# Patient Record
Sex: Male | Born: 1960 | Race: Black or African American | Hispanic: No | Marital: Married | Smoking: Former smoker
Health system: Southern US, Community
[De-identification: ages and names within clinical notes are randomized; demographics above are authoritative.]

## PROBLEM LIST (undated history)

## (undated) DIAGNOSIS — E119 Type 2 diabetes mellitus without complications: Secondary | ICD-10-CM

## (undated) DIAGNOSIS — F32A Depression, unspecified: Secondary | ICD-10-CM

## (undated) DIAGNOSIS — E669 Obesity, unspecified: Secondary | ICD-10-CM

## (undated) DIAGNOSIS — I2510 Atherosclerotic heart disease of native coronary artery without angina pectoris: Secondary | ICD-10-CM

## (undated) DIAGNOSIS — N471 Phimosis: Secondary | ICD-10-CM

## (undated) DIAGNOSIS — E785 Hyperlipidemia, unspecified: Secondary | ICD-10-CM

## (undated) DIAGNOSIS — Z9119 Patient's noncompliance with other medical treatment and regimen: Secondary | ICD-10-CM

## (undated) DIAGNOSIS — I5042 Chronic combined systolic (congestive) and diastolic (congestive) heart failure: Secondary | ICD-10-CM

## (undated) DIAGNOSIS — R079 Chest pain, unspecified: Secondary | ICD-10-CM

## (undated) DIAGNOSIS — F329 Major depressive disorder, single episode, unspecified: Secondary | ICD-10-CM

## (undated) DIAGNOSIS — I428 Other cardiomyopathies: Secondary | ICD-10-CM

## (undated) DIAGNOSIS — Z91199 Patient's noncompliance with other medical treatment and regimen due to unspecified reason: Secondary | ICD-10-CM

## (undated) DIAGNOSIS — I10 Essential (primary) hypertension: Secondary | ICD-10-CM

## (undated) DIAGNOSIS — Z87891 Personal history of nicotine dependence: Secondary | ICD-10-CM

## (undated) DIAGNOSIS — R0683 Snoring: Secondary | ICD-10-CM

## (undated) DIAGNOSIS — K219 Gastro-esophageal reflux disease without esophagitis: Secondary | ICD-10-CM

## (undated) DIAGNOSIS — G4733 Obstructive sleep apnea (adult) (pediatric): Secondary | ICD-10-CM

## (undated) HISTORY — DX: Hyperlipidemia, unspecified: E78.5

## (undated) HISTORY — PX: KNEE SURGERY: SHX244

## (undated) HISTORY — DX: Atherosclerotic heart disease of native coronary artery without angina pectoris: I25.10

## (undated) HISTORY — DX: Patient's noncompliance with other medical treatment and regimen: Z91.19

## (undated) HISTORY — DX: Type 2 diabetes mellitus without complications: E11.9

## (undated) HISTORY — DX: Chronic combined systolic (congestive) and diastolic (congestive) heart failure: I50.42

## (undated) HISTORY — DX: Patient's noncompliance with other medical treatment and regimen due to unspecified reason: Z91.199

## (undated) HISTORY — PX: TOTAL KNEE ARTHROPLASTY: SHX125

## (undated) HISTORY — PX: JOINT REPLACEMENT: SHX530

## (undated) HISTORY — DX: Other cardiomyopathies: I42.8

---
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
## 2008-04-28 ENCOUNTER — Encounter: Admission: RE | Admit: 2008-04-28 | Discharge: 2008-04-28 | Payer: Self-pay | Admitting: Occupational Medicine

## 2008-05-14 ENCOUNTER — Emergency Department (HOSPITAL_COMMUNITY): Admission: EM | Admit: 2008-05-14 | Discharge: 2008-05-14 | Payer: Self-pay | Admitting: Emergency Medicine

## 2008-06-10 ENCOUNTER — Emergency Department (HOSPITAL_COMMUNITY): Admission: EM | Admit: 2008-06-10 | Discharge: 2008-06-10 | Payer: Self-pay | Admitting: Emergency Medicine

## 2009-12-05 ENCOUNTER — Emergency Department (HOSPITAL_COMMUNITY): Admission: EM | Admit: 2009-12-05 | Discharge: 2009-12-05 | Payer: Self-pay | Admitting: Emergency Medicine

## 2011-03-21 LAB — URINALYSIS, ROUTINE W REFLEX MICROSCOPIC
Glucose, UA: NEGATIVE mg/dL
Ketones, ur: NEGATIVE mg/dL
Specific Gravity, Urine: 1.027 (ref 1.005–1.030)
pH: 6.5 (ref 5.0–8.0)

## 2011-12-09 ENCOUNTER — Ambulatory Visit (INDEPENDENT_AMBULATORY_CARE_PROVIDER_SITE_OTHER): Payer: BC Managed Care – PPO

## 2011-12-09 DIAGNOSIS — J019 Acute sinusitis, unspecified: Secondary | ICD-10-CM

## 2011-12-09 DIAGNOSIS — R05 Cough: Secondary | ICD-10-CM

## 2011-12-09 DIAGNOSIS — J111 Influenza due to unidentified influenza virus with other respiratory manifestations: Secondary | ICD-10-CM

## 2012-08-17 ENCOUNTER — Encounter (HOSPITAL_COMMUNITY): Payer: Self-pay

## 2012-08-17 ENCOUNTER — Emergency Department (INDEPENDENT_AMBULATORY_CARE_PROVIDER_SITE_OTHER): Payer: Self-pay

## 2012-08-17 ENCOUNTER — Emergency Department (INDEPENDENT_AMBULATORY_CARE_PROVIDER_SITE_OTHER)
Admission: EM | Admit: 2012-08-17 | Discharge: 2012-08-17 | Disposition: A | Discharge status: Home / Self Care | Payer: Worker's Compensation | Source: Home / Self Care | Attending: Emergency Medicine | Admitting: Emergency Medicine

## 2012-08-17 DIAGNOSIS — T148XXA Other injury of unspecified body region, initial encounter: Secondary | ICD-10-CM

## 2012-08-17 DIAGNOSIS — M1710 Unilateral primary osteoarthritis, unspecified knee: Secondary | ICD-10-CM

## 2012-08-17 HISTORY — DX: Essential (primary) hypertension: I10

## 2012-08-17 MED ORDER — HYDROCODONE-ACETAMINOPHEN 5-325 MG PO TABS
ORAL_TABLET | ORAL | Status: AC
  Filled 2012-08-17: qty 2

## 2012-08-17 MED ORDER — MELOXICAM 7.5 MG PO TABS: 7.5000 mg | ORAL_TABLET | Freq: Every day | ORAL | Status: AC

## 2012-08-17 MED ORDER — TRAMADOL HCL 50 MG PO TABS: 50.0000 mg | ORAL_TABLET | Freq: Four times a day (QID) | ORAL | Status: AC | PRN

## 2012-08-17 MED ORDER — HYDROCODONE-ACETAMINOPHEN 5-325 MG PO TABS
2.0000 | ORAL_TABLET | Freq: Once | ORAL | Status: AC
  Administered 2012-08-17: 2 via ORAL

## 2012-08-17 NOTE — ED Notes (Signed)
Discussion for W/C process

## 2012-08-17 NOTE — ED Provider Notes (Signed)
History     CSN: [id number]  Arrival date & time 08/17/12  1156   First MD Initiated Contact with Patient 08/17/12 1402      Chief Complaint  Patient presents with  . Knee Injury    (Consider location/radiation/quality/duration/timing/severity/associated sxs/prior treatment) HPI Comments: Patient presents  urgent care this afternoon, complaining of left anterior knee pain. He described that he was chasing a pupil at school today when he suddenly started feeling pain on his knee. Denies any falls, but believes he might have twisted his knee while he was almost running. Patient describes a throbbing constant pain (points to anterior and suprapatellar region). Denies any numbness, or weakness or his knee giving out, or popping noises or sensations. This has significant pain on weight-bearing activities, even for just walking.  The history is provided by the patient.    Past Medical History  Diagnosis Date  . Hypertension     Past Surgical History  Procedure Date  . Knee surgery     History reviewed. No pertinent family history.  History  Substance Use Topics  . Smoking status: Not on file  . Smokeless tobacco: Not on file  . Alcohol Use:       Review of Systems  Constitutional: Positive for activity change. Negative for fever, chills, diaphoresis and fatigue.  Cardiovascular: Negative for leg swelling.  Musculoskeletal: Negative for myalgias, back pain, joint swelling and arthralgias.       Left knee pain  Skin: Negative for color change, pallor and rash.  Neurological: Negative for dizziness, syncope, speech difficulty, light-headedness, numbness and headaches.    Allergies  Review of patient's allergies indicates no known allergies.  Home Medications   Current Outpatient Rx  Name Route Sig Dispense Refill  . MELOXICAM 7.5 MG PO TABS Oral Take 1 tablet (7.5 mg total) by mouth daily. 14 tablet 0  . TRAMADOL HCL 50 MG PO TABS Oral Take 1 tablet (50 mg total) by  mouth every 6 (six) hours as needed for pain. 15 tablet 0    BP 182/117  Pulse 66  Temp 98.1 F (36.7 C) (Oral)  Resp 20  SpO2 98%  Physical Exam  Nursing note and vitals reviewed. Constitutional: He appears well-developed. He appears distressed.  Abdominal: He exhibits no distension. There is no tenderness.  Musculoskeletal: He exhibits tenderness. He exhibits no edema.       Left knee: He exhibits bony tenderness. He exhibits normal range of motion, no swelling, no deformity, normal meniscus and no MCL laxity. tenderness found. Medial joint line, lateral joint line, MCL and LCL tenderness noted. No patellar tendon tenderness noted.       Legs: Neurological: He is alert.  Skin: Skin is warm. No rash noted. No erythema.    ED Course  Procedures (including critical care time)  Labs Reviewed - No data to display Dg Knee Complete 4 Views Left  08/17/2012  *RADIOLOGY REPORT*  Clinical Data: Injured left knee while running, pain and swelling. Remote left knee surgery.  LEFT KNEE - COMPLETE 4+ VIEW  Comparison: None.  Findings: No evidence of acute fracture or dislocation.  Moderate to severe medial compartment joint space narrowing, mild lateral patellofemoral joint space narrowing, and associated hypertrophic spurring.  Well-preserved bone mineral density.  No visible joint effusion.  IMPRESSION: No acute osseous abnormality.  Tricompartment osteoarthritis, worst in the medial compartment.   Original Report Authenticated By: Arnell Sieving, M.D.        MDM  Patient with  moderate to severe tricompartmental osteoarthritis. Reason for urgent care visit related to a recent sprain or strain at school. Patient has significant discomfort is provided a knee sleeve and crutches to avoid weight-bearing activities for the next 48 hours. Prescribe meloxicam course of 10-14 days and 10 tablets of Ultram for breakthrough pain management. I have discussed with patient that he needs to followup with  workers comp on Tuesday if discomfort prevents him from reintegrating to his work activity. Have advised him to consult with an orthopedic doctor pain was persistent in 2 weeks for further evaluation and imaging. Exam her x-rays were not suggestive of a significant meniscal or ligament tear although patient can have a minor intracompartmental associated injury.        Jimmie Molly, MD 08/17/12 [phone]

## 2012-08-17 NOTE — ED Notes (Addendum)
States he was chasing a pupil at school today, and injured left knee as a result: previous history of knee surgery

## 2012-08-21 ENCOUNTER — Ambulatory Visit (INDEPENDENT_AMBULATORY_CARE_PROVIDER_SITE_OTHER): Payer: BC Managed Care – PPO | Admitting: Internal Medicine

## 2012-08-21 VITALS — BP 172/102 | HR 81 | Temp 98.4°F | Resp 16 | Ht 68.0 in | Wt 226.0 lb

## 2012-08-21 DIAGNOSIS — M1710 Unilateral primary osteoarthritis, unspecified knee: Secondary | ICD-10-CM

## 2012-08-21 DIAGNOSIS — M25569 Pain in unspecified knee: Secondary | ICD-10-CM

## 2012-08-21 DIAGNOSIS — S83419A Sprain of medial collateral ligament of unspecified knee, initial encounter: Secondary | ICD-10-CM

## 2012-08-21 DIAGNOSIS — M1712 Unilateral primary osteoarthritis, left knee: Secondary | ICD-10-CM | POA: Insufficient documentation

## 2012-08-21 DIAGNOSIS — L909 Atrophic disorder of skin, unspecified: Secondary | ICD-10-CM

## 2012-08-21 DIAGNOSIS — I10 Essential (primary) hypertension: Secondary | ICD-10-CM | POA: Insufficient documentation

## 2012-08-21 DIAGNOSIS — L918 Other hypertrophic disorders of the skin: Secondary | ICD-10-CM

## 2012-08-21 DIAGNOSIS — Z6834 Body mass index (BMI) 34.0-34.9, adult: Secondary | ICD-10-CM | POA: Insufficient documentation

## 2012-08-21 DIAGNOSIS — R739 Hyperglycemia, unspecified: Secondary | ICD-10-CM

## 2012-08-21 LAB — POCT CBC
Lymph, poc: 2 (ref 0.6–3.4)
MCH, POC: 25.6 pg — AB (ref 27–31.2)
MCHC: 30.9 g/dL — AB (ref 31.8–35.4)
MCV: 82.9 fL (ref 80–97)
MID (cbc): 0.4 (ref 0–0.9)
POC LYMPH PERCENT: 38.6 %L (ref 10–50)
Platelet Count, POC: 252 10*3/uL (ref 142–424)
RDW, POC: 16.3 %
WBC: 5.3 10*3/uL (ref 4.6–10.2)

## 2012-08-21 MED ORDER — LISINOPRIL-HYDROCHLOROTHIAZIDE 10-12.5 MG PO TABS: 1.0000 | ORAL_TABLET | Freq: Every day | ORAL | Status: DC

## 2012-08-21 NOTE — Progress Notes (Signed)
10 skin tags removed with iris scissors.  Patient tolerated well. Hemostasis obtained with silver nitrate.

## 2012-08-21 NOTE — Progress Notes (Signed)
  Subjective:    Patient ID: Thomas Yoder, male    DOB: 09/15/1961, 51 y.o.   MRN: [id number]  HPI No medical followup for a few years//history of hypertension and anemia Recent injury to left knee at work and still limping  Blood pressure known to be out of control   Social history-Works at [employer]  Review of Systems Skin-numerous tags HEENT-no vision changes/no respiratory problems Cardiovascular respiratory-no dyspnea on exertion, chest pain, palpitations GI-negative GU negative     Objective:   Physical Exam Blood pressure 172/102 BMI 34 Skin with multiple skin tags around neck and in both axilla and down midline of back HEENT clear No thyromegaly or lymphadenopathy Lungs clear Heart regular without murmur Abdomen supple Extremities no edema/full peripheral pulses/The left knee is swollen but without an effusion. There is decreased range of motion secondary to discomfort. He is tender along the medial joint line and he is tender with stressing the medial joint. No laxity Neurological intact       Results for orders placed in visit on 08/21/12  POCT CBC      Component Value Range   WBC 5.3  4.6 - 10.2 K/uL   Lymph, poc 2.0  0.6 - 3.4   POC LYMPH PERCENT 38.6  10 - 50 %L   MID (cbc) 0.4  0 - 0.9   POC MID % 6.9  0 - 12 %M   POC Granulocyte 2.9  2 - 6.9   Granulocyte percent 54.5  37 - 80 %G   RBC 5.93  4.69 - 6.13 M/uL   Hemoglobin 15.2  14.1 - 18.1 g/dL   HCT, POC 40.9  81.1 - 53.7 %   MCV 82.9  80 - 97 fL   MCH, POC 25.6 (*) 27 - 31.2 pg   MCHC 30.9 (*) 31.8 - 35.4 g/dL   RDW, POC 91.4     Platelet Count, POC 252  142 - 424 K/uL   MPV 9.4  0 - 99.8 fL   Procedure-larger skin tags removed by PA Marte/referred for others Assessment & Plan:   1. Essential hypertension, benign  POCT CBC, Comprehensive metabolic panel, Lipid panel, PSA  2. Knee pain    3. Knee MCL sprain    4. Cutaneous skin tags  Ambulatory referral to Dermatology  5. BMI  34.0-34.9,adult    6. Knee osteoarthritis     Meds ordered this encounter  Medications  . lisinopril-hydrochlorothiazide (PRINZIDE,ZESTORETIC) 10-12.5 MG per tablet    Sig: Take 1 tablet by mouth daily.    Dispense:  90 tablet    Refill:  3   B. Start blood pressure medicine/ideal weight 160 pounds or less/home blood pressures-if not controlled a one-month followup to change medication Notify results of labs Review of knee x-rays from urgent care visit at [facility] suggested he has underlying osteoarthritis of the knee/If he doesn't get complete resolution of his primary sprain injury then he may need injections for osteoarthritis

## 2012-08-22 LAB — COMPREHENSIVE METABOLIC PANEL
AST: 18 U/L (ref 0–37)
Alkaline Phosphatase: 99 U/L (ref 39–117)
BUN: 16 mg/dL (ref 6–23)
Creat: 1.09 mg/dL (ref 0.50–1.35)
Glucose, Bld: 132 mg/dL — ABNORMAL HIGH (ref 70–99)

## 2012-08-22 LAB — LIPID PANEL
HDL: 31 mg/dL — ABNORMAL LOW (ref >39)
Total CHOL/HDL Ratio: 6.8 Ratio
Triglycerides: 531 mg/dL — ABNORMAL HIGH (ref <150)

## 2012-08-22 LAB — PSA: PSA: 1.32 ng/mL (ref <=4.00)

## 2012-08-22 NOTE — Addendum Note (Signed)
Addended by: Fernande Bras on: 08/22/2012 10:04 AM   Modules accepted: Orders

## 2012-08-27 ENCOUNTER — Encounter: Payer: Self-pay | Admitting: *Deleted

## 2013-09-30 ENCOUNTER — Ambulatory Visit (INDEPENDENT_AMBULATORY_CARE_PROVIDER_SITE_OTHER): Payer: BC Managed Care – PPO | Admitting: Internal Medicine

## 2013-09-30 ENCOUNTER — Ambulatory Visit: Payer: BC Managed Care – PPO

## 2013-09-30 VITALS — BP 164/108 | HR 79 | Temp 99.0°F | Resp 18

## 2013-09-30 DIAGNOSIS — R0602 Shortness of breath: Secondary | ICD-10-CM

## 2013-09-30 DIAGNOSIS — I10 Essential (primary) hypertension: Secondary | ICD-10-CM

## 2013-09-30 DIAGNOSIS — R079 Chest pain, unspecified: Secondary | ICD-10-CM

## 2013-09-30 LAB — POCT CBC
Granulocyte percent: 52.3 %G (ref 37–80)
MCV: 83.9 fL (ref 80–97)
MID (cbc): 0.3 (ref 0–0.9)
POC Granulocyte: 2.8 (ref 2–6.9)
Platelet Count, POC: 188 10*3/uL (ref 142–424)
RBC: 5.13 M/uL (ref 4.69–6.13)
RDW, POC: 15.4 %

## 2013-09-30 MED ORDER — MELOXICAM 15 MG PO TABS: 15.0000 mg | ORAL_TABLET | Freq: Every day | ORAL | Status: DC

## 2013-09-30 MED ORDER — AMOXICILLIN 500 MG PO CAPS: 1000.0000 mg | ORAL_CAPSULE | Freq: Two times a day (BID) | ORAL | Status: AC

## 2013-09-30 MED ORDER — KETOROLAC TROMETHAMINE 60 MG/2ML IM SOLN
60.0000 mg | Freq: Once | INTRAMUSCULAR | Status: AC
  Administered 2013-09-30: 60 mg via INTRAMUSCULAR

## 2013-09-30 MED ORDER — HYDROCODONE-HOMATROPINE 5-1.5 MG/5ML PO SYRP: 5.0000 mL | ORAL_SOLUTION | Freq: Four times a day (QID) | ORAL | Status: DC | PRN

## 2013-09-30 MED ORDER — LISINOPRIL-HYDROCHLOROTHIAZIDE 10-12.5 MG PO TABS: 1.0000 | ORAL_TABLET | Freq: Every day | ORAL | Status: DC

## 2013-09-30 NOTE — Progress Notes (Signed)
Subjective:    Patient ID: Thomas Yoder, male    DOB: [birth year], 52 y.o.   MRN: [id number]  HPIcalled by staff to room 7 for emergency--chest pain w/ sob. Found him in pain clutching chest and c/o sob- both present for 3 days intermittently. Has been working today until they convinced him to come here.  Pain is L ant chest and gets worse w/ cough , twisting, deep breathing. No diaphor/nausea/vom/diaphoresis Hx HTN-ran out of meds Exam/EKG suggested this was not emergent MI so w/u proceeded from that point  Has been coughing w/ sputum for 3 days . Started w/ cold/congestion last week. Fever yesterday. In bed this weeked tho hard to sleep due to chest pain. No st//  Patient Active Problem List   Diagnosis Date Noted  . Essential hypertension, benign 08/21/2012  . BMI 34.0-34.9,adult 08/21/2012  . Knee osteoarthritis 08/21/2012   Current outpatient prescriptions:  lisinopril-hydrochlorothiazide (PRINZIDE,ZESTORETIC) 10-12.5 MG per tablet, Take 1 tablet by mouth daily--OUT   Review of Systems  Constitutional: Negative for diaphoresis, activity change, appetite change and unexpected weight change.  HENT: Positive for postnasal drip, rhinorrhea, sinus pressure and sneezing.   Respiratory: Positive for cough, chest tightness and shortness of breath. Negative for choking and wheezing.   Cardiovascular: Positive for chest pain. Negative for palpitations and leg swelling.  Gastrointestinal: Negative for nausea and abdominal pain.  Musculoskeletal: Negative for joint swelling and neck pain.       Reports pain L scap border 2-3d as well       Objective:   Physical Exam  Constitutional: He is oriented to person, place, and time. He appears well-developed. He appears distressed.  Uncomfortable Given toradol 60 after nl ekg  HENT:  Mouth/Throat: Oropharynx is clear and moist.  Purulent sinus dr/tender max areas  Eyes: EOM are normal. Pupils are equal, round, and reactive to light. Right  eye exhibits no discharge. No scleral icterus.  Conj injected  Neck: No thyromegaly present.  Neck extens and flex exacerb pain L scap border  Cardiovascular: Normal rate, regular rhythm, normal heart sounds and intact distal pulses.  Exam reveals no gallop and no friction rub.   No murmur heard. Pulmonary/Chest: Effort normal and breath sounds normal. He has no wheezes. He has no rales. He exhibits no tenderness.  Pain w/ deep br L ant Tender to pal(pation L upper ant chest wall  Abdominal: Soft. Bowel sounds are normal. He exhibits no mass. There is no tenderness. There is no rebound and no guarding.  No HSmeg  Musculoskeletal: He exhibits no edema.  Lymphadenopathy:    He has no cervical adenopathy.  Neurological: He is alert and oriented to person, place, and time. No cranial nerve deficit.  Psychiatric: Judgment normal.  Apprehensive/uncomfortable   BP 164/108  Pulse 79  Temp(Src) 99 F (37.2 C) (Oral)  Resp 18  SpO2 98%    [facility] reading (PRIMARY) by  Dr. Doolittle=No active infiltrate  EKG no acute injury  Results for orders placed in visit on 09/30/13  POCT CBC      Result Value Range   WBC 5.3  4.6 - 10.2 K/uL   Lymph, poc 2.2  0.6 - 3.4   POC LYMPH PERCENT 41.5  10 - 50 %L   MID (cbc) 0.3  0 - 0.9   POC MID % 6.2  0 - 12 %M   POC Granulocyte 2.8  2 - 6.9   Granulocyte percent 52.3  37 - 80 %G   RBC  5.13  4.69 - 6.13 M/uL   Hemoglobin 13.4 (*) 14.1 - 18.1 g/dL   HCT, POC 14.7 (*) 82.9 - 53.7 %   MCV 83.9  80 - 97 fL   MCH, POC 26.1 (*) 27 - 31.2 pg   MCHC 31.2 (*) 31.8 - 35.4 g/dL   RDW, POC 56.2     Platelet Count, POC 188  142 - 424 K/uL   MPV 8.9  0 - 99.8 fL   reexam after labs/xray/ekg=asleep--much more comf 30 min p toradol  Assessment & Plan:  Chest pain -chest wall muscle tear SOB (shortness of breath) - due to above Sinusitis causing cough/fever Unspecified essential hypertension-uncontr/off meds Overweight  Meds ordered this encounter   Medications  . HYDROcodone-homatropine (HYCODAN) 5-1.5 MG/5ML syrup    Sig: Take 5 mLs by mouth every 6 (six) hours as needed for cough.    Dispense:  120 mL    Refill:  0  . amoxicillin (AMOXIL) 500 MG capsule    Sig: Take 2 capsules (1,000 mg total) by mouth 2 (two) times daily.    Dispense:  40 capsule    Refill:  0  . meloxicam (MOBIC) 15 MG tablet    Sig: Take 1 tablet (15 mg total) by mouth daily. For chest wall pain.    Dispense:  15 tablet    Refill:  0  . lisinopril-hydrochlorothiazide (PRINZIDE,ZESTORETIC) 10-12.5 MG per tablet    Sig: Take 1 tablet by mouth daily.    Dispense:  90 tablet    Refill:  3---------restarted  Reck 3-5 d sooner if worse Ck cmet Needs CPE

## 2013-10-01 ENCOUNTER — Encounter: Payer: Self-pay | Admitting: Internal Medicine

## 2013-10-01 LAB — COMPREHENSIVE METABOLIC PANEL
ALT: 14 U/L (ref 0–53)
AST: 15 U/L (ref 0–37)
Albumin: 3.9 g/dL (ref 3.5–5.2)
Alkaline Phosphatase: 95 U/L (ref 39–117)
BUN: 13 mg/dL (ref 6–23)
Calcium: 9.3 mg/dL (ref 8.4–10.5)
Chloride: 106 mEq/L (ref 96–112)
Potassium: 4.3 mEq/L (ref 3.5–5.3)
Sodium: 139 mEq/L (ref 135–145)
Total Protein: 6.6 g/dL (ref 6.0–8.3)

## 2013-10-03 ENCOUNTER — Emergency Department (HOSPITAL_COMMUNITY): Payer: BC Managed Care – PPO

## 2013-10-03 ENCOUNTER — Encounter (HOSPITAL_COMMUNITY): Payer: Self-pay | Admitting: Emergency Medicine

## 2013-10-03 ENCOUNTER — Ambulatory Visit (INDEPENDENT_AMBULATORY_CARE_PROVIDER_SITE_OTHER): Payer: BC Managed Care – PPO | Admitting: Emergency Medicine

## 2013-10-03 ENCOUNTER — Emergency Department (HOSPITAL_COMMUNITY)
Admission: EM | Admit: 2013-10-03 | Discharge: 2013-10-03 | Disposition: A | Discharge status: Home / Self Care | Payer: BC Managed Care – PPO | Attending: Emergency Medicine | Admitting: Emergency Medicine

## 2013-10-03 VITALS — BP 168/110 | HR 66 | Temp 98.1°F | Resp 18

## 2013-10-03 DIAGNOSIS — F17200 Nicotine dependence, unspecified, uncomplicated: Secondary | ICD-10-CM | POA: Insufficient documentation

## 2013-10-03 DIAGNOSIS — R0789 Other chest pain: Secondary | ICD-10-CM

## 2013-10-03 DIAGNOSIS — J029 Acute pharyngitis, unspecified: Secondary | ICD-10-CM | POA: Insufficient documentation

## 2013-10-03 DIAGNOSIS — IMO0001 Reserved for inherently not codable concepts without codable children: Secondary | ICD-10-CM | POA: Insufficient documentation

## 2013-10-03 DIAGNOSIS — Z792 Long term (current) use of antibiotics: Secondary | ICD-10-CM | POA: Insufficient documentation

## 2013-10-03 DIAGNOSIS — Z791 Long term (current) use of non-steroidal anti-inflammatories (NSAID): Secondary | ICD-10-CM | POA: Insufficient documentation

## 2013-10-03 DIAGNOSIS — R079 Chest pain, unspecified: Secondary | ICD-10-CM

## 2013-10-03 DIAGNOSIS — R0602 Shortness of breath: Secondary | ICD-10-CM

## 2013-10-03 DIAGNOSIS — J3489 Other specified disorders of nose and nasal sinuses: Secondary | ICD-10-CM | POA: Insufficient documentation

## 2013-10-03 DIAGNOSIS — J40 Bronchitis, not specified as acute or chronic: Secondary | ICD-10-CM

## 2013-10-03 DIAGNOSIS — J209 Acute bronchitis, unspecified: Secondary | ICD-10-CM | POA: Insufficient documentation

## 2013-10-03 DIAGNOSIS — I10 Essential (primary) hypertension: Secondary | ICD-10-CM | POA: Insufficient documentation

## 2013-10-03 DIAGNOSIS — Z79899 Other long term (current) drug therapy: Secondary | ICD-10-CM | POA: Insufficient documentation

## 2013-10-03 DIAGNOSIS — R9431 Abnormal electrocardiogram [ECG] [EKG]: Secondary | ICD-10-CM

## 2013-10-03 LAB — CBC WITH DIFFERENTIAL/PLATELET
Basophils Absolute: 0 10*3/uL (ref 0.0–0.1)
Basophils Relative: 1 % (ref 0–1)
Eosinophils Absolute: 0.1 10*3/uL (ref 0.0–0.7)
Eosinophils Relative: 2 % (ref 0–5)
HCT: 42.3 % (ref 39.0–52.0)
Hemoglobin: 14.3 g/dL (ref 13.0–17.0)
Lymphocytes Relative: 40 % (ref 12–46)
Lymphs Abs: 2.4 K/uL (ref 0.7–4.0)
MCH: 26.6 pg (ref 26.0–34.0)
MCHC: 33.8 g/dL (ref 30.0–36.0)
MCV: 78.8 fL (ref 78.0–100.0)
Monocytes Absolute: 0.4 K/uL (ref 0.1–1.0)
Monocytes Relative: 6 % (ref 3–12)
Neutro Abs: 3.1 10*3/uL (ref 1.7–7.7)
Neutrophils Relative %: 52 % (ref 43–77)
Platelets: 193 10*3/uL (ref 150–400)
RBC: 5.37 MIL/uL (ref 4.22–5.81)
RDW: 15.2 % (ref 11.5–15.5)
WBC: 6 K/uL (ref 4.0–10.5)

## 2013-10-03 LAB — BASIC METABOLIC PANEL
BUN: 16 mg/dL (ref 6–23)
Chloride: 105 mEq/L (ref 96–112)
GFR calc Af Amer: >90 mL/min (ref >90)
GFR calc non Af Amer: >90 mL/min (ref >90)
Potassium: 3.6 mEq/L (ref 3.5–5.1)
Sodium: 140 mEq/L (ref 135–145)

## 2013-10-03 LAB — BASIC METABOLIC PANEL WITH GFR
CO2: 24 meq/L (ref 19–32)
Calcium: 8.9 mg/dL (ref 8.4–10.5)
Creatinine, Ser: 0.79 mg/dL (ref 0.50–1.35)
Glucose, Bld: 87 mg/dL (ref 70–99)

## 2013-10-03 LAB — POCT I-STAT TROPONIN I: Troponin i, poc: 0 ng/mL (ref 0.00–0.08)

## 2013-10-03 MED ORDER — HYDROCOD POLST-CHLORPHEN POLST 10-8 MG/5ML PO LQCR
5.0000 mL | Freq: Once | ORAL | Status: AC
  Administered 2013-10-03: 5 mL via ORAL
  Filled 2013-10-03: qty 5

## 2013-10-03 NOTE — Discharge Instructions (Signed)
Bronchitis Bronchitis is the body's way of reacting to injury and/or infection (inflammation) of the bronchi. Bronchi are the air tubes that extend from the windpipe into the lungs. If the inflammation becomes severe, it may cause shortness of breath. CAUSES  Inflammation may be caused by:  A virus.  Germs (bacteria).  Dust.  Allergens.  Pollutants and many other irritants. The cells lining the bronchial tree are covered with tiny hairs (cilia). These constantly beat upward, away from the lungs, toward the mouth. This keeps the lungs free of pollutants. When these cells become too irritated and are unable to do their job, mucus begins to develop. This causes the characteristic cough of bronchitis. The cough clears the lungs when the cilia are unable to do their job. Without either of these protective mechanisms, the mucus would settle in the lungs. Then you would develop pneumonia. Smoking is a common cause of bronchitis and can contribute to pneumonia. Stopping this habit is the single most important thing you can do to help yourself. TREATMENT   Your caregiver may prescribe an antibiotic if the cough is caused by bacteria. Also, medicines that open up your airways make it easier to breathe. Your caregiver may also recommend or prescribe an expectorant. It will loosen the mucus to be coughed up. Only take over-the-counter or prescription medicines for pain, discomfort, or fever as directed by your caregiver.  Removing whatever causes the problem (smoking, for example) is critical to preventing the problem from getting worse.  Cough suppressants may be prescribed for relief of cough symptoms.  Inhaled medicines may be prescribed to help with symptoms now and to help prevent problems from returning.  For those with recurrent (chronic) bronchitis, there may be a need for steroid medicines. SEEK IMMEDIATE MEDICAL CARE IF:   During treatment, you develop more pus-like mucus (purulent  sputum).  You have a fever.  Your baby is older than 3 months with a rectal temperature of 102 F (38.9 C) or higher.  Your baby is 3 months old or younger with a rectal temperature of 100.4 F (38 C) or higher.  You become progressively more ill.  You have increased difficulty breathing, wheezing, or shortness of breath. It is necessary to seek immediate medical care if you are elderly or sick from any other disease. MAKE SURE YOU:   Understand these instructions.  Will watch your condition.  Will get help right away if you are not doing well or get worse. Document Released: 12/05/2005 Document Revised: 02/27/2012 Document Reviewed: 10/14/2008 Exit[employer] Patient Information 2014 [employer].  

## 2013-10-03 NOTE — ED Notes (Addendum)
Pt to ED for eval of CP onset last Friday (7 days ago) - L chest with radiation to jaw and back.  Went to PCP Tuesday and was dx with musculoskeletal pain and was given a shot which helped temporarily but then the pain returned. Today at work CP increases so his coworkers took him to [facility]. Also hypertensive with hx of same. Ran out of BP meds, recently got rx refilled but has not started taking them yet.  Last bp for ems 165/119 after 2 SL NTG, was initially 197/116. [facility] administered 324 ASA.

## 2013-10-03 NOTE — ED Provider Notes (Addendum)
CSN: [id number]     Arrival date & time 10/03/13  1153 History   First MD Initiated Contact with Patient 10/03/13 1201     Chief Complaint  Patient presents with  . Chest Pain  . Hypertension   (Consider location/radiation/quality/duration/timing/severity/associated sxs/prior Treatment) HPI Comments: Pt was seen by PCP Dr. Merla Riches and given Rx fro Mobic, amoxicillin and tussionex, but he did not fill Tussionex thinking only the Mobic was for pain.    Patient is a 52 y.o. male presenting with chest pain and hypertension. The history is provided by the patient and a relative.  Chest Pain Pain location:  L chest Pain quality: aching and sharp   Pain radiates to:  L shoulder Pain radiates to the back: no   Pain severity:  Moderate Onset quality:  Gradual Duration:  5 days Timing:  Intermittent Progression:  Worsening Chronicity:  New Relieved by:  Nothing Worsened by:  Movement Ineffective treatments: oral antibiotics and Mobic. Associated symptoms: cough   Associated symptoms: no abdominal pain, no fever, no nausea, no shortness of breath and not vomiting   Associated symptoms comment:  Nasal congestion, sinus pressure, sore throat Hypertension Associated symptoms include chest pain. Pertinent negatives include no abdominal pain and no shortness of breath.    Past Medical History  Diagnosis Date  . Hypertension    Past Surgical History  Procedure Laterality Date  . Knee surgery     History reviewed. No pertinent family history. History  Substance Use Topics  . Smoking status: Current Every Day Smoker  . Smokeless tobacco: Not on file  . Alcohol Use: Not on file    Review of Systems  Constitutional: Negative for fever and chills.  HENT: Positive for congestion, rhinorrhea, sinus pressure and sore throat.   Respiratory: Positive for cough. Negative for shortness of breath and wheezing.   Cardiovascular: Positive for chest pain.  Gastrointestinal: Negative for  nausea, vomiting, abdominal pain and diarrhea.  Genitourinary: Negative for dysuria and flank pain.  Musculoskeletal: Positive for myalgias.  All other systems reviewed and are negative.    Allergies  Review of patient's allergies indicates no known allergies.  Home Medications   Current Outpatient Rx  Name  Route  Sig  Dispense  Refill  . amoxicillin (AMOXIL) 500 MG capsule   Oral   Take 2 capsules (1,000 mg total) by mouth 2 (two) times daily.   40 capsule   0   . meloxicam (MOBIC) 15 MG tablet   Oral   Take 1 tablet (15 mg total) by mouth daily. For chest wall pain.   15 tablet   0   . HYDROcodone-homatropine (HYCODAN) 5-1.5 MG/5ML syrup   Oral   Take 5 mLs by mouth every 6 (six) hours as needed for cough.   120 mL   0   . lisinopril-hydrochlorothiazide (PRINZIDE,ZESTORETIC) 10-12.5 MG per tablet   Oral   Take 1 tablet by mouth daily.   90 tablet   3    BP 144/106  Pulse 76  Temp(Src) 97.7 F (36.5 C) (Oral)  Resp 12  SpO2 95% Physical Exam  Nursing note and vitals reviewed. Constitutional: He is oriented to person, place, and time. He appears well-developed and well-nourished. No distress.  HENT:  Head: Normocephalic and atraumatic.  Eyes: Conjunctivae and EOM are normal. No scleral icterus.  Neck: Normal range of motion. Neck supple. No JVD present.  Cardiovascular: Normal rate, regular rhythm and intact distal pulses.   No murmur heard. Pulmonary/Chest: Effort  normal. No respiratory distress. He has no wheezes. He has no rales. He exhibits tenderness.  Abdominal: Soft. Bowel sounds are normal. He exhibits no distension. There is no tenderness. There is no rebound.  Musculoskeletal: He exhibits no edema.  Neurological: He is alert and oriented to person, place, and time.  Skin: Skin is warm and dry. No rash noted. He is not diaphoretic.  Psychiatric: He has a normal mood and affect.    ED Course  Procedures (including critical care time) Labs  Review Labs Reviewed  CBC WITH DIFFERENTIAL  BASIC METABOLIC PANEL  POCT I-STAT TROPONIN I   Imaging Review Dg Chest 2 View  10/03/2013   CLINICAL DATA:  Left-sided chest pain and cough for 1 week.  EXAM: CHEST  2 VIEW  COMPARISON:  Single view of the chest 0 04/28/2008.  FINDINGS: The lungs are clear. Heart size is upper normal. No pneumothorax or pleural effusion. No focal bony abnormality.  IMPRESSION: No acute disease.   Electronically Signed   By: Drusilla Kanner M.D.   On: 10/03/2013 14:11    EKG Interpretation   None      ra sat is 96% and I interpret to be adequate  ECG at time 13:25 shows SR at rate 62, normal axis, non specific T wave abn's lateral leads, with more T wave inversions compared to ECG on 09/30/13.    2:57 PM I reviewed CXR 2 view, no infiltrates.  Labs are unremarkable, troponin is 0.00, and historically, seems highly atypical for ACS givne URI symptoms and sharp, intermittent CP's.  Recomnend getting tussionex filled and continuing current abx therapy, follow up with [facility] next week and will also refer to cardiology.    MDM   1. Bronchitis   2. Atypical chest pain     Pt with somewhat atypical and reproducible chest wall pain on left, no prior h/o cardiac, sharp pains, current ongoing URI symptoms and cough.  No sig PE risks.  Will get CXR, treat pain and obtain cardiac evaluation as well.  Pt's symptoms present for several days so serial troponins are not needed.        Gavin Pound. Yasamin Karel, MD 10/03/13 1500  Gavin Pound. Beryl Balz, MD 10/03/13 1501

## 2013-10-03 NOTE — Progress Notes (Signed)
  Subjective:    Patient ID: Thomas Yoder, male    DOB: 01/20/1961, 52 y.o.   MRN: [id number]  HPI patient states his pain started last Wednesday. It is a sharp pain which progresses to a throbbing type pain. The intensity varies between 5 and 10. He was seen 3 days ago. He was started on blood pressure medications. Chest x-ray showed no acute disease EKG showed nonspecific T changes. At work today he became short of breath with increasing pain on the left side to the point where he was about to cry. He states the pain is in the left lateral chest and extends up his left side of the back and into the neck area    Review of Systems     Objective:   Physical Exam patient is in no acute distress. His chest is clear. His heart is regular rate without murmurs. Abdomen is soft liver and spleen not large there are no areas of tenderness. Extremities are without edema  EKG shows lateral T-wave inversion with signs of left ventricular hypertrophy no ST elevation was seen       Assessment & Plan:  Go ahead and sent to the hospital for enzymes and d-dimer. His pulse ox here was good he had no signs of DVT. Previous chest x-ray was read as normal. I suspect he probably has underlying sleep apnea.

## 2014-02-12 ENCOUNTER — Ambulatory Visit: Payer: BC Managed Care – PPO

## 2014-02-12 ENCOUNTER — Ambulatory Visit (INDEPENDENT_AMBULATORY_CARE_PROVIDER_SITE_OTHER): Payer: BC Managed Care – PPO | Admitting: Emergency Medicine

## 2014-02-12 VITALS — BP 152/98 | HR 80 | Temp 98.2°F | Resp 17 | Ht 67.0 in | Wt 214.0 lb

## 2014-02-12 DIAGNOSIS — R002 Palpitations: Secondary | ICD-10-CM

## 2014-02-12 DIAGNOSIS — I10 Essential (primary) hypertension: Secondary | ICD-10-CM

## 2014-02-12 DIAGNOSIS — I517 Cardiomegaly: Secondary | ICD-10-CM

## 2014-02-12 LAB — COMPLETE METABOLIC PANEL WITH GFR
ALT: 12 U/L (ref 0–53)
AST: 12 U/L (ref 0–37)
Albumin: 4.1 g/dL (ref 3.5–5.2)
Alkaline Phosphatase: 86 U/L (ref 39–117)
BUN: 14 mg/dL (ref 6–23)
CO2: 27 mEq/L (ref 19–32)
CREATININE: 0.91 mg/dL (ref 0.50–1.35)
Calcium: 9.5 mg/dL (ref 8.4–10.5)
Chloride: 106 mEq/L (ref 96–112)
GFR, Est African American: >89 mL/min
GFR, Est Non African American: >89 mL/min
Glucose, Bld: 109 mg/dL — ABNORMAL HIGH (ref 70–99)
POTASSIUM: 4.5 meq/L (ref 3.5–5.3)
Sodium: 140 mEq/L (ref 135–145)
Total Bilirubin: 0.3 mg/dL (ref 0.2–1.2)
Total Protein: 6.8 g/dL (ref 6.0–8.3)

## 2014-02-12 LAB — POCT CBC
Granulocyte percent: 59.2 %G (ref 37–80)
HCT, POC: 48 % (ref 43.5–53.7)
HEMOGLOBIN: 14.5 g/dL (ref 14.1–18.1)
Lymph, poc: 2 (ref 0.6–3.4)
MCH, POC: 25.8 pg — AB (ref 27–31.2)
MCHC: 30.2 g/dL — AB (ref 31.8–35.4)
MCV: 85.3 fL (ref 80–97)
MID (CBC): 0.4 (ref 0–0.9)
MPV: 9.2 fL (ref 0–99.8)
POC GRANULOCYTE: 3.5 (ref 2–6.9)
POC LYMPH PERCENT: 33.8 %L (ref 10–50)
POC MID %: 7 % (ref 0–12)
Platelet Count, POC: 236 10*3/uL (ref 142–424)
RBC: 5.63 M/uL (ref 4.69–6.13)
RDW, POC: 17.3 %
WBC: 5.9 10*3/uL (ref 4.6–10.2)

## 2014-02-12 LAB — GLUCOSE, POCT (MANUAL RESULT ENTRY): POC GLUCOSE: 105 mg/dL — AB (ref 70–99)

## 2014-02-12 MED ORDER — LISINOPRIL-HYDROCHLOROTHIAZIDE 10-12.5 MG PO TABS: 1.0000 | ORAL_TABLET | Freq: Every day | ORAL | Status: DC

## 2014-02-12 MED ORDER — METOPROLOL SUCCINATE ER 50 MG PO TB24: 50.0000 mg | ORAL_TABLET | Freq: Every day | ORAL | Status: DC

## 2014-02-12 NOTE — Patient Instructions (Signed)
Smoking Cessation Quitting smoking is important to your health and has many advantages. However, it is not always easy to quit since nicotine is a very addictive drug. Often times, people try 3 times or more before being able to quit. This document explains the best ways for you to prepare to quit smoking. Quitting takes hard work and a lot of effort, but you can do it. ADVANTAGES OF QUITTING SMOKING  You will live longer, feel better, and live better.  Your body will feel the impact of quitting smoking almost immediately.  Within 20 minutes, blood pressure decreases. Your pulse returns to its normal level.  After 8 hours, carbon monoxide levels in the blood return to normal. Your oxygen level increases.  After 24 hours, the chance of having a heart attack starts to decrease. Your breath, hair, and body stop smelling like smoke.  After 48 hours, damaged nerve endings begin to recover. Your sense of taste and smell improve.  After 72 hours, the body is virtually free of nicotine. Your bronchial tubes relax and breathing becomes easier.  After 2 to 12 weeks, lungs can hold more air. Exercise becomes easier and circulation improves.  The risk of having a heart attack, stroke, cancer, or lung disease is greatly reduced.  After 1 year, the risk of coronary heart disease is cut in half.  After 5 years, the risk of stroke falls to the same as a nonsmoker.  After 10 years, the risk of lung cancer is cut in half and the risk of other cancers decreases significantly.  After 15 years, the risk of coronary heart disease drops, usually to the level of a nonsmoker.  If you are pregnant, quitting smoking will improve your chances of having a healthy baby.  The people you live with, especially any children, will be healthier.  You will have extra money to spend on things other than cigarettes. QUESTIONS TO THINK ABOUT BEFORE ATTEMPTING TO QUIT You may want to talk about your answers with your  caregiver.  Why do you want to quit?  If you tried to quit in the past, what helped and what did not?  What will be the most difficult situations for you after you quit? How will you plan to handle them?  Who can help you through the tough times? Your family? Friends? A caregiver?  What pleasures do you get from smoking? What ways can you still get pleasure if you quit? Here are some questions to ask your caregiver:  How can you help me to be successful at quitting?  What medicine do you think would be best for me and how should I take it?  What should I do if I need more help?  What is smoking withdrawal like? How can I get information on withdrawal? GET READY  Set a quit date.  Change your environment by getting rid of all cigarettes, ashtrays, matches, and lighters in your home, car, or work. Do not let people smoke in your home.  Review your past attempts to quit. Think about what worked and what did not. GET SUPPORT AND ENCOURAGEMENT You have a better chance of being successful if you have help. You can get support in many ways.  Tell your family, friends, and co-workers that you are going to quit and need their support. Ask them not to smoke around you.  Get individual, group, or telephone counseling and support. Programs are available at local hospitals and health centers. Call your local health department for   information about programs in your area.  Spiritual beliefs and practices may help some smokers quit.  Download a "quit meter" on your computer to keep track of quit statistics, such as how long you have gone without smoking, cigarettes not smoked, and money saved.  Get a self-help book about quitting smoking and staying off of tobacco. LEARN NEW SKILLS AND BEHAVIORS  Distract yourself from urges to smoke. Talk to someone, go for a walk, or occupy your time with a task.  Change your normal routine. Take a different route to work. Drink tea instead of coffee.  Eat breakfast in a different place.  Reduce your stress. Take a hot bath, exercise, or read a book.  Plan something enjoyable to do every day. Reward yourself for not smoking.  Explore interactive web-based programs that specialize in helping you quit. GET MEDICINE AND USE IT CORRECTLY Medicines can help you stop smoking and decrease the urge to smoke. Combining medicine with the above behavioral methods and support can greatly increase your chances of successfully quitting smoking.  Nicotine replacement therapy helps deliver nicotine to your body without the negative effects and risks of smoking. Nicotine replacement therapy includes nicotine gum, lozenges, inhalers, nasal sprays, and skin patches. Some may be available over-the-counter and others require a prescription.  Antidepressant medicine helps people abstain from smoking, but how this works is unknown. This medicine is available by prescription.  Nicotinic receptor partial agonist medicine simulates the effect of nicotine in your brain. This medicine is available by prescription. Ask your caregiver for advice about which medicines to use and how to use them based on your health history. Your caregiver will tell you what side effects to look out for if you choose to be on a medicine or therapy. Carefully read the information on the package. Do not use any other product containing nicotine while using a nicotine replacement product.  RELAPSE OR DIFFICULT SITUATIONS Most relapses occur within the first 3 months after quitting. Do not be discouraged if you start smoking again. Remember, most people try several times before finally quitting. You may have symptoms of withdrawal because your body is used to nicotine. You may crave cigarettes, be irritable, feel very hungry, cough often, get headaches, or have difficulty concentrating. The withdrawal symptoms are only temporary. They are strongest when you first quit, but they will go away within  10 14 days. To reduce the chances of relapse, try to:  Avoid drinking alcohol. Drinking lowers your chances of successfully quitting.  Reduce the amount of caffeine you consume. Once you quit smoking, the amount of caffeine in your body increases and can give you symptoms, such as a rapid heartbeat, sweating, and anxiety.  Avoid smokers because they can make you want to smoke.  Do not let weight gain distract you. Many smokers will gain weight when they quit, usually less than 10 pounds. Eat a healthy diet and stay active. You can always lose the weight gained after you quit.  Find ways to improve your mood other than smoking. FOR MORE INFORMATION  www.smokefree.gov  Document Released: 11/29/2001 Document Revised: 06/05/2012 Document Reviewed: 03/15/2012 [employer] Patient Information 2014 [employer].  

## 2014-02-12 NOTE — Progress Notes (Addendum)
Subjective:    Patient ID: Thomas Yoder, male    DOB: 08-02-1961, 53 y.o.   MRN: [id number] This chart was scribed for Collene Gobble, MD by Valera Castle, ED Scribe. This patient was seen in room 08 and the patient's care was started at 1:13 PM.  Chief Complaint  Patient presents with  . Hypertension  . Dizziness  . Chest Pain   HPI Thomas Yoder is a 53 y.o. male Pt presents for HTN follow up, recent dizziness. He takes Lisinopril-HCTZ.   He reports taking his BP medication regularly, but feels like his medication is not working. He denies having been seen by anyone since being sent to hospital at last visit here. He reports he was feeling better afterwards, started exercising. He states the last 4 days he has felt light-headed, weakness, and has had palpitations, irregular heart beat. He reports SOB, but denies chest pain.   He also reports intermittent, pinching, right, mid back pain. He reports having to lift frequently at work.  He reports h/o smoking, 1.5 ppweek. He is requesting to be put on medication for smoking.   Review of Systems  Cardiovascular: Positive for palpitations. Negative for chest pain.  Musculoskeletal: Positive for back pain (mid, right).  Neurological: Positive for dizziness, weakness and light-headedness.    Patient Active Problem List   Diagnosis Date Noted  . Essential hypertension, benign 08/21/2012  . BMI 34.0-34.9,adult 08/21/2012  . Knee osteoarthritis 08/21/2012   Past Medical History  Diagnosis Date  . Hypertension    Past Surgical History  Procedure Laterality Date  . Knee surgery     No Known Allergies Prior to Admission medications   Medication Sig Start Date End Date Taking? Authorizing Provider  lisinopril-hydrochlorothiazide (PRINZIDE,ZESTORETIC) 10-12.5 MG per tablet Take 1 tablet by mouth daily. 09/30/13 09/30/14 Yes Tonye Pearson, MD      Objective:   Physical Exam CONSTITUTIONAL: Well developed/well nourished HEAD:  Normocephalic/atraumatic EYES: EOMI/PERRL ENMT: Mucous membranes moist NECK: supple no meningeal signs SPINE: entire spine nontender CV: S1/S2 noted, no murmurs/rubs/gallops noted LUNGS: Lungs CTAB, No apparent distress ABDOMEN: soft, nontender, no rebound or guarding GU: NEURO: Pt is awake/alert, moves all extremitiesx4 EXTREMITIES: pulses normal, full ROM SKIN: warm, color normal PSYCH: no abnormalities of mood noted  BP 152/98  Pulse 80  Temp(Src) 98.2 F (36.8 C) (Oral)  Resp 17  Ht 5\' 7"  (1.702 m)  Wt 214 lb (97.07 kg)  BMI 33.51 kg/m2  SpO2 99%  BP RECHECK: RIGHT ARM: 170/110  Results for orders placed in visit on 02/12/14  POCT CBC      Result Value Ref Range   WBC 5.9  4.6 - 10.2 K/uL   Lymph, poc 2.0  0.6 - 3.4   POC LYMPH PERCENT 33.8  10 - 50 %L   MID (cbc) 0.4  0 - 0.9   POC MID % 7.0  0 - 12 %M   POC Granulocyte 3.5  2 - 6.9   Granulocyte percent 59.2  37 - 80 %G   RBC 5.63  4.69 - 6.13 M/uL   Hemoglobin 14.5  14.1 - 18.1 g/dL   HCT, POC 57.3  22.0 - 53.7 %   MCV 85.3  80 - 97 fL   MCH, POC 25.8 (*) 27 - 31.2 pg   MCHC 30.2 (*) 31.8 - 35.4 g/dL   RDW, POC 25.4     Platelet Count, POC 236  142 - 424 K/uL   MPV 9.2  0 - 99.8 fL  GLUCOSE, POCT (MANUAL RESULT ENTRY)      Result Value Ref Range   POC Glucose 105 (*) 70 - 99 mg/dl  EKG LVH no acute changes seen [facility] reading (PRIMARY) by  Dr. Cleta Albertsaub no acute disease      Assessment & Plan:  Marland Kitchen. Appointment made with cardiology for their evaluation. Added Toprol-XL 50 to take a half a day for the first 3 days and then one a day. I advised him to try Nicotrol patches. Referral was made and I will see the patient back in one month .   **Disclaimer: This note was dictated with voice recognition software. Similar sounding words can inadvertently be transcribed and this note may contain transcription errors which may not have been corrected upon publication of note.**   I personally performed the services  described in this documentation, which was scribed in my presence. The recorded information has been reviewed and is accurate.

## 2014-04-05 ENCOUNTER — Telehealth: Payer: Self-pay | Admitting: *Deleted

## 2014-04-05 NOTE — Telephone Encounter (Signed)
Left message with male to call back  Dr. Cleta Alberts wants pt to come in fasting and to see Dr. Cleta Alberts on either Mon. 745-12, Wed. 745-1, or Thurs. 745-1 for a fasting glucose.

## 2014-04-07 NOTE — Telephone Encounter (Signed)
LMVM to CB. 

## 2014-04-07 NOTE — Telephone Encounter (Signed)
Called Dr. Jacinto Halim office and left message for Dr. Jacinto Halim per Dr. Cleta Alberts did call patient but has not seen him yet.

## 2014-04-08 NOTE — Telephone Encounter (Signed)
Spoke with pt. He can't come in tomorrow or Thurs but he will be here Sat morning fasting

## 2014-04-28 ENCOUNTER — Encounter: Payer: Self-pay | Admitting: Emergency Medicine

## 2014-05-06 ENCOUNTER — Encounter (HOSPITAL_COMMUNITY): Payer: Self-pay

## 2014-05-06 ENCOUNTER — Ambulatory Visit (HOSPITAL_COMMUNITY): Admit: 2014-05-06 | Payer: BC Managed Care – PPO | Admitting: Cardiology

## 2014-05-06 SURGERY — LEFT HEART CATHETERIZATION WITH CORONARY ANGIOGRAM
Anesthesia: LOCAL

## 2014-09-18 ENCOUNTER — Ambulatory Visit (INDEPENDENT_AMBULATORY_CARE_PROVIDER_SITE_OTHER): Payer: BC Managed Care – PPO

## 2014-09-18 ENCOUNTER — Ambulatory Visit (INDEPENDENT_AMBULATORY_CARE_PROVIDER_SITE_OTHER): Payer: BC Managed Care – PPO | Admitting: Family Medicine

## 2014-09-18 VITALS — BP 170/112 | HR 75 | Temp 98.3°F | Resp 18 | Ht 68.75 in | Wt 225.0 lb

## 2014-09-18 DIAGNOSIS — J209 Acute bronchitis, unspecified: Secondary | ICD-10-CM

## 2014-09-18 DIAGNOSIS — Z720 Tobacco use: Secondary | ICD-10-CM

## 2014-09-18 DIAGNOSIS — I10 Essential (primary) hypertension: Secondary | ICD-10-CM

## 2014-09-18 DIAGNOSIS — R05 Cough: Secondary | ICD-10-CM

## 2014-09-18 DIAGNOSIS — F17200 Nicotine dependence, unspecified, uncomplicated: Secondary | ICD-10-CM

## 2014-09-18 DIAGNOSIS — R0602 Shortness of breath: Secondary | ICD-10-CM

## 2014-09-18 DIAGNOSIS — R059 Cough, unspecified: Secondary | ICD-10-CM

## 2014-09-18 DIAGNOSIS — R300 Dysuria: Secondary | ICD-10-CM

## 2014-09-18 LAB — POCT URINALYSIS DIPSTICK
BILIRUBIN UA: NEGATIVE
GLUCOSE UA: NEGATIVE
Ketones, UA: NEGATIVE
Leukocytes, UA: NEGATIVE
NITRITE UA: NEGATIVE
Protein, UA: NEGATIVE
Spec Grav, UA: 1.02
Urobilinogen, UA: 0.2
pH, UA: 5

## 2014-09-18 LAB — POCT CBC
Granulocyte percent: 61.1 %G (ref 37–80)
HCT, POC: 48.3 % (ref 43.5–53.7)
Hemoglobin: 15.2 g/dL (ref 14.1–18.1)
LYMPH, POC: 2.2 (ref 0.6–3.4)
MCH: 25.8 pg — AB (ref 27–31.2)
MCHC: 31.5 g/dL — AB (ref 31.8–35.4)
MCV: 81.7 fL (ref 80–97)
MID (cbc): 0.2 (ref 0–0.9)
MPV: 8.7 fL (ref 0–99.8)
PLATELET COUNT, POC: 187 10*3/uL (ref 142–424)
POC Granulocyte: 3.7 (ref 2–6.9)
POC LYMPH PERCENT: 36.4 %L (ref 10–50)
POC MID %: 2.5 %M (ref 0–12)
RBC: 5.91 M/uL (ref 4.69–6.13)
RDW, POC: 17.5 %
WBC: 6 10*3/uL (ref 4.6–10.2)

## 2014-09-18 LAB — POCT UA - MICROSCOPIC ONLY
BACTERIA, U MICROSCOPIC: NEGATIVE
Casts, Ur, LPF, POC: NEGATIVE
Crystals, Ur, HPF, POC: NEGATIVE
Mucus, UA: NEGATIVE
RBC, urine, microscopic: NEGATIVE
WBC, UR, HPF, POC: NEGATIVE
Yeast, UA: NEGATIVE

## 2014-09-18 LAB — POCT INFLUENZA A/B
INFLUENZA A, POC: NEGATIVE
INFLUENZA B, POC: NEGATIVE

## 2014-09-18 MED ORDER — IPRATROPIUM-ALBUTEROL 20-100 MCG/ACT IN AERS: 1.0000 | INHALATION_SPRAY | Freq: Four times a day (QID) | RESPIRATORY_TRACT | Status: DC

## 2014-09-18 MED ORDER — IPRATROPIUM BROMIDE 0.02 % IN SOLN
0.5000 mg | Freq: Once | RESPIRATORY_TRACT | Status: AC
  Administered 2014-09-18: 0.5 mg via RESPIRATORY_TRACT

## 2014-09-18 MED ORDER — HYDROCOD POLST-CHLORPHEN POLST 10-8 MG/5ML PO LQCR: 5.0000 mL | Freq: Two times a day (BID) | ORAL | Status: DC | PRN

## 2014-09-18 MED ORDER — AZITHROMYCIN 500 MG PO TABS: 500.0000 mg | ORAL_TABLET | Freq: Every day | ORAL | Status: DC

## 2014-09-18 MED ORDER — GUAIFENESIN ER 1200 MG PO TB12: 1.0000 | ORAL_TABLET | Freq: Two times a day (BID) | ORAL | Status: DC | PRN

## 2014-09-18 MED ORDER — ALBUTEROL SULFATE (2.5 MG/3ML) 0.083% IN NEBU
2.5000 mg | INHALATION_SOLUTION | Freq: Once | RESPIRATORY_TRACT | Status: AC
  Administered 2014-09-18: 2.5 mg via RESPIRATORY_TRACT

## 2014-09-18 NOTE — Patient Instructions (Signed)

## 2014-09-18 NOTE — Progress Notes (Signed)
Subjective:    Patient ID: Thomas Yoder, male    DOB: 09-03-1961, 53 y.o.   MRN: [id number] Chief Complaint  Patient presents with  . sinus congestion    x3 days   . Chest Pain    tightness  . Chills  . Cough    HPI   Got ill 4d ago and has been out of work for the past 3d.  Had severe nasal congestion requiring mouth breathing with sinus pressure. He had severe rhinitis and developed pharyngitis. Then he developed chills and body aches. Has been using nyquil with partial relief - at least helps him get some sleep.  +HAs, no ear pain. Has now developed sneezing and cough for the 3d. Cough productive of clear thick mucous.  Has been ShoB with wheezing, chest tightness which is constant.  No n/v.  Increased frequency of urination and diarrhea.  No abd pain.  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  Is smoking 1 pack/wk.  Past Medical History  Diagnosis Date  . Hypertension    Current Outpatient Prescriptions on File Prior to Visit  Medication Sig Dispense Refill  . lisinopril-hydrochlorothiazide (PRINZIDE,ZESTORETIC) 10-12.5 MG per tablet Take 1 tablet by mouth daily.  90 tablet  3  . metoprolol succinate (TOPROL-XL) 50 MG 24 hr tablet Take 1 tablet (50 mg total) by mouth daily. Take with or immediately following a meal.  90 tablet  3   No current facility-administered medications on file prior to visit.   No Known Allergies  Review of Systems  Constitutional: Positive for chills, diaphoresis, activity change, appetite change and fatigue. Negative for fever and unexpected weight change.  HENT: Positive for congestion, postnasal drip, rhinorrhea, sinus pressure, sneezing and sore throat. Negative for ear pain and mouth sores.   Respiratory: Positive for cough, chest tightness and wheezing. Negative for shortness of breath and stridor.   Cardiovascular: Positive for chest pain.  Gastrointestinal: Positive for diarrhea. Negative for nausea, vomiting, abdominal pain  and constipation.  Genitourinary: Positive for frequency. Negative for dysuria.  Musculoskeletal: Positive for arthralgias and myalgias. Negative for neck pain and neck stiffness.  Skin: Negative for rash.  Neurological: Positive for headaches. Negative for syncope.  Hematological: Negative for adenopathy.  Psychiatric/Behavioral: Positive for sleep disturbance.       Objective:  BP 170/112  Pulse 75  Temp(Src) 98.3 F (36.8 C) (Oral)  Resp 18  Ht 5' 8.75" (1.746 m)  Wt 225 lb (102.059 kg)  BMI 33.48 kg/m2  SpO2 99%  Physical Exam  Constitutional: He is oriented to person, place, and time. He appears well-developed and well-nourished. No distress.  HENT:  Head: Normocephalic and atraumatic.    Right Ear: External ear and ear canal normal. Tympanic membrane is erythematous.  Left Ear: External ear and ear canal normal. Tympanic membrane is erythematous.  Nose: Mucosal edema and rhinorrhea present.  Mouth/Throat: Uvula is midline and mucous membranes are normal. No trismus in the jaw. No uvula swelling. Posterior oropharyngeal erythema present. No oropharyngeal exudate or posterior oropharyngeal edema.  Eyes: Conjunctivae are normal. Right eye exhibits no discharge. Left eye exhibits no discharge. No scleral icterus.  Neck: Normal range of motion. Neck supple. No thyromegaly present.  Cardiovascular: Normal rate, regular rhythm, normal heart sounds and intact distal pulses.   Pulmonary/Chest: Effort normal. No respiratory distress. He has decreased breath sounds. He has rhonchi in the right lower field and the left lower field.  Abdominal: Soft. Normal appearance and bowel  sounds are normal. There is no hepatosplenomegaly. There is generalized tenderness. There is no rigidity, no rebound, no guarding and no CVA tenderness.  Lymphadenopathy:       Head (right side): Submandibular and tonsillar adenopathy present.       Head (left side): Submandibular and tonsillar adenopathy  present.    He has no cervical adenopathy.       Right: No supraclavicular adenopathy present.       Left: No supraclavicular adenopathy present.  Neurological: He is alert and oriented to person, place, and time.  Skin: Skin is warm and dry. He is not diaphoretic. No erythema.  Psychiatric: He has a normal mood and affect. His behavior is normal.   [facility] reading (PRIMARY) by  Dr. Clelia Croft.  CXR:.Increased bibasilar infiltrates with more prominent LLL infiltrate retrocardiac  EXAM: CHEST 2 VIEW  COMPARISON: 02/12/2014  FINDINGS: Low lung volumes are present, causing crowding of the pulmonary vasculature. Thoracic spondylosis. Mildly enlarged cardiopericardial silhouette.  Airway thickening is present, suggesting bronchitis or reactive airways disease. No airspace opacity identified.  IMPRESSION: 1. Mildly enlarged cardiopericardial silhouette, without edema. 2. Airway thickening is present, suggesting bronchitis or reactive airways disease.   S/p duoneb pulmonary exam somewhat improved but still w/ decreased breath sounds throughout.  Peak flow: 400 predicted peak flow: 512; s/p duoneb peak flow 490    Results for orders placed in visit on 09/18/14  POCT CBC      Result Value Ref Range   WBC 6.0  4.6 - 10.2 K/uL   Lymph, poc 2.2  0.6 - 3.4   POC LYMPH PERCENT 36.4  10 - 50 %L   MID (cbc) 0.2  0 - 0.9   POC MID % 2.5  0 - 12 %M   POC Granulocyte 3.7  2 - 6.9   Granulocyte percent 61.1  37 - 80 %G   RBC 5.91  4.69 - 6.13 M/uL   Hemoglobin 15.2  14.1 - 18.1 g/dL   HCT, POC 16.1  09.6 - 53.7 %   MCV 81.7  80 - 97 fL   MCH, POC 25.8 (*) 27 - 31.2 pg   MCHC 31.5 (*) 31.8 - 35.4 g/dL   RDW, POC 04.5     Platelet Count, POC 187  142 - 424 K/uL   MPV 8.7  0 - 99.8 fL  POCT INFLUENZA A/B      Result Value Ref Range   Influenza A, POC Negative     Influenza B, POC Negative    POCT UA - MICROSCOPIC ONLY      Result Value Ref Range   WBC, Ur, HPF, POC Neg     RBC, urine,  microscopic Neg     Bacteria, U Microscopic Neg     Mucus, UA Neg     Epithelial cells, urine per micros 0-1     Crystals, Ur, HPF, POC Neg     Casts, Ur, LPF, POC Neg     Yeast, UA Neg    POCT URINALYSIS DIPSTICK      Result Value Ref Range   Color, UA Yellow     Clarity, UA Clear     Glucose, UA Neg     Bilirubin, UA Neg     Ketones, UA Neg     Spec Grav, UA 1.020     Blood, UA Trace-lysed     pH, UA 5.0     Protein, UA Neg     Urobilinogen, UA 0.2  Nitrite, UA Neg     Leukocytes, UA Negative      Assessment & Plan:  No decongestants due to BP. Recheck in 3d before he goes back to work  Essential hypertension, benign - Plan: DG Chest 2 View, POCT CBC, POCT Influenza A/B, albuterol (PROVENTIL) (2.5 MG/3ML) 0.083% nebulizer solution 2.5 mg, ipratropium (ATROVENT) nebulizer solution 0.5 mg  Tobacco use disorder - Plan: DG Chest 2 View, POCT CBC, POCT Influenza A/B, albuterol (PROVENTIL) (2.5 MG/3ML) 0.083% nebulizer solution 2.5 mg, ipratropium (ATROVENT) nebulizer solution 0.5 mg  Cough - Plan: DG Chest 2 View, POCT CBC, POCT Influenza A/B, albuterol (PROVENTIL) (2.5 MG/3ML) 0.083% nebulizer solution 2.5 mg, ipratropium (ATROVENT) nebulizer solution 0.5 mg  Shortness of breath - Plan: DG Chest 2 View, POCT CBC, POCT Influenza A/B, albuterol (PROVENTIL) (2.5 MG/3ML) 0.083% nebulizer solution 2.5 mg, ipratropium (ATROVENT) nebulizer solution 0.5 mg  Dysuria - Plan: POCT UA - Microscopic Only, POCT urinalysis dipstick  Acute bronchitis, unspecified organism  Meds ordered this encounter  Medications  . albuterol (PROVENTIL) (2.5 MG/3ML) 0.083% nebulizer solution 2.5 mg    Sig:   . ipratropium (ATROVENT) nebulizer solution 0.5 mg    Sig:   . azithromycin (ZITHROMAX) 500 MG tablet    Sig: Take 1 tablet (500 mg total) by mouth daily.    Dispense:  3 tablet    Refill:  0  . chlorpheniramine-HYDROcodone (TUSSIONEX PENNKINETIC ER) 10-8 MG/5ML LQCR    Sig: Take 5 mLs by mouth  every 12 (twelve) hours as needed.    Dispense:  120 mL    Refill:  0  . Ipratropium-Albuterol (COMBIVENT RESPIMAT) 20-100 MCG/ACT AERS respimat    Sig: Inhale 1 puff into the lungs every 6 (six) hours.    Dispense:  4 g    Refill:  1  . Guaifenesin (MUCINEX MAXIMUM STRENGTH) 1200 MG TB12    Sig: Take 1 tablet (1,200 mg total) by mouth every 12 (twelve) hours as needed.    Dispense:  14 tablet    Refill:  1     Norberto SorensonEva Dantrell Schertzer, MD MPH

## 2015-02-28 ENCOUNTER — Encounter (HOSPITAL_COMMUNITY): Payer: Self-pay | Admitting: Emergency Medicine

## 2015-02-28 ENCOUNTER — Emergency Department (HOSPITAL_COMMUNITY)
Admission: EM | Admit: 2015-02-28 | Discharge: 2015-02-28 | Disposition: A | Discharge status: Home / Self Care | Payer: BC Managed Care – PPO | Attending: Emergency Medicine | Admitting: Emergency Medicine

## 2015-02-28 DIAGNOSIS — I10 Essential (primary) hypertension: Secondary | ICD-10-CM | POA: Insufficient documentation

## 2015-02-28 DIAGNOSIS — Z79899 Other long term (current) drug therapy: Secondary | ICD-10-CM | POA: Diagnosis not present

## 2015-02-28 DIAGNOSIS — S81012A Laceration without foreign body, left knee, initial encounter: Secondary | ICD-10-CM | POA: Insufficient documentation

## 2015-02-28 DIAGNOSIS — Z792 Long term (current) use of antibiotics: Secondary | ICD-10-CM | POA: Insufficient documentation

## 2015-02-28 DIAGNOSIS — Y288XXA Contact with other sharp object, undetermined intent, initial encounter: Secondary | ICD-10-CM | POA: Insufficient documentation

## 2015-02-28 DIAGNOSIS — Y998 Other external cause status: Secondary | ICD-10-CM | POA: Diagnosis not present

## 2015-02-28 DIAGNOSIS — Y92193 Bedroom in other specified residential institution as the place of occurrence of the external cause: Secondary | ICD-10-CM | POA: Diagnosis not present

## 2015-02-28 DIAGNOSIS — Y9389 Activity, other specified: Secondary | ICD-10-CM | POA: Diagnosis not present

## 2015-02-28 DIAGNOSIS — S81812A Laceration without foreign body, left lower leg, initial encounter: Secondary | ICD-10-CM

## 2015-02-28 DIAGNOSIS — Z23 Encounter for immunization: Secondary | ICD-10-CM | POA: Insufficient documentation

## 2015-02-28 DIAGNOSIS — Z720 Tobacco use: Secondary | ICD-10-CM | POA: Insufficient documentation

## 2015-02-28 MED ORDER — LIDOCAINE-EPINEPHRINE (PF) 2 %-1:200000 IJ SOLN
10.0000 mL | Freq: Once | INTRAMUSCULAR | Status: AC
  Administered 2015-02-28: 10 mL
  Filled 2015-02-28: qty 20

## 2015-02-28 MED ORDER — TETANUS-DIPHTH-ACELL PERTUSSIS 5-2.5-18.5 LF-MCG/0.5 IM SUSP
0.5000 mL | Freq: Once | INTRAMUSCULAR | Status: AC
  Administered 2015-02-28: 0.5 mL via INTRAMUSCULAR
  Filled 2015-02-28: qty 0.5

## 2015-02-28 MED ORDER — BACITRACIN ZINC 500 UNIT/GM EX OINT: 1.0000 "application " | TOPICAL_OINTMENT | Freq: Two times a day (BID) | CUTANEOUS | Status: DC

## 2015-02-28 NOTE — ED Notes (Signed)
Patient states that he was turning over in his bed and a spring sliced his thigh just above the left knee.  Bleeding controlled.

## 2015-02-28 NOTE — ED Provider Notes (Signed)
CSN: [id number]     Arrival date & time 02/28/15  1950 History   First MD Initiated Contact with Patient 02/28/15 2007    This chart was scribed for non-physician practitioner, Antony Madura, PA, working with Jerelyn Scott, MD by Marica Otter, ED Scribe. This patient was seen in room TR07C/TR07C and the patient's care was started at 8:27 PM.  Chief Complaint  Patient presents with  . Extremity Laceration   HPI WUJ:[id number], STEVE A, MD HPI Comments: Thomas Yoder is a 54 y.o. male, with PMH noted below including daily tobacco use, who presents to the Emergency Department complaining of a 4cm laceration to left anterior upper leg onset today while pt was turning over his mattress. No change in wound since onset. No pain to the area, but is tender to touch. Patient denies modifying factors of symptoms. Pt cannot recall his last tetanus shot. No meds taken PTA.   Past Medical History  Diagnosis Date  . Hypertension    Past Surgical History  Procedure Laterality Date  . Knee surgery     History reviewed. No pertinent family history. History  Substance Use Topics  . Smoking status: Current Every Day Smoker -- 0.30 packs/day for 3 years    Types: Cigarettes  . Smokeless tobacco: Not on file  . Alcohol Use: No    Review of Systems  Constitutional: Negative for fever and chills.  Skin: Positive for wound (laceration to the anterior left upper, lower extremity ).  Psychiatric/Behavioral: Negative for confusion.  All other systems reviewed and are negative.   Allergies  Review of patient's allergies indicates no known allergies.  Home Medications   Prior to Admission medications   Medication Sig Start Date End Date Taking? Authorizing Provider  azithromycin (ZITHROMAX) 500 MG tablet Take 1 tablet (500 mg total) by mouth daily. 09/18/14   Sherren Mocha, MD  bacitracin ointment Apply 1 application topically 2 (two) times daily. 02/28/15   Antony Madura, PA-C  chlorpheniramine-HYDROcodone  (TUSSIONEX PENNKINETIC ER) 10-8 MG/5ML LQCR Take 5 mLs by mouth every 12 (twelve) hours as needed. 09/18/14   Sherren Mocha, MD  Guaifenesin [facility] MAXIMUM STRENGTH) 1200 MG TB12 Take 1 tablet (1,200 mg total) by mouth every 12 (twelve) hours as needed. 09/18/14   Sherren Mocha, MD  Ipratropium-Albuterol (COMBIVENT RESPIMAT) 20-100 MCG/ACT AERS respimat Inhale 1 puff into the lungs every 6 (six) hours. 09/18/14   Sherren Mocha, MD  lisinopril-hydrochlorothiazide (PRINZIDE,ZESTORETIC) 10-12.5 MG per tablet Take 1 tablet by mouth daily. 02/12/14 02/12/15  Collene Gobble, MD  metoprolol succinate (TOPROL-XL) 50 MG 24 hr tablet Take 1 tablet (50 mg total) by mouth daily. Take with or immediately following a meal. 02/12/14   Collene Gobble, MD   Triage Vitals: BP 180/113 mmHg  Pulse 83  Temp(Src) 98.4 F (36.9 C) (Oral)  Resp 16  Ht  (1.702 m)  Wt 220 lb (99.791 kg)  BMI 34.45 kg/m2  SpO2 99%  Physical Exam  Constitutional: He is oriented to person, place, and time. He appears well-developed and well-nourished. No distress.  Nontoxic/nonseptic appearing  HENT:  Head: Normocephalic and atraumatic.  Eyes: Conjunctivae and EOM are normal. No scleral icterus.  Neck: Normal range of motion.  Cardiovascular: Normal rate, regular rhythm and intact distal pulses.   DP and PT pulses 2+ in the LLE  Pulmonary/Chest: Effort normal. No respiratory distress.  Musculoskeletal: Normal range of motion.       Left knee: He exhibits laceration.  He exhibits normal range of motion, no swelling, no effusion, no deformity, no LCL laxity and no MCL laxity. Tenderness (at laceration site) found.       Legs: Neurological: He is alert and oriented to person, place, and time. He exhibits normal muscle tone. Coordination normal.  Sensation to light touch intact. Patient ambulatory in ED.  Skin: Skin is warm and dry. No rash noted. He is not diaphoretic. No erythema. No pallor.  Psychiatric: He has a normal mood and affect. His  behavior is normal.  Nursing note and vitals reviewed.   ED Course  Procedures (including critical care time) DIAGNOSTIC STUDIES: Oxygen Saturation is 99% on RA, nl by my interpretation.    COORDINATION OF CARE: 8:29 PM-Discussed treatment plan which includes laceration repair with pt at bedside and pt agreed to plan.   Labs Review Labs Reviewed - No data to display  Imaging Review No results found.   EKG Interpretation None      LACERATION REPAIR Performed by: Antony Madura Authorized by: Antony Madura Consent: Verbal consent obtained. Risks and benefits: risks, benefits and alternatives were discussed Consent given by: patient Patient identity confirmed: provided demographic data Prepped and Draped in normal sterile fashion Wound explored  Laceration Location: L knee  Laceration Length: 4cm  No Foreign Bodies seen or palpated  Anesthesia: local infiltration  Local anesthetic: lidocaine 2% with epinephrine  Anesthetic total: 5 ml  Irrigation method: syringe Amount of cleaning: standard  Skin closure: 4-0 ethilon  Number of sutures: 3  Technique: horizontal mattress  Patient tolerance: Patient tolerated the procedure well with no immediate complications.  MDM   Final diagnoses:  Laceration of left leg, initial encounter    Tdap booster given. Pressure irrigation performed. Laceration occurred < 8 hours prior to repair which was well tolerated. Patient has no comorbidities to effect normal wound healing. Discussed suture home care with patient and answered questions. Patient to follow up for wound check and suture removal in 10-12 days. Patient is hemodynamically stable with no complaints prior to discharge. Return precautions provided and patient agreeable to plan with no unaddressed concerns.  I personally performed the services described in this documentation, which was scribed in my presence. The recorded information has been reviewed and is  accurate.   Filed Vitals:   02/28/15 1958  BP: 180/113  Pulse: 83  Temp: 98.4 F (36.9 C)  TempSrc: Oral  Resp: 16  Height: 5\' 7"  (1.702 m)  Weight: 220 lb (99.791 kg)  SpO2: 99%      Antony Madura, PA-C 02/28/15 2106  Jerelyn Scott, MD 02/28/15 2112

## 2015-02-28 NOTE — Discharge Instructions (Signed)

## 2015-05-05 ENCOUNTER — Emergency Department (HOSPITAL_COMMUNITY)
Admission: EM | Admit: 2015-05-05 | Discharge: 2015-05-05 | Disposition: A | Discharge status: Home / Self Care | Payer: BC Managed Care – PPO | Attending: Emergency Medicine | Admitting: Emergency Medicine

## 2015-05-05 ENCOUNTER — Encounter (HOSPITAL_COMMUNITY): Payer: Self-pay | Admitting: Emergency Medicine

## 2015-05-05 ENCOUNTER — Emergency Department (HOSPITAL_COMMUNITY): Payer: BC Managed Care – PPO

## 2015-05-05 DIAGNOSIS — Z7951 Long term (current) use of inhaled steroids: Secondary | ICD-10-CM | POA: Insufficient documentation

## 2015-05-05 DIAGNOSIS — I10 Essential (primary) hypertension: Secondary | ICD-10-CM | POA: Insufficient documentation

## 2015-05-05 DIAGNOSIS — R079 Chest pain, unspecified: Secondary | ICD-10-CM | POA: Diagnosis present

## 2015-05-05 DIAGNOSIS — Z792 Long term (current) use of antibiotics: Secondary | ICD-10-CM | POA: Diagnosis not present

## 2015-05-05 DIAGNOSIS — J189 Pneumonia, unspecified organism: Secondary | ICD-10-CM

## 2015-05-05 DIAGNOSIS — Z79899 Other long term (current) drug therapy: Secondary | ICD-10-CM | POA: Insufficient documentation

## 2015-05-05 DIAGNOSIS — J159 Unspecified bacterial pneumonia: Secondary | ICD-10-CM | POA: Diagnosis not present

## 2015-05-05 DIAGNOSIS — Z720 Tobacco use: Secondary | ICD-10-CM | POA: Diagnosis not present

## 2015-05-05 LAB — BASIC METABOLIC PANEL
Anion gap: 12 (ref 5–15)
BUN: 11 mg/dL (ref 6–20)
CHLORIDE: 103 mmol/L (ref 101–111)
CO2: 21 mmol/L — ABNORMAL LOW (ref 22–32)
Calcium: 9.2 mg/dL (ref 8.9–10.3)
Creatinine, Ser: 1.05 mg/dL (ref 0.61–1.24)
GFR calc Af Amer: >60 mL/min (ref >60)
GFR calc non Af Amer: >60 mL/min (ref >60)
Glucose, Bld: 126 mg/dL — ABNORMAL HIGH (ref 65–99)
POTASSIUM: 4.1 mmol/L (ref 3.5–5.1)
Sodium: 136 mmol/L (ref 135–145)

## 2015-05-05 LAB — CBC
HCT: 42.2 % (ref 39.0–52.0)
Hemoglobin: 14.1 g/dL (ref 13.0–17.0)
MCH: 26.2 pg (ref 26.0–34.0)
MCHC: 33.4 g/dL (ref 30.0–36.0)
MCV: 78.3 fL (ref 78.0–100.0)
Platelets: 186 10*3/uL (ref 150–400)
RBC: 5.39 MIL/uL (ref 4.22–5.81)
RDW: 15.3 % (ref 11.5–15.5)
WBC: 10.4 10*3/uL (ref 4.0–10.5)

## 2015-05-05 LAB — DIFFERENTIAL
BASOS ABS: 0 10*3/uL (ref 0.0–0.1)
BASOS PCT: 0 % (ref 0–1)
Eosinophils Absolute: 0.1 10*3/uL (ref 0.0–0.7)
Eosinophils Relative: 1 % (ref 0–5)
Lymphocytes Relative: 15 % (ref 12–46)
Lymphs Abs: 1.5 10*3/uL (ref 0.7–4.0)
MONO ABS: 1.2 10*3/uL — AB (ref 0.1–1.0)
Monocytes Relative: 11 % (ref 3–12)
NEUTROS ABS: 7.5 10*3/uL (ref 1.7–7.7)
NEUTROS PCT: 73 % (ref 43–77)

## 2015-05-05 LAB — I-STAT TROPONIN, ED: Troponin i, poc: 0 ng/mL (ref 0.00–0.08)

## 2015-05-05 LAB — BRAIN NATRIURETIC PEPTIDE: B NATRIURETIC PEPTIDE 5: 102.1 pg/mL — AB (ref 0.0–100.0)

## 2015-05-05 MED ORDER — AZITHROMYCIN 250 MG PO TABS: 250.0000 mg | ORAL_TABLET | Freq: Every day | ORAL | Status: DC

## 2015-05-05 MED ORDER — ALBUTEROL SULFATE HFA 108 (90 BASE) MCG/ACT IN AERS
2.0000 | INHALATION_SPRAY | RESPIRATORY_TRACT | Status: DC | PRN
  Filled 2015-05-05: qty 6.7

## 2015-05-05 MED ORDER — DEXTROSE 5 % IV SOLN
1.0000 g | Freq: Once | INTRAVENOUS | Status: AC
  Administered 2015-05-05: 1 g via INTRAVENOUS
  Filled 2015-05-05: qty 10

## 2015-05-05 MED ORDER — DEXTROSE 5 % IV SOLN
500.0000 mg | Freq: Once | INTRAVENOUS | Status: AC
  Administered 2015-05-05: 500 mg via INTRAVENOUS
  Filled 2015-05-05: qty 500

## 2015-05-05 MED ORDER — AMOXICILLIN 500 MG PO CAPS: 1000.0000 mg | ORAL_CAPSULE | Freq: Two times a day (BID) | ORAL | Status: DC

## 2015-05-05 MED ORDER — IPRATROPIUM-ALBUTEROL 0.5-2.5 (3) MG/3ML IN SOLN
3.0000 mL | Freq: Once | RESPIRATORY_TRACT | Status: AC
  Administered 2015-05-05: 3 mL via RESPIRATORY_TRACT
  Filled 2015-05-05: qty 3

## 2015-05-05 NOTE — ED Provider Notes (Signed)
CSN: [id number]     Arrival date & time 05/05/15  8413 History   First MD Initiated Contact with Patient 05/05/15 0257     This chart was scribed for Dione Booze, MD by Arlan Organ, ED Scribe. This patient was seen in room A02C/A02C and the patient's care was started 3:04 AM.   Chief Complaint  Patient presents with  . Shortness of Breath  . Chest Pain   The history is provided by the patient. No language interpreter was used.    HPI Comments: Thomas Yoder is a 54 y.o. male with a PMHx of HTN who presents to the Emergency Department complaining of constant, ongoing, unchanged shortness of breath x 2 days. Pt also reports chest pain, chills, and a mild productive cough. Chest pain is described as throbbing and exacerbated with movement, palpation, and twisting. No alleviating factors at this time. No OTC medications or home remedies attempted prior to arrival for symptoms. No recent fever, nausea, vomiting, diarrhea, or diaphoresis. Pt was set up to have a heart catheterization approximately 1 year ago with Dr. Jacinto Halim after known ongoing chest pain. However, appointment was canceled and never rescheduled. Thomas Yoder he has followed with Dr. Jacinto Halim a few times in the past regarding chest pain but can recall cause/diagnosis from cardiology. He is an occasional smoker. Pt admits he does not check his blood pressure at home and is currently prescribed medication for this. He admits to a few missed dosages in last several days. No known allergies to medications.  Past Medical History  Diagnosis Date  . Hypertension    Past Surgical History  Procedure Laterality Date  . Knee surgery     No family history on file. History  Substance Use Topics  . Smoking status: Current Every Day Smoker -- 0.30 packs/day for 3 years    Types: Cigarettes  . Smokeless tobacco: Not on file  . Alcohol Use: No    Review of Systems  Constitutional: Positive for chills. Negative for fever and diaphoresis.   Respiratory: Positive for cough and shortness of breath.   Cardiovascular: Positive for chest pain.  Gastrointestinal: Negative for nausea, vomiting and diarrhea.  Skin: Negative for rash.  Psychiatric/Behavioral: Negative for confusion.      Allergies  Review of patient's allergies indicates no known allergies.  Home Medications   Prior to Admission medications   Medication Sig Start Date End Date Taking? Authorizing Provider  azithromycin (ZITHROMAX) 500 MG tablet Take 1 tablet (500 mg total) by mouth daily. 09/18/14   Sherren Mocha, MD  bacitracin ointment Apply 1 application topically 2 (two) times daily. 02/28/15   Antony Madura, PA-C  chlorpheniramine-HYDROcodone (TUSSIONEX PENNKINETIC ER) 10-8 MG/5ML LQCR Take 5 mLs by mouth every 12 (twelve) hours as needed. 09/18/14   Sherren Mocha, MD  Guaifenesin [facility] MAXIMUM STRENGTH) 1200 MG TB12 Take 1 tablet (1,200 mg total) by mouth every 12 (twelve) hours as needed. 09/18/14   Sherren Mocha, MD  Ipratropium-Albuterol (COMBIVENT RESPIMAT) 20-100 MCG/ACT AERS respimat Inhale 1 puff into the lungs every 6 (six) hours. 09/18/14   Sherren Mocha, MD  lisinopril-hydrochlorothiazide (PRINZIDE,ZESTORETIC) 10-12.5 MG per tablet Take 1 tablet by mouth daily. 02/12/14 02/12/15  Collene Gobble, MD  metoprolol succinate (TOPROL-XL) 50 MG 24 hr tablet Take 1 tablet (50 mg total) by mouth daily. Take with or immediately following a meal. 02/12/14   Collene Gobble, MD   Triage Vitals: BP 179/109 mmHg  Pulse 96  Temp(Src)  99.8 F (37.7 C) (Oral)  Resp 22  Ht  (1.727 m)  Wt 225 lb (102.059 kg)  BMI 34.22 kg/m2  SpO2 96%   Physical Exam  Constitutional: He is oriented to person, place, and time. He appears well-developed and well-nourished.  HENT:  Head: Normocephalic and atraumatic.  Eyes: EOM are normal. Pupils are equal, round, and reactive to light.  Neck: Normal range of motion. Neck supple. No JVD present.  Cardiovascular: Normal rate, regular rhythm,  normal heart sounds and intact distal pulses.   No murmur heard. Pulmonary/Chest: Effort normal. No respiratory distress. He has wheezes. He has no rales. He exhibits tenderness.  Moderate tenderness ower sternum Prolonged exhalation phase with slight wheezing in upper lung fields  Abdominal: Soft. Bowel sounds are normal. He exhibits no distension and no mass. There is no tenderness.  Musculoskeletal: Normal range of motion. He exhibits no edema.  Lymphadenopathy:    He has no cervical adenopathy.  Neurological: He is alert and oriented to person, place, and time. No cranial nerve deficit. He exhibits normal muscle tone. Coordination normal.  Skin: Skin is warm and dry. No rash noted.  Psychiatric: He has a normal mood and affect. His behavior is normal. Judgment and thought content normal.  Nursing note and vitals reviewed.   ED Course  Procedures (including critical care time)  DIAGNOSTIC STUDIES: Oxygen Saturation is 96% on RA, adequate by my interpretation.    COORDINATION OF CARE: 3:09 AM- Will give breathing treatment, Rocephin, and Zithromax. Will order CXR, CBC, BMP, BNP, i-stat troponin, and EKG. Discussed treatment plan with pt at bedside and pt agreed to plan.     Labs Review Results for orders placed or performed during the hospital encounter of 05/05/15  CBC  Result Value Ref Range   WBC 10.4 4.0 - 10.5 K/uL   RBC 5.39 4.22 - 5.81 MIL/uL   Hemoglobin 14.1 13.0 - 17.0 g/dL   HCT 16.1 09.6 - 04.5 %   MCV 78.3 78.0 - 100.0 fL   MCH 26.2 26.0 - 34.0 pg   MCHC 33.4 30.0 - 36.0 g/dL   RDW 40.9 81.1 - 91.4 %   Platelets 186 150 - 400 K/uL  Basic metabolic panel  Result Value Ref Range   Sodium 136 135 - 145 mmol/L   Potassium 4.1 3.5 - 5.1 mmol/L   Chloride 103 101 - 111 mmol/L   CO2 21 (L) 22 - 32 mmol/L   Glucose, Bld 126 (H) 65 - 99 mg/dL   BUN 11 6 - 20 mg/dL   Creatinine, Ser 7.82 0.61 - 1.24 mg/dL   Calcium 9.2 8.9 - 95.6 mg/dL   GFR calc non Af Amer >60  >60 mL/min   GFR calc Af Amer >60 >60 mL/min   Anion gap 12 5 - 15  BNP (order ONLY if patient complains of dyspnea/SOB AND you have documented it for THIS visit)  Result Value Ref Range   B Natriuretic Peptide 102.1 (H) 0.0 - 100.0 pg/mL  Differential  Result Value Ref Range   Neutrophils Relative % 73 43 - 77 %   Neutro Abs 7.5 1.7 - 7.7 K/uL   Lymphocytes Relative 15 12 - 46 %   Lymphs Abs 1.5 0.7 - 4.0 K/uL   Monocytes Relative 11 3 - 12 %   Monocytes Absolute 1.2 (H) 0.1 - 1.0 K/uL   Eosinophils Relative 1 0 - 5 %   Eosinophils Absolute 0.1 0.0 - 0.7 K/uL  Basophils Relative 0 0 - 1 %   Basophils Absolute 0.0 0.0 - 0.1 K/uL  I-stat troponin, ED  (not at [place], [facility])  Result Value Ref Range   Troponin i, poc 0.00 0.00 - 0.08 ng/mL   Comment 3           Imaging Review Dg Chest 2 View  05/05/2015   CLINICAL DATA:  Mid chest pain and shortness of breath for 1 day.  EXAM: CHEST  2 VIEW  COMPARISON:  09/18/2014  FINDINGS: There is patchy airspace opacity in the right middle lobe and lingula. The heart size is normal. Pulmonary vasculature is normal. There is no pleural effusion or pneumothorax. Degenerative change in the spine without acute osseous abnormality.  IMPRESSION: Patchy airspace opacity in the right middle lobe and lingula, concerning for pneumonia. Followup PA and lateral chest X-ray is recommended in 3-4 weeks following trial of antibiotic therapy to ensure resolution and exclude underlying malignancy.   Electronically Signed   By: Rubye Oaks M.D.   On: 05/05/2015 03:06   Images viewed by me.   EKG Interpretation   Date/Time:  Tuesday May 05 2015 02:42:40 EDT Ventricular Rate:  100 PR Interval:  144 QRS Duration: 78 QT Interval:  334 QTC Calculation: 430 R Axis:   -2 Text Interpretation:  Normal sinus rhythm Minimal voltage criteria for  LVH, may be normal variant Possible Anterior infarct , age undetermined  Abnormal ECG When compared with ECG of 10/03/2013,  Nonspecific T wave  abnormality is no longer Present Confirmed by [facility]  MD, Franci Oshana ([id number]) on  05/05/2015 2:58:42 AM      MDM   Final diagnoses:  Community acquired pneumonia  Essential hypertension    Cough with wheezing suggestive of either bronchitis or pneumonia. Chest x-ray appears to show right middle lobe and lingular infiltrates. He is given initial dose of ceftriaxone and azithromycin in the ED. He is given a therapeutic trial of albuterol with ipratropium via nebulizer. Laboratory workup is reassuring and anticipate treating him as an outpatient.  Laboratory workup is reassuring. Bilobar pneumonia is worrisome that it may be an atypical infection. He did get some subjective relief with nebulizer treatment with albuterol and ipratropium. He is discharged with prescriptions for amoxicillin, azithromycin, and albuterol inhaler. I reviewed his past records and he has consistently elevated blood pressure-many readings very elevated. He is on relatively low doses of antihypertensive medication and I suspect he may need to be on higher doses. I have instructed him to check his blood pressure at home on a regular basis and to bring a record of the readings when he sees his PCP.  I personally performed the services described in this documentation, which was scribed in my presence. The recorded information has been reviewed and is accurate.      Dione Booze, MD 05/05/15 [phone]

## 2015-05-05 NOTE — Discharge Instructions (Signed)
Please monitor your blood pressure at home. Keep a record of it and take it with you when you see your primary care provider.  Pneumonia Pneumonia is an infection of the lungs.  CAUSES Pneumonia may be caused by bacteria or a virus. Usually, these infections are caused by breathing infectious particles into the lungs (respiratory tract). SIGNS AND SYMPTOMS   Cough.  Fever.  Chest pain.  Increased rate of breathing.  Wheezing.  Mucus production. DIAGNOSIS  If you have the common symptoms of pneumonia, your health care provider will typically confirm the diagnosis with a chest X-ray. The X-ray will show an abnormality in the lung (pulmonary infiltrate) if you have pneumonia. Other tests of your blood, urine, or sputum may be done to find the specific cause of your pneumonia. Your health care provider may also do tests (blood gases or pulse oximetry) to see how well your lungs are working. TREATMENT  Some forms of pneumonia may be spread to other people when you cough or sneeze. You may be asked to wear a mask before and during your exam. Pneumonia that is caused by bacteria is treated with antibiotic medicine. Pneumonia that is caused by the influenza virus may be treated with an antiviral medicine. Most other viral infections must run their course. These infections will not respond to antibiotics.  HOME CARE INSTRUCTIONS   Cough suppressants may be used if you are losing too much rest. However, coughing protects you by clearing your lungs. You should avoid using cough suppressants if you can.  Your health care provider may have prescribed medicine if he or she thinks your pneumonia is caused by bacteria or influenza. Finish your medicine even if you start to feel better.  Your health care provider may also prescribe an expectorant. This loosens the mucus to be coughed up.  Take medicines only as directed by your health care provider.  Do not smoke. Smoking is a common cause of  bronchitis and can contribute to pneumonia. If you are a smoker and continue to smoke, your cough may last several weeks after your pneumonia has cleared.  A cold steam vaporizer or humidifier in your room or home may help loosen mucus.  Coughing is often worse at night. Sleeping in a semi-upright position in a recliner or using a couple pillows under your head will help with this.  Get rest as you feel it is needed. Your body will usually let you know when you need to rest. PREVENTION A pneumococcal shot (vaccine) is available to prevent a common bacterial cause of pneumonia. This is usually suggested for:  People over 10 years old.  Patients on chemotherapy.  People with chronic lung problems, such as bronchitis or emphysema.  People with immune system problems. If you are over 65 or have a high risk condition, you may receive the pneumococcal vaccine if you have not received it before. In some countries, a routine influenza vaccine is also recommended. This vaccine can help prevent some cases of pneumonia.You may be offered the influenza vaccine as part of your care. If you smoke, it is time to quit. You may receive instructions on how to stop smoking. Your health care provider can provide medicines and counseling to help you quit. SEEK MEDICAL CARE IF: You have a fever. SEEK IMMEDIATE MEDICAL CARE IF:   Your illness becomes worse. This is especially true if you are elderly or weakened from any other disease.  You cannot control your cough with suppressants and are losing  sleep.  You begin coughing up blood.  You develop pain which is getting worse or is uncontrolled with medicines.  Any of the symptoms which initially brought you in for treatment are getting worse rather than better.  You develop shortness of breath or chest pain. MAKE SURE YOU:   Understand these instructions.  Will watch your condition.  Will get help right away if you are not doing well or get  worse. Document Released: 12/05/2005 Document Revised: 04/21/2014 Document Reviewed: 02/24/2011 [facility] Patient Information 2015 [place], [place]. This information is not intended to replace advice given to you by your health care provider. Make sure you discuss any questions you have with your health care provider.  Hypertension Hypertension, commonly called high blood pressure, is when the force of blood pumping through your arteries is too strong. Your arteries are the blood vessels that carry blood from your heart throughout your body. A blood pressure reading consists of a higher number over a lower number, such as 110/72. The higher number (systolic) is the pressure inside your arteries when your heart pumps. The lower number (diastolic) is the pressure inside your arteries when your heart relaxes. Ideally you want your blood pressure below 120/80. Hypertension forces your heart to work harder to pump blood. Your arteries may become narrow or stiff. Having hypertension puts you at risk for heart disease, stroke, and other problems.  RISK FACTORS Some risk factors for high blood pressure are controllable. Others are not.  Risk factors you cannot control include:   Race. You may be at higher risk if you are African American.  Age. Risk increases with age.  Gender. Men are at higher risk than women before age 65 years. After age 59, women are at higher risk than men. Risk factors you can control include:  Not getting enough exercise or physical activity.  Being overweight.  Getting too much fat, sugar, calories, or salt in your diet.  Drinking too much alcohol. SIGNS AND SYMPTOMS Hypertension does not usually cause signs or symptoms. Extremely high blood pressure (hypertensive crisis) may cause headache, anxiety, shortness of breath, and nosebleed. DIAGNOSIS  To check if you have hypertension, your health care provider will measure your blood pressure while you are seated, with your  arm held at the level of your heart. It should be measured at least twice using the same arm. Certain conditions can cause a difference in blood pressure between your right and left arms. A blood pressure reading that is higher than normal on one occasion does not mean that you need treatment. If one blood pressure reading is high, ask your health care provider about having it checked again. TREATMENT  Treating high blood pressure includes making lifestyle changes and possibly taking medicine. Living a healthy lifestyle can help lower high blood pressure. You may need to change some of your habits. Lifestyle changes may include:  Following the DASH diet. This diet is high in fruits, vegetables, and whole grains. It is low in salt, red meat, and added sugars.  Getting at least 2 hours of brisk physical activity every week.  Losing weight if necessary.  Not smoking.  Limiting alcoholic beverages.  Learning ways to reduce stress. If lifestyle changes are not enough to get your blood pressure under control, your health care provider may prescribe medicine. You may need to take more than one. Work closely with your health care provider to understand the risks and benefits. HOME CARE INSTRUCTIONS  Have your blood pressure rechecked as  directed by your health care provider.   Take medicines only as directed by your health care provider. Follow the directions carefully. Blood pressure medicines must be taken as prescribed. The medicine does not work as well when you skip doses. Skipping doses also puts you at risk for problems.   Do not smoke.   Monitor your blood pressure at home as directed by your health care provider. SEEK MEDICAL CARE IF:   You think you are having a reaction to medicines taken.  You have recurrent headaches or feel dizzy.  You have swelling in your ankles.  You have trouble with your vision. SEEK IMMEDIATE MEDICAL CARE IF:  You develop a severe headache or  confusion.  You have unusual weakness, numbness, or feel faint.  You have severe chest or abdominal pain.  You vomit repeatedly.  You have trouble breathing. MAKE SURE YOU:   Understand these instructions.  Will watch your condition.  Will get help right away if you are not doing well or get worse. Document Released: 12/05/2005 Document Revised: 04/21/2014 Document Reviewed: 09/27/2013 [facility] Patient Information 2015 [place], [place]. This information is not intended to replace advice given to you by your health care provider. Make sure you discuss any questions you have with your health care provider.  Albuterol inhalation aerosol What is this medicine? ALBUTEROL (al Gaspar Bidding) is a bronchodilator. It helps open up the airways in your lungs to make it easier to breathe. This medicine is used to treat and to prevent bronchospasm. This medicine may be used for other purposes; ask your health care provider or pharmacist if you have questions. COMMON BRAND NAME(S): Proair HFA, Proventil, Proventil HFA, Respirol, Ventolin, Ventolin HFA What should I tell my health care provider before I take this medicine? They need to know if you have any of the following conditions: -diabetes -heart disease or irregular heartbeat -high blood pressure -pheochromocytoma -seizures -thyroid disease -an unusual or allergic reaction to albuterol, levalbuterol, sulfites, other medicines, foods, dyes, or preservatives -pregnant or trying to get pregnant -breast-feeding How should I use this medicine? This medicine is for inhalation through the mouth. Follow the directions on your prescription label. Take your medicine at regular intervals. Do not use more often than directed. Make sure that you are using your inhaler correctly. Ask you doctor or health care provider if you have any questions. Talk to your pediatrician regarding the use of this medicine in children. Special care may be  needed. Overdosage: If you think you have taken too much of this medicine contact a poison control center or emergency room at once. NOTE: This medicine is only for you. Do not share this medicine with others. What if I miss a dose? If you miss a dose, use it as soon as you can. If it is almost time for your next dose, use only that dose. Do not use double or extra doses. What may interact with this medicine? -anti-infectives like chloroquine and pentamidine -caffeine -cisapride -diuretics -medicines for colds -medicines for depression or for emotional or psychotic conditions -medicines for weight loss including some herbal products -methadone -some antibiotics like clarithromycin, erythromycin, levofloxacin, and linezolid -some heart medicines -steroid hormones like dexamethasone, cortisone, hydrocortisone -theophylline -thyroid hormones This list may not describe all possible interactions. Give your health care provider a list of all the medicines, herbs, non-prescription drugs, or dietary supplements you use. Also tell them if you smoke, drink alcohol, or use illegal drugs. Some items may interact with your medicine.  What should I watch for while using this medicine? Tell your doctor or health care professional if your symptoms do not improve. Do not use extra albuterol. If your asthma or bronchitis gets worse while you are using this medicine, call your doctor right away. If your mouth gets dry try chewing sugarless gum or sucking hard candy. Drink water as directed. What side effects may I notice from receiving this medicine? Side effects that you should report to your doctor or health care professional as soon as possible: -allergic reactions like skin rash, itching or hives, swelling of the face, lips, or tongue -breathing problems -chest pain -feeling faint or lightheaded, falls -high blood pressure -irregular heartbeat -fever -muscle cramps or weakness -pain, tingling,  numbness in the hands or feet -vomiting Side effects that usually do not require medical attention (report to your doctor or health care professional if they continue or are bothersome): -cough -difficulty sleeping -headache -nervousness or trembling -stomach upset -stuffy or runny nose -throat irritation -unusual taste This list may not describe all possible side effects. Call your doctor for medical advice about side effects. You may report side effects to FDA at 1-800-FDA-1088. Where should I keep my medicine? Keep out of the reach of children. Store at room temperature between 15 and 30 degrees C (59 and 86 degrees F). The contents are under pressure and may burst when exposed to heat or flame. Do not freeze. This medicine does not work as well if it is too cold. Throw away any unused medicine after the expiration date. Inhalers need to be thrown away after the labeled number of puffs have been used or by the expiration date; whichever comes first. Ventolin HFA should be thrown away 12 months after removing from foil pouch. Check the instructions that come with your medicine. NOTE: This sheet is a summary. It may not cover all possible information. If you have questions about this medicine, talk to your doctor, pharmacist, or health care provider.  2015, [employer]. (2013-05-23 10:57:17)  Amoxicillin capsules or tablets What is this medicine? AMOXICILLIN (a mox i SIL in) is a penicillin antibiotic. It is used to treat certain kinds of bacterial infections. It will not work for colds, flu, or other viral infections. This medicine may be used for other purposes; ask your health care provider or pharmacist if you have questions. COMMON BRAND NAME(S): Amoxil, Moxilin, Sumox, Trimox What should I tell my health care provider before I take this medicine? They need to know if you have any of these conditions: -asthma -kidney disease -an unusual or allergic reaction to  amoxicillin, other penicillins, cephalosporin antibiotics, other medicines, foods, dyes, or preservatives -pregnant or trying to get pregnant -breast-feeding How should I use this medicine? Take this medicine by mouth with a glass of water. Follow the directions on your prescription label. You may take this medicine with food or on an empty stomach. Take your medicine at regular intervals. Do not take your medicine more often than directed. Take all of your medicine as directed even if you think your are better. Do not skip doses or stop your medicine early. Talk to your pediatrician regarding the use of this medicine in children. While this drug may be prescribed for selected conditions, precautions do apply. Overdosage: If you think you have taken too much of this medicine contact a poison control center or emergency room at once. NOTE: This medicine is only for you. Do not share this medicine with others. What if I miss  a dose? If you miss a dose, take it as soon as you can. If it is almost time for your next dose, take only that dose. Do not take double or extra doses. What may interact with this medicine? -amiloride -birth control pills -chloramphenicol -macrolides -probenecid -sulfonamides -tetracyclines This list may not describe all possible interactions. Give your health care provider a list of all the medicines, herbs, non-prescription drugs, or dietary supplements you use. Also tell them if you smoke, drink alcohol, or use illegal drugs. Some items may interact with your medicine. What should I watch for while using this medicine? Tell your doctor or health care professional if your symptoms do not improve in 2 or 3 days. Take all of the doses of your medicine as directed. Do not skip doses or stop your medicine early. If you are diabetic, you may get a false positive result for sugar in your urine with certain brands of urine tests. Check with your doctor. Do not treat diarrhea with  over-the-counter products. Contact your doctor if you have diarrhea that lasts more than 2 days or if the diarrhea is severe and watery. What side effects may I notice from receiving this medicine? Side effects that you should report to your doctor or health care professional as soon as possible: -allergic reactions like skin rash, itching or hives, swelling of the face, lips, or tongue -breathing problems -dark urine -redness, blistering, peeling or loosening of the skin, including inside the mouth -seizures -severe or watery diarrhea -trouble passing urine or change in the amount of urine -unusual bleeding or bruising -unusually weak or tired -yellowing of the eyes or skin Side effects that usually do not require medical attention (report to your doctor or health care professional if they continue or are bothersome): -dizziness -headache -stomach upset -trouble sleeping This list may not describe all possible side effects. Call your doctor for medical advice about side effects. You may report side effects to FDA at 1-800-FDA-1088. Where should I keep my medicine? Keep out of the reach of children. Store between 68 and 77 degrees F (20 and 25 degrees C). Keep bottle closed tightly. Throw away any unused medicine after the expiration date. NOTE: This sheet is a summary. It may not cover all possible information. If you have questions about this medicine, talk to your doctor, pharmacist, or health care provider.  2015, [employer] Standard. (2008-02-26 14:10:59)  Azithromycin tablets What is this medicine? AZITHROMYCIN (az ith roe MYE sin) is a macrolide antibiotic. It is used to treat or prevent certain kinds of bacterial infections. It will not work for colds, flu, or other viral infections. This medicine may be used for other purposes; ask your health care provider or pharmacist if you have questions. COMMON BRAND NAME(S): Zithromax, Zithromax Tri-Pak, Zithromax Z-Pak What should I  tell my health care provider before I take this medicine? They need to know if you have any of these conditions: -kidney disease -liver disease -irregular heartbeat or heart disease -an unusual or allergic reaction to azithromycin, erythromycin, other macrolide antibiotics, foods, dyes, or preservatives -pregnant or trying to get pregnant -breast-feeding How should I use this medicine? Take this medicine by mouth with a full glass of water. Follow the directions on the prescription label. The tablets can be taken with food or on an empty stomach. If the medicine upsets your stomach, take it with food. Take your medicine at regular intervals. Do not take your medicine more often than directed. Take all of your  medicine as directed even if you think your are better. Do not skip doses or stop your medicine early. Talk to your pediatrician regarding the use of this medicine in children. Special care may be needed. Overdosage: If you think you have taken too much of this medicine contact a poison control center or emergency room at once. NOTE: This medicine is only for you. Do not share this medicine with others. What if I miss a dose? If you miss a dose, take it as soon as you can. If it is almost time for your next dose, take only that dose. Do not take double or extra doses. What may interact with this medicine? Do not take this medicine with any of the following medications: -lincomycin This medicine may also interact with the following medications: -amiodarone -antacids -birth control pills -cyclosporine -digoxin -magnesium -nelfinavir -phenytoin -warfarin This list may not describe all possible interactions. Give your health care provider a list of all the medicines, herbs, non-prescription drugs, or dietary supplements you use. Also tell them if you smoke, drink alcohol, or use illegal drugs. Some items may interact with your medicine. What should I watch for while using this  medicine? Tell your doctor or health care professional if your symptoms do not improve. Do not treat diarrhea with over the counter products. Contact your doctor if you have diarrhea that lasts more than 2 days or if it is severe and watery. This medicine can make you more sensitive to the sun. Keep out of the sun. If you cannot avoid being in the sun, wear protective clothing and use sunscreen. Do not use sun lamps or tanning beds/booths. What side effects may I notice from receiving this medicine? Side effects that you should report to your doctor or health care professional as soon as possible: -allergic reactions like skin rash, itching or hives, swelling of the face, lips, or tongue -confusion, nightmares or hallucinations -dark urine -difficulty breathing -hearing loss -irregular heartbeat or chest pain -pain or difficulty passing urine -redness, blistering, peeling or loosening of the skin, including inside the mouth -white patches or sores in the mouth -yellowing of the eyes or skin Side effects that usually do not require medical attention (report to your doctor or health care professional if they continue or are bothersome): -diarrhea -dizziness, drowsiness -headache -stomach upset or vomiting -tooth discoloration -vaginal irritation This list may not describe all possible side effects. Call your doctor for medical advice about side effects. You may report side effects to FDA at 1-800-FDA-1088. Where should I keep my medicine? Keep out of the reach of children. Store at room temperature between 15 and 30 degrees C (59 and 86 degrees F). Throw away any unused medicine after the expiration date. NOTE: This sheet is a summary. It may not cover all possible information. If you have questions about this medicine, talk to your doctor, pharmacist, or health care provider.  2015, [employer]. (2013-07-11 15:38:48)

## 2015-05-05 NOTE — ED Notes (Signed)
PT reports SOB and chest pain ongoing for two days. Pt reports a productive cough as well. A&O X4.

## 2015-09-22 ENCOUNTER — Encounter: Payer: Self-pay | Admitting: Emergency Medicine

## 2015-11-03 ENCOUNTER — Encounter (HOSPITAL_COMMUNITY): Payer: Self-pay | Admitting: Emergency Medicine

## 2015-11-03 ENCOUNTER — Emergency Department (HOSPITAL_COMMUNITY)
Admission: EM | Admit: 2015-11-03 | Discharge: 2015-11-03 | Disposition: A | Discharge status: Home / Self Care | Payer: BC Managed Care – PPO | Attending: Emergency Medicine | Admitting: Emergency Medicine

## 2015-11-03 DIAGNOSIS — F419 Anxiety disorder, unspecified: Secondary | ICD-10-CM | POA: Insufficient documentation

## 2015-11-03 DIAGNOSIS — I10 Essential (primary) hypertension: Secondary | ICD-10-CM

## 2015-11-03 DIAGNOSIS — Z760 Encounter for issue of repeat prescription: Secondary | ICD-10-CM | POA: Diagnosis present

## 2015-11-03 DIAGNOSIS — Z8659 Personal history of other mental and behavioral disorders: Secondary | ICD-10-CM

## 2015-11-03 DIAGNOSIS — F17210 Nicotine dependence, cigarettes, uncomplicated: Secondary | ICD-10-CM | POA: Insufficient documentation

## 2015-11-03 MED ORDER — LISINOPRIL 10 MG PO TABS: 10.0000 mg | ORAL_TABLET | Freq: Every day | ORAL | Status: DC

## 2015-11-03 MED ORDER — HYDROCHLOROTHIAZIDE 12.5 MG PO TABS: 12.5000 mg | ORAL_TABLET | Freq: Every day | ORAL | Status: DC

## 2015-11-03 NOTE — ED Provider Notes (Signed)
CSN: [id number]     Arrival date & time 11/03/15  1027 History   First MD Initiated Contact with Patient 11/03/15 1116     Chief Complaint  Patient presents with  . Medication Refill  . Hypertension  . Anxiety     (Consider location/radiation/quality/duration/timing/severity/associated sxs/prior Treatment) Patient is a 54 y.o. male presenting with anxiety. The history is provided by the patient.  Anxiety This is a recurrent problem. Episode onset: last 2 months. The problem occurs constantly. The problem has not changed since onset.Pertinent negatives include no chest pain, no abdominal pain and no headaches. Nothing aggravates the symptoms. Nothing relieves the symptoms. He has tried nothing for the symptoms.    Past Medical History  Diagnosis Date  . Hypertension    Past Surgical History  Procedure Laterality Date  . Knee surgery     History reviewed. No pertinent family history. Social History  Substance Use Topics  . Smoking status: Current Every Day Smoker -- 0.30 packs/day for 3 years    Types: Cigarettes  . Smokeless tobacco: None  . Alcohol Use: No    Review of Systems  Cardiovascular: Negative for chest pain.  Gastrointestinal: Negative for abdominal pain.  Neurological: Negative for headaches.  All other systems reviewed and are negative.     Allergies  Review of patient's allergies indicates no known allergies.  Home Medications   Prior to Admission medications   Medication Sig Start Date End Date Taking? Authorizing Provider  ibuprofen (ADVIL,MOTRIN) 200 MG tablet Take 800 mg by mouth every 6 (six) hours as needed (pain).   Yes Historical Provider, MD  Ipratropium-Albuterol (COMBIVENT RESPIMAT) 20-100 MCG/ACT AERS respimat Inhale 1 puff into the lungs every 6 (six) hours. Patient not taking: Reported on 05/05/2015 09/18/14   Sherren Mocha, MD  lisinopril-hydrochlorothiazide (PRINZIDE,ZESTORETIC) 10-12.5 MG per tablet Take 1 tablet by mouth daily. 02/12/14  02/12/15  Collene Gobble, MD  metoprolol succinate (TOPROL-XL) 50 MG 24 hr tablet Take 1 tablet (50 mg total) by mouth daily. Take with or immediately following a meal. Patient not taking: Reported on 11/03/2015 02/12/14   Collene Gobble, MD   BP 170/123 mmHg  Pulse 65  Temp(Src) 97.5 F (36.4 C) (Oral)  Resp 18  SpO2 100% Physical Exam  Constitutional: He is oriented to person, place, and time. He appears well-developed and well-nourished. No distress.  HENT:  Head: Normocephalic and atraumatic.  Eyes: Conjunctivae are normal.  Neck: Neck supple. No tracheal deviation present.  Cardiovascular: Normal rate, regular rhythm and normal heart sounds.   Pulmonary/Chest: Effort normal and breath sounds normal. No respiratory distress.  Abdominal: Soft. He exhibits no distension. There is no tenderness.  Neurological: He is alert and oriented to person, place, and time.  Skin: Skin is warm and dry.  Psychiatric: He has a normal mood and affect.  Vitals reviewed.   ED Course  Procedures (including critical care time) Labs Review Labs Reviewed - No data to display  Imaging Review No results found. I have personally reviewed and evaluated these images and lab results as part of my medical decision-making.   EKG Interpretation None      MDM   Final diagnoses:  Chronic hypertension  History of episode of anxiety    54 year old male with history of hypertension presents with multiple anxiety episodes over the last few weeks. He ran out of blood pressure medications a month ago and has not taken any since it was too expensive for him to refill.  He is hypertensive here but is currently asymptomatic despite some mild anxiety. He was given a prescription for lisinopril and hydrochlorothiazide and non-combination form so that he will be more likely to afford it. I recommended that he follow-up with a primary care physician for further workup and management of asymptomatic hypertension. Return  precautions were discussed for worsening or new concerning symptoms.  Lyndal Pulley, MD 11/04/15 [phone]

## 2015-11-03 NOTE — Discharge Instructions (Signed)
Hypertension Hypertension, commonly called high blood pressure, is when the force of blood pumping through your arteries is too strong. Your arteries are the blood vessels that carry blood from your heart throughout your body. A blood pressure reading consists of a higher number over a lower number, such as 110/72. The higher number (systolic) is the pressure inside your arteries when your heart pumps. The lower number (diastolic) is the pressure inside your arteries when your heart relaxes. Ideally you want your blood pressure below 120/80. Hypertension forces your heart to work harder to pump blood. Your arteries may become narrow or stiff. Having untreated or uncontrolled hypertension can cause heart attack, stroke, kidney disease, and other problems. RISK FACTORS Some risk factors for high blood pressure are controllable. Others are not.  Risk factors you cannot control include:   Race. You may be at higher risk if you are African American.  Age. Risk increases with age.  Gender. Men are at higher risk than women before age 45 years. After age 65, women are at higher risk than men. Risk factors you can control include:  Not getting enough exercise or physical activity.  Being overweight.  Getting too much fat, sugar, calories, or salt in your diet.  Drinking too much alcohol. SIGNS AND SYMPTOMS Hypertension does not usually cause signs or symptoms. Extremely high blood pressure (hypertensive crisis) may cause headache, anxiety, shortness of breath, and nosebleed. DIAGNOSIS To check if you have hypertension, your health care provider will measure your blood pressure while you are seated, with your arm held at the level of your heart. It should be measured at least twice using the same arm. Certain conditions can cause a difference in blood pressure between your right and left arms. A blood pressure reading that is higher than normal on one occasion does not mean that you need treatment. If  it is not clear whether you have high blood pressure, you may be asked to return on a different day to have your blood pressure checked again. Or, you may be asked to monitor your blood pressure at home for 1 or more weeks. TREATMENT Treating high blood pressure includes making lifestyle changes and possibly taking medicine. Living a healthy lifestyle can help lower high blood pressure. You may need to change some of your habits. Lifestyle changes may include:  Following the DASH diet. This diet is high in fruits, vegetables, and whole grains. It is low in salt, red meat, and added sugars.  Keep your sodium intake below 2,300 mg per day.  Getting at least 30-45 minutes of aerobic exercise at least 4 times per week.  Losing weight if necessary.  Not smoking.  Limiting alcoholic beverages.  Learning ways to reduce stress. Your health care provider may prescribe medicine if lifestyle changes are not enough to get your blood pressure under control, and if one of the following is true:  You are 18-59 years of age and your systolic blood pressure is above 140.  You are 60 years of age or older, and your systolic blood pressure is above 150.  Your diastolic blood pressure is above 90.  You have diabetes, and your systolic blood pressure is over 140 or your diastolic blood pressure is over 90.  You have kidney disease and your blood pressure is above 140/90.  You have heart disease and your blood pressure is above 140/90. Your personal target blood pressure may vary depending on your medical conditions, your age, and other factors. HOME CARE INSTRUCTIONS    Have your blood pressure rechecked as directed by your health care provider.   Take medicines only as directed by your health care provider. Follow the directions carefully. Blood pressure medicines must be taken as prescribed. The medicine does not work as well when you skip doses. Skipping doses also puts you at risk for  problems.  Do not smoke.   Monitor your blood pressure at home as directed by your health care provider. SEEK MEDICAL CARE IF:   You think you are having a reaction to medicines taken.  You have recurrent headaches or feel dizzy.  You have swelling in your ankles.  You have trouble with your vision. SEEK IMMEDIATE MEDICAL CARE IF:  You develop a severe headache or confusion.  You have unusual weakness, numbness, or feel faint.  You have severe chest or abdominal pain.  You vomit repeatedly.  You have trouble breathing. MAKE SURE YOU:   Understand these instructions.  Will watch your condition.  Will get help right away if you are not doing well or get worse.   This information is not intended to replace advice given to you by your health care provider. Make sure you discuss any questions you have with your health care provider.   Document Released: 12/05/2005 Document Revised: 04/21/2015 Document Reviewed: 09/27/2013 Elsevier Interactive Patient Education 2016 [employer]  

## 2015-11-03 NOTE — ED Notes (Signed)
Pt reports being out of medications x 1 month, including BP, and having difficulty with paying for the prescriptions.  Pt states the last several times he's had medications given to him or refilled it was at one of the [facility].  Denies pain, denies any other complaint at this time.

## 2015-11-03 NOTE — ED Notes (Signed)
Pt sts needs htn med refill and having frequent panic attacks

## 2016-01-06 ENCOUNTER — Ambulatory Visit (INDEPENDENT_AMBULATORY_CARE_PROVIDER_SITE_OTHER): Payer: BC Managed Care – PPO | Admitting: Urgent Care

## 2016-01-06 ENCOUNTER — Ambulatory Visit (INDEPENDENT_AMBULATORY_CARE_PROVIDER_SITE_OTHER): Payer: BC Managed Care – PPO

## 2016-01-06 VITALS — BP 162/120 | HR 82 | Temp 98.3°F | Resp 18 | Ht 68.5 in | Wt 227.0 lb

## 2016-01-06 DIAGNOSIS — I10 Essential (primary) hypertension: Secondary | ICD-10-CM

## 2016-01-06 DIAGNOSIS — M25561 Pain in right knee: Secondary | ICD-10-CM

## 2016-01-06 DIAGNOSIS — R05 Cough: Secondary | ICD-10-CM

## 2016-01-06 DIAGNOSIS — R0989 Other specified symptoms and signs involving the circulatory and respiratory systems: Secondary | ICD-10-CM

## 2016-01-06 DIAGNOSIS — R059 Cough, unspecified: Secondary | ICD-10-CM

## 2016-01-06 DIAGNOSIS — J069 Acute upper respiratory infection, unspecified: Secondary | ICD-10-CM | POA: Diagnosis not present

## 2016-01-06 DIAGNOSIS — M25562 Pain in left knee: Secondary | ICD-10-CM

## 2016-01-06 DIAGNOSIS — E669 Obesity, unspecified: Secondary | ICD-10-CM

## 2016-01-06 DIAGNOSIS — F418 Other specified anxiety disorders: Secondary | ICD-10-CM

## 2016-01-06 DIAGNOSIS — G4710 Hypersomnia, unspecified: Secondary | ICD-10-CM | POA: Diagnosis not present

## 2016-01-06 DIAGNOSIS — F419 Anxiety disorder, unspecified: Principal | ICD-10-CM

## 2016-01-06 DIAGNOSIS — R400 Somnolence: Secondary | ICD-10-CM

## 2016-01-06 DIAGNOSIS — R0683 Snoring: Secondary | ICD-10-CM

## 2016-01-06 DIAGNOSIS — F329 Major depressive disorder, single episode, unspecified: Secondary | ICD-10-CM

## 2016-01-06 DIAGNOSIS — F17200 Nicotine dependence, unspecified, uncomplicated: Secondary | ICD-10-CM

## 2016-01-06 MED ORDER — BENZONATATE 100 MG PO CAPS: 100.0000 mg | ORAL_CAPSULE | Freq: Three times a day (TID) | ORAL | Status: DC | PRN

## 2016-01-06 MED ORDER — HYDROCODONE-HOMATROPINE 5-1.5 MG/5ML PO SYRP: 5.0000 mL | ORAL_SOLUTION | Freq: Every evening | ORAL | Status: DC | PRN

## 2016-01-06 MED ORDER — LISINOPRIL 10 MG PO TABS: 10.0000 mg | ORAL_TABLET | Freq: Every day | ORAL | Status: DC

## 2016-01-06 MED ORDER — ESCITALOPRAM OXALATE 10 MG PO TABS: 10.0000 mg | ORAL_TABLET | Freq: Every day | ORAL | Status: DC

## 2016-01-06 MED ORDER — LORAZEPAM 0.5 MG PO TABS: 0.5000 mg | ORAL_TABLET | Freq: Every day | ORAL | Status: DC | PRN

## 2016-01-06 MED ORDER — MELOXICAM 7.5 MG PO TABS: 7.5000 mg | ORAL_TABLET | Freq: Every day | ORAL | Status: DC

## 2016-01-06 MED ORDER — HYDROCHLOROTHIAZIDE 12.5 MG PO TABS: 12.5000 mg | ORAL_TABLET | Freq: Every day | ORAL | Status: DC

## 2016-01-06 NOTE — Progress Notes (Signed)
MRN: [id number] DOB: [birth year]  Subjective:   Thomas Yoder is a 55 y.o. male presenting for chief complaint of Chest Congestion; Cough; Anxiety; Knee Pain; and Depression  Cough - reports 2 day history of dry cough that elicits mild occasional left-sided chest pain; also has chest congestion, shob, chills, nasal congestion. Has not tried any medications. Smokes 1 pack per week, he is cutting back, interested in quitting, has been smoking 1 pack per week for >25 years. Denies fever, n/v, abdominal pain, sore throat.  HTN - patient was out of blood pressure medication in Oct-Nov 2016. He was recently restarted on lis-HCT but he has only been taking lis. Denies chronic headaches, dizziness, hematuria, lower leg swelling. Also admits day time sleepiness, severe snoring. Has previously been told that he may have sleep apnea but did not follow through with work up. He has also been seen by a cardiologist and underwent stress testing but is unclear about what the results were.  Depression and Anxiety - reports ~1 year history of feeling down and blue. Has had significant stressors with his mother's passing. He came here ~10 years ago to help his mother with her diagnosis of Alzheimer's disease; she passed ~1 year ago. 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 He denies suicidal or homicidal ideation. He does not have a good support network here and is trying to figure out how he can move back to [place] or near family. He has 1 adult child who lives in [place] and does not have a good relationship with them either.  Knee pain - reports 1 month history of worsening bilateral knee pain. He also reports popping of his knee. His knee pain is relieved with rest. Works as a Arboriculturist, is on his feet all day at work. Denies redness, swelling, bony deformity, history of gout.  Thomas Yoder has a current medication list which includes  the following prescription(s): lisinopril, hydrochlorothiazide, ibuprofen, and ipratropium-albuterol. Also has No Known Allergies.  Thomas Yoder  has a past medical history of Hypertension. Also  has past surgical history that includes Knee surgery.  Objective:   Vitals: BP 162/120 mmHg  Pulse 82  Temp(Src) 98.3 F (36.8 C) (Oral)  Resp 18  Ht 5' 8.5" (1.74 m)  Wt 227 lb (102.967 kg)  BMI 34.01 kg/m2  SpO2 97%  Physical Exam  Constitutional: He is oriented to person, place, and time. He appears well-developed and well-nourished.  HENT:  TM's intact bilaterally, no effusions or erythema. Nasal turbinates pink and moist, nasal passages patent. No sinus tenderness. Redundant soft palate. Otherwise, oropharynx clear.  Eyes: Right eye exhibits no discharge. Left eye exhibits no discharge. No scleral icterus.  Cardiovascular: Normal rate, regular rhythm and intact distal pulses.  Exam reveals no gallop and no friction rub.   No murmur heard. Pulmonary/Chest: No respiratory distress. He has no wheezes. He has no rales.  Abdominal: Soft. Bowel sounds are normal. He exhibits no distension and no mass. There is no tenderness.  Neurological: He is alert and oriented to person, place, and time. He has normal reflexes.  Skin: Skin is warm and dry.  Psychiatric: His mood appears anxious. His speech is not rapid and/or pressured and not slurred. He is not agitated and not slowed. He exhibits a depressed mood (tearful throughout visit especially while talking about stressors). He expresses no homicidal and no suicidal ideation.   [facility]  reading (PRIMARY) by PA-Tarvaris Puglia. Left and right knee - joint space narrowing suggestive of osteoarthritis, no acute process.  Dg Knee Complete 4 Views Left  01/06/2016  CLINICAL DATA:  Chronic worsening bilateral knee pain and popping. Initial encounter. EXAM: LEFT KNEE - COMPLETE 4+ VIEW COMPARISON:  Left knee radiographs performed 08/17/2012 FINDINGS: There is no evidence  of fracture or dislocation. The joint spaces are preserved. Tibial spine and wall osteophytes are noted. There is slight cortical irregularity along the patellofemoral compartment. No significant joint effusion is seen. The visualized soft tissues are normal in appearance. IMPRESSION: 1. No evidence of fracture or dislocation. 2. Mild osteoarthritis noted. Electronically Signed   By: Roanna Raider M.D.   On: 01/06/2016 18:34   Dg Knee Complete 4 Views Right  01/06/2016  CLINICAL DATA:  Bilateral knee pain. EXAM: RIGHT KNEE - COMPLETE 4+ VIEW COMPARISON:  09/04/2009 FINDINGS: Since the prior radiographs, there is some progressive medial joint space narrowing consistent with osteoarthritis. Moderate patellofemoral proliferative changes are progressive since the prior study. No fracture or dislocation. No bony lesions. IMPRESSION: Progression of osteoarthritis since prior imaging with medial joint space narrowing present as well as patellofemoral disease. Electronically Signed   By: Irish Lack M.D.   On: 01/06/2016 18:35   Assessment and Plan :   1. Anxiety and depression - Counseled on this diagnosis. Patient will start trial of escitalopram, lorazepam as needed for anxiety. Patient will f/u in 6 weeks.  2. Bilateral knee pain - Patient has osteoarthritis, will start meloxicam and consider referral to ortho. Will likely give patient OA reaction knee brace at his f/u.  3. Essential hypertension 4. Snoring 5. Daytime somnolence 6. Obesity - Counseled on lifestyle modifications, emphasized that patient needs to have compliance and take 2 blood pressure medications.  - Sleep study pending for probable OSA  7. Tobacco use disorder 8. Acute upper respiratory infection 9. Cough 10. Chest congestion - Counseled on smoking cessation, consider medical therapy if patient is unable to quit on his own. - Likely undergoing viral syndrome. Advised supportive care, recheck in 1 week if no improvement.  Hycodan and Tessalon for cough.  Wallis Bamberg, PA-C [facility] [phone] 01/06/2016 1:43 PM

## 2016-01-06 NOTE — Patient Instructions (Addendum)
Hypertension Hypertension, commonly called high blood pressure, is when the force of blood pumping through your arteries is too strong. Your arteries are the blood vessels that carry blood from your heart throughout your body. A blood pressure reading consists of a higher number over a lower number, such as 110/72. The higher number (systolic) is the pressure inside your arteries when your heart pumps. The lower number (diastolic) is the pressure inside your arteries when your heart relaxes. Ideally you want your blood pressure below 120/80. Hypertension forces your heart to work harder to pump blood. Your arteries may become narrow or stiff. Having untreated or uncontrolled hypertension can cause heart attack, stroke, kidney disease, and other problems. RISK FACTORS Some risk factors for high blood pressure are controllable. Others are not.  Risk factors you cannot control include:   Race. You may be at higher risk if you are African American.  Age. Risk increases with age.  Gender. Men are at higher risk than women before age 88 years. After age 105, women are at higher risk than men. Risk factors you can control include: 1. Not getting enough exercise or physical activity. 2. Being overweight. 3. Getting too much fat, sugar, calories, or salt in your diet. 4. Drinking too much alcohol. SIGNS AND SYMPTOMS Hypertension does not usually cause signs or symptoms. Extremely high blood pressure (hypertensive crisis) may cause headache, anxiety, shortness of breath, and nosebleed. DIAGNOSIS To check if you have hypertension, your health care provider will measure your blood pressure while you are seated, with your arm held at the level of your heart. It should be measured at least twice using the same arm. Certain conditions can cause a difference in blood pressure between your right and left arms. A blood pressure reading that is higher than normal on one occasion does not mean that you need  treatment. If it is not clear whether you have high blood pressure, you may be asked to return on a different day to have your blood pressure checked again. Or, you may be asked to monitor your blood pressure at home for 1 or more weeks. TREATMENT Treating high blood pressure includes making lifestyle changes and possibly taking medicine. Living a healthy lifestyle can help lower high blood pressure. You may need to change some of your habits. Lifestyle changes may include:  Following the DASH diet. This diet is high in fruits, vegetables, and whole grains. It is low in salt, red meat, and added sugars.  Keep your sodium intake below 2,300 mg per day.  Getting at least 30-45 minutes of aerobic exercise at least 4 times per week.  Losing weight if necessary.  Not smoking.  Limiting alcoholic beverages.  Learning ways to reduce stress. Your health care provider may prescribe medicine if lifestyle changes are not enough to get your blood pressure under control, and if one of the following is true:  You are 41-58 years of age and your systolic blood pressure is above 140.  You are 46 years of age or older, and your systolic blood pressure is above 150.  Your diastolic blood pressure is above 90.  You have diabetes, and your systolic blood pressure is over [id number] or your diastolic blood pressure is over 90.  You have kidney disease and your blood pressure is above 140/90.  You have heart disease and your blood pressure is above 140/90. Your personal target blood pressure may vary depending on your medical conditions, your age, and other factors. HOME CARE INSTRUCTIONS  Have your blood pressure rechecked as directed by your health care provider.   Take medicines only as directed by your health care provider. Follow the directions carefully. Blood pressure medicines must be taken as prescribed. The medicine does not work as well when you skip doses. Skipping doses also puts you at risk for  problems.  Do not smoke.   Monitor your blood pressure at home as directed by your health care provider. SEEK MEDICAL CARE IF:   You think you are having a reaction to medicines taken.  You have recurrent headaches or feel dizzy.  You have swelling in your ankles.  You have trouble with your vision. SEEK IMMEDIATE MEDICAL CARE IF:  You develop a severe headache or confusion.  You have unusual weakness, numbness, or feel faint.  You have severe chest or abdominal pain.  You vomit repeatedly.  You have trouble breathing. MAKE SURE YOU:   Understand these instructions.  Will watch your condition.  Will get help right away if you are not doing well or get worse.   This information is not intended to replace advice given to you by your health care provider. Make sure you discuss any questions you have with your health care provider.   Document Released: 12/05/2005 Document Revised: 04/21/2015 Document Reviewed: 09/27/2013 Elsevier Interactive Patient Education 2016 [employer]    Sleep Apnea  Sleep apnea is a sleep disorder characterized by abnormal pauses in breathing while you sleep. When your breathing pauses, the level of oxygen in your blood decreases. This causes you to move out of deep sleep and into light sleep. As a result, your quality of sleep is poor, and the system that carries your blood throughout your body (cardiovascular system) experiences stress. If sleep apnea remains untreated, the following conditions can develop:  High blood pressure (hypertension).  Coronary artery disease.  Inability to achieve or maintain an erection (impotence).  Impairment of your thought process (cognitive dysfunction). There are three types of sleep apnea: 5. Obstructive sleep apnea--Pauses in breathing during sleep because of a blocked airway. 6. Central sleep apnea--Pauses in breathing during sleep because the area of the brain that controls your breathing does not  send the correct signals to the muscles that control breathing. 7. Mixed sleep apnea--A combination of both obstructive and central sleep apnea. RISK FACTORS The following risk factors can increase your risk of developing sleep apnea:  Being overweight.  Smoking.  Having narrow passages in your nose and throat.  Being of older age.  Being male.  Alcohol use.  Sedative and tranquilizer use.  Ethnicity. Among individuals younger than 35 years, African Americans are at increased risk of sleep apnea. SYMPTOMS   Difficulty staying asleep.  Daytime sleepiness and fatigue.  Loss of energy.  Irritability.  Loud, heavy snoring.  Morning headaches.  Trouble concentrating.  Forgetfulness.  Decreased interest in sex.  Unexplained sleepiness. DIAGNOSIS  In order to diagnose sleep apnea, your caregiver will perform a physical examination. A sleep study done in the comfort of your own home may be appropriate if you are otherwise healthy. Your caregiver may also recommend that you spend the night in a sleep lab. In the sleep lab, several monitors record information about your heart, lungs, and brain while you sleep. Your leg and arm movements and blood oxygen level are also recorded. TREATMENT The following actions may help to resolve mild sleep apnea:  Sleeping on your side.   Using a decongestant if you have nasal congestion.  Avoiding the use of depressants, including alcohol, sedatives, and narcotics.   Losing weight and modifying your diet if you are overweight. There also are devices and treatments to help open your airway:  Oral appliances. These are custom-made mouthpieces that shift your lower jaw forward and slightly open your bite. This opens your airway.  Devices that create positive airway pressure. This positive pressure "splints" your airway open to help you breathe better during sleep. The following devices create positive airway pressure:  Continuous  positive airway pressure (CPAP) device. The CPAP device creates a continuous level of air pressure with an air pump. The air is delivered to your airway through a mask while you sleep. This continuous pressure keeps your airway open.  Nasal expiratory positive airway pressure (EPAP) device. The EPAP device creates positive air pressure as you exhale. The device consists of single-use valves, which are inserted into each nostril and held in place by adhesive. The valves create very little resistance when you inhale but create much more resistance when you exhale. That increased resistance creates the positive airway pressure. This positive pressure while you exhale keeps your airway open, making it easier to breath when you inhale again.  Bilevel positive airway pressure (BPAP) device. The BPAP device is used mainly in patients with central sleep apnea. This device is similar to the CPAP device because it also uses an air pump to deliver continuous air pressure through a mask. However, with the BPAP machine, the pressure is set at two different levels. The pressure when you exhale is lower than the pressure when you inhale.  Surgery. Typically, surgery is only done if you cannot comply with less invasive treatments or if the less invasive treatments do not improve your condition. Surgery involves removing excess tissue in your airway to create a wider passage way.   This information is not intended to replace advice given to you by your health care provider. Make sure you discuss any questions you have with your health care provider.   Document Released: 11/25/2002 Document Revised: 12/26/2014 Document Reviewed: 04/12/2012 Elsevier Interactive Patient Education 2016 [employer] Inc.    Major Depressive Disorder Major depressive disorder is a mental illness. It also may be called clinical depression or unipolar depression. Major depressive disorder usually causes feelings of sadness, hopelessness, or  helplessness. Some people with this disorder do not feel particularly sad but lose interest in doing things they used to enjoy (anhedonia). Major depressive disorder also can cause physical symptoms. It can interfere with work, school, relationships, and other normal everyday activities. The disorder varies in severity but is longer lasting and more serious than the sadness we all feel from time to time in our lives. Major depressive disorder often is triggered by stressful life events or major life changes. Examples of these triggers include divorce, loss of your job or home, a move, and the death of a family member or close friend. Sometimes this disorder occurs for no obvious reason at all. People who have family members with major depressive disorder or bipolar disorder are at higher risk for developing this disorder, with or without life stressors. Major depressive disorder can occur at any age. It may occur just once in your life (single episode major depressive disorder). It may occur multiple times (recurrent major depressive disorder). SYMPTOMS People with major depressive disorder have either anhedonia or depressed mood on nearly a daily basis for at least 2 weeks or longer. Symptoms of depressed mood include:  Feelings of sadness (blue  or down in the dumps) or emptiness.  Feelings of hopelessness or helplessness.  Tearfulness or episodes of crying (may be observed by others).  Irritability (children and adolescents). In addition to depressed mood or anhedonia or both, people with this disorder have at least four of the following symptoms: 8. Difficulty sleeping or sleeping too much.  9. Significant change (increase or decrease) in appetite or weight.  10. Lack of energy or motivation. 11. Feelings of guilt and worthlessness.  12. Difficulty concentrating, remembering, or making decisions. 13. Unusually slow movement (psychomotor retardation) or restlessness (as observed by others).   14. Recurrent wishes for death, recurrent thoughts of self-harm (suicide), or a suicide attempt. People with major depressive disorder commonly have persistent negative thoughts about themselves, other people, and the world. People with severe major depressive disorder may experiencedistorted beliefs or perceptions about the world (psychotic delusions). They also may see or hear things that are not real (psychotic hallucinations). DIAGNOSIS Major depressive disorder is diagnosed through an assessment by your health care provider. Your health care provider will ask aboutaspects of your daily life, such as mood,sleep, and appetite, to see if you have the diagnostic symptoms of major depressive disorder. Your health care provider may ask about your medical history and use of alcohol or drugs, including prescription medicines. Your health care provider also may do a physical exam and blood work. This is because certain medical conditions and the use of certain substances can cause major depressive disorder-like symptoms (secondary depression). Your health care provider also may refer you to a mental health specialist for further evaluation and treatment. TREATMENT It is important to recognize the symptoms of major depressive disorder and seek treatment. The following treatments can be prescribed for this disorder:   Medicine. Antidepressant medicines usually are prescribed. Antidepressant medicines are thought to correct chemical imbalances in the brain that are commonly associated with major depressive disorder. Other types of medicine may be added if the symptoms do not respond to antidepressant medicines alone or if psychotic delusions or hallucinations occur.  Talk therapy. Talk therapy can be helpful in treating major depressive disorder by providing support, education, and guidance. Certain types of talk therapy also can help with negative thinking (cognitive behavioral therapy) and with  relationship issues that trigger this disorder (interpersonal therapy). A mental health specialist can help determine which treatment is best for you. Most people with major depressive disorder do well with a combination of medicine and talk therapy. Treatments involving electrical stimulation of the brain can be used in situations with extremely severe symptoms or when medicine and talk therapy do not work over time. These treatments include electroconvulsive therapy, transcranial magnetic stimulation, and vagal nerve stimulation.   This information is not intended to replace advice given to you by your health care provider. Make sure you discuss any questions you have with your health care provider.   Document Released: 04/01/2013 Document Revised: 12/26/2014 Document Reviewed: 04/01/2013 Elsevier Interactive Patient Education 2016 [employer].    Escitalopram tablets What is this medicine? ESCITALOPRAM (es sye TAL oh pram) is used to treat depression and certain types of anxiety. This medicine may be used for other purposes; ask your health care provider or pharmacist if you have questions. What should I tell my health care provider before I take this medicine? They need to know if you have any of these conditions: -bipolar disorder or a family history of bipolar disorder -diabetes -glaucoma -heart disease -kidney or liver disease -receiving electroconvulsive therapy -seizures (  convulsions) -suicidal thoughts, plans, or attempt by you or a family member -an unusual or allergic reaction to escitalopram, the related drug citalopram, other medicines, foods, dyes, or preservatives -pregnant or trying to become pregnant -breast-feeding How should I use this medicine? Take this medicine by mouth with a glass of water. Follow the directions on the prescription label. You can take it with or without food. If it upsets your stomach, take it with food. Take your medicine at regular intervals.  Do not take it more often than directed. Do not stop taking this medicine suddenly except upon the advice of your doctor. Stopping this medicine too quickly may cause serious side effects or your condition may worsen. A special MedGuide will be given to you by the pharmacist with each prescription and refill. Be sure to read this information carefully each time. Talk to your pediatrician regarding the use of this medicine in children. Special care may be needed. Overdosage: If you think you have taken too much of this medicine contact a poison control center or emergency room at once. NOTE: This medicine is only for you. Do not share this medicine with others. What if I miss a dose? If you miss a dose, take it as soon as you can. If it is almost time for your next dose, take only that dose. Do not take double or extra doses. What may interact with this medicine? Do not take this medicine with any of the following medications: -certain medicines for fungal infections like fluconazole, itraconazole, ketoconazole, posaconazole, voriconazole -cisapride -citalopram -dofetilide -dronedarone -linezolid -MAOIs like Carbex, Eldepryl, Marplan, Nardil, and Parnate -methylene blue (injected into a vein) -pimozide -thioridazine -ziprasidone This medicine may also interact with the following medications: -alcohol -aspirin and aspirin-like medicines -carbamazepine -certain medicines for depression, anxiety, or psychotic disturbances -certain medicines for migraine headache like almotriptan, eletriptan, frovatriptan, naratriptan, rizatriptan, sumatriptan, zolmitriptan -certain medicines for sleep -certain medicines that treat or prevent blood clots like warfarin, enoxaparin, dalteparin -cimetidine -diuretics -fentanyl -furazolidone -isoniazid -lithium -metoprolol -NSAIDs, medicines for pain and inflammation, like ibuprofen or naproxen -other medicines that prolong the QT interval (cause an  abnormal heart rhythm) -procarbazine -rasagiline -supplements like St. John's wort, kava kava, valerian -tramadol -tryptophan This list may not describe all possible interactions. Give your health care provider a list of all the medicines, herbs, non-prescription drugs, or dietary supplements you use. Also tell them if you smoke, drink alcohol, or use illegal drugs. Some items may interact with your medicine. What should I watch for while using this medicine? Tell your doctor if your symptoms do not get better or if they get worse. Visit your doctor or health care professional for regular checks on your progress. Because it may take several weeks to see the full effects of this medicine, it is important to continue your treatment as prescribed by your doctor. Patients and their families should watch out for new or worsening thoughts of suicide or depression. Also watch out for sudden changes in feelings such as feeling anxious, agitated, panicky, irritable, hostile, aggressive, impulsive, severely restless, overly excited and hyperactive, or not being able to sleep. If this happens, especially at the beginning of treatment or after a change in dose, call your health care professional. Bonita Quin may get drowsy or dizzy. Do not drive, use machinery, or do anything that needs mental alertness until you know how this medicine affects you. Do not stand or sit up quickly, especially if you are an older patient. This reduces the risk of  dizzy or fainting spells. Alcohol may interfere with the effect of this medicine. Avoid alcoholic drinks. Your mouth may get dry. Chewing sugarless gum or sucking hard candy, and drinking plenty of water may help. Contact your doctor if the problem does not go away or is severe. What side effects may I notice from receiving this medicine? Side effects that you should report to your doctor or health care professional as soon as possible: -allergic reactions like skin rash, itching or  hives, swelling of the face, lips, or tongue -confusion -feeling faint or lightheaded, falls -fast talking and excited feelings or actions that are out of control -hallucination, loss of contact with reality -seizures -suicidal thoughts or other mood changes -unusual bleeding or bruising Side effects that usually do not require medical attention (report to your doctor or health care professional if they continue or are bothersome): -blurred vision -changes in appetite -change in sex drive or performance -headache -increased sweating -nausea This list may not describe all possible side effects. Call your doctor for medical advice about side effects. You may report side effects to FDA at 1-800-FDA-1088. Where should I keep my medicine? Keep out of reach of children. Store at room temperature between 15 and 30 degrees C (59 and 86 degrees F). Throw away any unused medicine after the expiration date. NOTE: This sheet is a summary. It may not cover all possible information. If you have questions about this medicine, talk to your doctor, pharmacist, or health care provider.    2016, Elsevier/[employer]. (2013-07-02 12:32:55)    Lorazepam tablets What is this medicine? LORAZEPAM (lor A ze pam) is a benzodiazepine. It is used to treat anxiety. This medicine may be used for other purposes; ask your health care provider or pharmacist if you have questions. What should I tell my health care provider before I take this medicine? They need to know if you have any of these conditions: -alcohol or drug abuse problem -bipolar disorder, depression, psychosis or other mental health condition -glaucoma -kidney or liver disease -lung disease or breathing difficulties -myasthenia gravis -Parkinson's disease -seizures or a history of seizures -suicidal thoughts -an unusual or allergic reaction to lorazepam, other benzodiazepines, foods, dyes, or preservatives -pregnant or trying to get  pregnant -breast-feeding How should I use this medicine? Take this medicine by mouth with a glass of water. Follow the directions on the prescription label. If it upsets your stomach, take it with food or milk. Take your medicine at regular intervals. Do not take it more often than directed. Do not stop taking except on the advice of your doctor or health care professional. Talk to your pediatrician regarding the use of this medicine in children. Special care may be needed. Overdosage: If you think you have taken too much of this medicine contact a poison control center or emergency room at once. NOTE: This medicine is only for you. Do not share this medicine with others. What if I miss a dose? If you miss a dose, take it as soon as you can. If it is almost time for your next dose, take only that dose. Do not take double or extra doses. What may interact with this medicine? -barbiturate medicines for inducing sleep or treating seizures, like phenobarbital -clozapine -medicines for depression, mental problems or psychiatric disturbances -medicines for sleep -phenytoin -probenecid -theophylline -valproic acid This list may not describe all possible interactions. Give your health care provider a list of all the medicines, herbs, non-prescription drugs, or dietary supplements you  use. Also tell them if you smoke, drink alcohol, or use illegal drugs. Some items may interact with your medicine. What should I watch for while using this medicine? Visit your doctor or health care professional for regular checks on your progress. Your body may become dependent on this medicine, ask your doctor or health care professional if you still need to take it. However, if you have been taking this medicine regularly for some time, do not suddenly stop taking it. You must gradually reduce the dose or you may get severe side effects. Ask your doctor or health care professional for advice before increasing or  decreasing the dose. Even after you stop taking this medicine it can still affect your body for several days. You may get drowsy or dizzy. Do not drive, use machinery, or do anything that needs mental alertness until you know how this medicine affects you. To reduce the risk of dizzy and fainting spells, do not stand or sit up quickly, especially if you are an older patient. Alcohol may increase dizziness and drowsiness. Avoid alcoholic drinks. Do not treat yourself for coughs, colds or allergies without asking your doctor or health care professional for advice. Some ingredients can increase possible side effects. What side effects may I notice from receiving this medicine? Side effects that you should report to your doctor or health care professional as soon as possible: -changes in vision -confusion -depression -mood changes, excitability or aggressive behavior -movement difficulty, staggering or jerky movements -muscle cramps -restlessness -weakness or tiredness Side effects that usually do not require medical attention (report to your doctor or health care professional if they continue or are bothersome): -constipation or diarrhea -difficulty sleeping, nightmares -dizziness, drowsiness -headache -nausea, vomiting This list may not describe all possible side effects. Call your doctor for medical advice about side effects. You may report side effects to FDA at 1-800-FDA-1088. Where should I keep my medicine? Keep out of the reach of children. This medicine can be abused. Keep your medicine in a safe place to protect it from theft. Do not share this medicine with anyone. Selling or giving away this medicine is dangerous and against the law. This medicine may cause accidental overdose and death if taken by other adults, children, or pets. Mix any unused medicine with a substance like cat litter or coffee grounds. Then throw the medicine away in a sealed container like a sealed bag or a coffee  can with a lid. Do not use the medicine after the expiration date. Store at room temperature between 20 and 25 degrees C (68 and 77 degrees F). Protect from light. Keep container tightly closed. NOTE: This sheet is a summary. It may not cover all possible information. If you have questions about this medicine, talk to your doctor, pharmacist, or health care provider.    2016, Elsevier/Gold Standard. (2014-08-26 15:24:21)

## 2016-01-07 ENCOUNTER — Encounter: Payer: Self-pay | Admitting: Urgent Care

## 2016-01-07 LAB — COMPLETE METABOLIC PANEL WITH GFR
ALBUMIN: 4.2 g/dL (ref 3.6–5.1)
ALT: 13 U/L (ref 9–46)
AST: 15 U/L (ref 10–35)
Alkaline Phosphatase: 101 U/L (ref 40–115)
BILIRUBIN TOTAL: 0.4 mg/dL (ref 0.2–1.2)
BUN: 15 mg/dL (ref 7–25)
CO2: 27 mmol/L (ref 20–31)
Calcium: 9.8 mg/dL (ref 8.6–10.3)
Chloride: 102 mmol/L (ref 98–110)
Creat: 1.01 mg/dL (ref 0.70–1.33)
GFR, Est Non African American: 85 mL/min (ref >=60)
Glucose, Bld: 102 mg/dL — ABNORMAL HIGH (ref 65–99)
Potassium: 4.5 mmol/L (ref 3.5–5.3)
Sodium: 139 mmol/L (ref 135–146)
TOTAL PROTEIN: 6.9 g/dL (ref 6.1–8.1)

## 2016-01-07 LAB — TSH: TSH: 1.256 u[IU]/mL (ref 0.350–4.500)

## 2016-01-21 ENCOUNTER — Ambulatory Visit (INDEPENDENT_AMBULATORY_CARE_PROVIDER_SITE_OTHER): Payer: BC Managed Care – PPO | Admitting: Urgent Care

## 2016-01-21 VITALS — BP 162/104 | HR 76 | Temp 98.0°F | Resp 18 | Ht 68.5 in | Wt 227.8 lb

## 2016-01-21 DIAGNOSIS — R0683 Snoring: Secondary | ICD-10-CM | POA: Diagnosis not present

## 2016-01-21 DIAGNOSIS — F17200 Nicotine dependence, unspecified, uncomplicated: Secondary | ICD-10-CM | POA: Diagnosis not present

## 2016-01-21 DIAGNOSIS — I10 Essential (primary) hypertension: Secondary | ICD-10-CM

## 2016-01-21 DIAGNOSIS — E669 Obesity, unspecified: Secondary | ICD-10-CM | POA: Diagnosis not present

## 2016-01-21 DIAGNOSIS — F32A Depression, unspecified: Secondary | ICD-10-CM

## 2016-01-21 DIAGNOSIS — R5383 Other fatigue: Secondary | ICD-10-CM

## 2016-01-21 DIAGNOSIS — F418 Other specified anxiety disorders: Secondary | ICD-10-CM | POA: Diagnosis not present

## 2016-01-21 DIAGNOSIS — F329 Major depressive disorder, single episode, unspecified: Secondary | ICD-10-CM

## 2016-01-21 DIAGNOSIS — F419 Anxiety disorder, unspecified: Principal | ICD-10-CM

## 2016-01-21 NOTE — Progress Notes (Signed)
MRN: [id number] DOB: 1961-07-04  Subjective:   Thomas Yoder is a 55 y.o. male presenting for chief complaint of needs note for work; follow up anxiety and depression; and positive depression screen  Depression and Anxiety - Patient requested a transfer due to his stressful work environment. 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 His request was denied by the principal and patient believes that this is because they plan on firing him instead of transferring him. Currently, patient is in a managerial/supervisor position over the other custodians and needs a letter from PCP to help prevent him being fired. He would like to keep his job but without the added responsibility of managing other custodians. He is feeling very overwhelmed financially, socially and has had a difficult time coping with his mother's loss. He has not yet started depression medication because it was not filled for him. He has been using lorazepam as prescribed, states that since he requested his transfer has been using lorazepam daily. This is the biggest stressor for him, states that he is trying to make changes in his life but his work is making this difficult for him. He is in agreement to start SSRI still just didn't know why he didn't get it filled.  HTN - managed with lisinopril. He also has HCTZ but did not know he was supposed to take this for HTN. He has started trying to eat healthily. Denies dizziness, chest pain, heart racing, shob, chronic headache, n/v, abdominal pain, hematuria, lower leg swelling. He is making significant efforts to cut back on smoking.  Tou has a current medication list which includes the following prescription(s): benzonatate, escitalopram, hydrochlorothiazide, lisinopril, lorazepam, meloxicam, and hydrocodone-homatropine. Also has No Known Allergies.  [name]  has a past medical history of Hypertension. Also  has past surgical history that includes  Knee surgery.  Objective:   Vitals: BP 162/104 mmHg  Pulse 76  Temp(Src) 98 F (36.7 C) (Oral)  Resp 18  Ht 5' 8.5" (1.74 m)  Wt 227 lb 12.8 oz (103.329 kg)  BMI 34.13 kg/m2  SpO2 98%  Physical Exam  Constitutional: He is oriented to person, place, and time. He appears well-developed and well-nourished.  Eyes: No scleral icterus.  Cardiovascular: Normal rate, regular rhythm and intact distal pulses.  Exam reveals no gallop and no friction rub.   No murmur heard. Pulmonary/Chest: No respiratory distress. He has no wheezes. He has no rales.  Neurological: He is alert and oriented to person, place, and time.  Skin: Skin is warm and dry.    Assessment and Plan :   1. Anxiety and depression - Will call pharmacy to make sure he gets escitalopram filled. Patient will start this and look to increase the dosage if no side effects. F/U by phone.  UPDATE: Called patient's pharmacy, it seems that escitalopram was too expensive and patient put it on hold. We will have patient start on citalopram, 20mg  once daily. F/u in 4 weeks.  2. Essential hypertension 3. Obesity 4. Fatigue 5. Snoring - Reviewed medications with patient. The CMA who triaged patient help label every single bottle for patient. He was grateful for this as he continues to feel stressed and overwhelmed with his life. F/U in 4 weeks. - Continue efforts at dietary modifications, provided patient with more specific choices. - Patient states that no one has contacted him for his sleep study. I advised patient to answer his phone even if it's a number he doesn't recognize.  I will request sleep study referral again.  6. Tobacco use disorder - Continue efforts at smoking cessation.  Thomas Bamberg, PA-C [facility] [phone] 01/21/2016 3:01 PM

## 2016-01-21 NOTE — Patient Instructions (Addendum)
HIGH BLOOD PRESSURE - Take hydrochlorothiazide and lisinopril every day. - Obstructive Sleep Apnea can affect this so you need to be scheduled for a sleep study. Please answer your phone from a number that you don't recognize because it could be the sleep center or our office trying to set this appointment up. - Minimize your salt consumption, no fast food, no greasy food, no frozen meals. Eat vegetables, fruits, salads, lean meats (chicken breast, tilapia, salmon, red snapper), oats, wheat. Drink at least 2 liters of water daily. No alcohol. Continue efforts to cut down on smoking and eventually quit all together.  ANXIETY AND DEPRESSION - Take escitalopram (Lexapro) every day. This is for maintenance therapy for anxiety and/or depression. - Take 10mg  of escitalopram for 2 weeks. Then if you are not having stomach upset, diarrhea, nausea, increase to 20mg  escitalopram. If you are having side effects call our office and leave a voice message for me. - You should take lorazepam (Ativan) up to once day only if you have overwhelming anxiety.

## 2016-01-23 MED ORDER — ESCITALOPRAM OXALATE 10 MG PO TABS: 10.0000 mg | ORAL_TABLET | Freq: Every day | ORAL | Status: DC

## 2016-01-29 ENCOUNTER — Telehealth: Payer: Self-pay | Admitting: Family Medicine

## 2016-01-29 NOTE — Telephone Encounter (Signed)
lmom that patient appointment has been cancelled with Marquita Palms on 02-18-2016 please to reschedule

## 2016-02-18 ENCOUNTER — Ambulatory Visit: Payer: BC Managed Care – PPO | Admitting: Urgent Care

## 2016-02-18 ENCOUNTER — Institutional Professional Consult (permissible substitution): Payer: BC Managed Care – PPO | Admitting: Neurology

## 2016-02-18 ENCOUNTER — Telehealth: Payer: Self-pay

## 2016-02-18 NOTE — Telephone Encounter (Signed)
Patient did not have copay

## 2016-02-23 ENCOUNTER — Encounter: Payer: Self-pay | Admitting: Neurology

## 2016-03-02 ENCOUNTER — Encounter (HOSPITAL_BASED_OUTPATIENT_CLINIC_OR_DEPARTMENT_OTHER): Payer: Self-pay

## 2016-10-05 ENCOUNTER — Other Ambulatory Visit: Payer: Self-pay | Admitting: Family Medicine

## 2016-10-05 ENCOUNTER — Ambulatory Visit (INDEPENDENT_AMBULATORY_CARE_PROVIDER_SITE_OTHER): Payer: BC Managed Care – PPO | Admitting: Family Medicine

## 2016-10-05 VITALS — BP 180/100 | HR 75 | Temp 98.4°F | Resp 20 | Ht 67.5 in | Wt 230.0 lb

## 2016-10-05 DIAGNOSIS — I10 Essential (primary) hypertension: Secondary | ICD-10-CM

## 2016-10-05 DIAGNOSIS — Z0000 Encounter for general adult medical examination without abnormal findings: Secondary | ICD-10-CM

## 2016-10-05 DIAGNOSIS — R9431 Abnormal electrocardiogram [ECG] [EKG]: Secondary | ICD-10-CM | POA: Diagnosis not present

## 2016-10-05 DIAGNOSIS — Z6835 Body mass index (BMI) 35.0-35.9, adult: Secondary | ICD-10-CM | POA: Diagnosis not present

## 2016-10-05 DIAGNOSIS — F17200 Nicotine dependence, unspecified, uncomplicated: Secondary | ICD-10-CM | POA: Diagnosis not present

## 2016-10-05 DIAGNOSIS — Z125 Encounter for screening for malignant neoplasm of prostate: Secondary | ICD-10-CM

## 2016-10-05 DIAGNOSIS — Z1159 Encounter for screening for other viral diseases: Secondary | ICD-10-CM

## 2016-10-05 DIAGNOSIS — IMO0001 Reserved for inherently not codable concepts without codable children: Secondary | ICD-10-CM

## 2016-10-05 DIAGNOSIS — E6609 Other obesity due to excess calories: Secondary | ICD-10-CM | POA: Diagnosis not present

## 2016-10-05 LAB — CBC
HEMATOCRIT: 42.7 % (ref 38.5–50.0)
HEMOGLOBIN: 13.9 g/dL (ref 13.2–17.1)
MCH: 25.5 pg — AB (ref 27.0–33.0)
MCHC: 32.6 g/dL (ref 32.0–36.0)
MCV: 78.3 fL — ABNORMAL LOW (ref 80.0–100.0)
MPV: 10.3 fL (ref 7.5–12.5)
Platelets: 223 10*3/uL (ref 140–400)
RBC: 5.45 MIL/uL (ref 4.20–5.80)
RDW: 16.4 % — ABNORMAL HIGH (ref 11.0–15.0)
WBC: 5.2 10*3/uL (ref 3.8–10.8)

## 2016-10-05 LAB — COMPREHENSIVE METABOLIC PANEL
ALBUMIN: 3.8 g/dL (ref 3.6–5.1)
ALK PHOS: 96 U/L (ref 40–115)
ALT: 10 U/L (ref 9–46)
AST: 11 U/L (ref 10–35)
BILIRUBIN TOTAL: 0.4 mg/dL (ref 0.2–1.2)
BUN: 16 mg/dL (ref 7–25)
CO2: 25 mmol/L (ref 20–31)
Calcium: 9.2 mg/dL (ref 8.6–10.3)
Chloride: 107 mmol/L (ref 98–110)
Creat: 0.89 mg/dL (ref 0.70–1.33)
Glucose, Bld: 131 mg/dL — ABNORMAL HIGH (ref 65–99)
Potassium: 4 mmol/L (ref 3.5–5.3)
Sodium: 140 mmol/L (ref 135–146)
Total Protein: 6.5 g/dL (ref 6.1–8.1)

## 2016-10-05 LAB — TSH: TSH: 0.97 mIU/L (ref 0.40–4.50)

## 2016-10-05 MED ORDER — ADULT BLOOD PRESSURE CUFF LG KIT: PACK | 0 refills | Status: DC

## 2016-10-05 MED ORDER — LISINOPRIL 20 MG PO TABS: 20.0000 mg | ORAL_TABLET | Freq: Every day | ORAL | 6 refills | Status: DC

## 2016-10-05 MED ORDER — ASPIRIN EC 81 MG PO TBEC: 81.0000 mg | DELAYED_RELEASE_TABLET | Freq: Every day | ORAL | 11 refills | Status: DC

## 2016-10-05 NOTE — Progress Notes (Signed)
Chief Complaint  Patient presents with  . Annual Exam    will have knee surgery next month  . Medication Refill    lisinopril    Subjective:  Thomas Yoder is a 55 y.o. male here for a health maintenance visit.  Patient is established pt  Uncontrolled Hypertension: Patient here for follow-up of elevated blood pressure. He is not exercising and is not adherent to low salt diet.  Blood pressure is not checked at home.  Cardiac symptoms none. Patient denies chest pain, dyspnea, exertional chest pressure/discomfort, fatigue, near-syncope, orthopnea and palpitations.  Cardiovascular risk factors: hypertension, male gender, obesity (BMI >= 30 kg/m2), sedentary lifestyle and smoking/ tobacco exposure. Use of agents associated with hypertension: none. History of target organ damage: none.  He reports that he has been out of his lisinopril since yesterday evening.  He reports that he normally takes it before bedtime and denies side effects of his medication.  Pt ate a burger from [employer] at 12:30pm  He does not feel any acute symptoms from his elevated blood pressure today. He does not take any other medications for hypertension.   BP Readings from Last 3 Encounters:  10/05/16 (!) 180/100  01/21/16 (!) 162/104  01/06/16 (!) 162/120   Tobacco use disorder He smokes a pack of cigarettes a week and has been trying to quit. He denies unexplained weight loss He denies sputum production or chronic cough  Patient Active Problem List   Diagnosis Date Noted  . Tobacco use disorder 09/18/2014  . Essential hypertension, benign 08/21/2012  . BMI 34.0-34.9,adult 08/21/2012  . Knee osteoarthritis 08/21/2012    Past Medical History:  Diagnosis Date  . Hypertension     Past Surgical History:  Procedure Laterality Date  . KNEE SURGERY       Outpatient Medications Prior to Visit  Medication Sig Dispense Refill  . LORazepam (ATIVAN) 0.5 MG tablet Take 1 tablet (0.5 mg total) by mouth daily  as needed for anxiety. 30 tablet 0  . lisinopril (PRINIVIL,ZESTRIL) 10 MG tablet Take 1 tablet (10 mg total) by mouth daily. 90 tablet 3  . escitalopram (LEXAPRO) 10 MG tablet Take 1 tablet (10 mg total) by mouth daily. 60 tablet 1  . hydrochlorothiazide (HYDRODIURIL) 12.5 MG tablet Take 1 tablet (12.5 mg total) by mouth daily. (Patient not taking: Reported on 10/05/2016) 90 tablet 3  . benzonatate (TESSALON) 100 MG capsule Take 1-2 capsules (100-200 mg total) by mouth 3 (three) times daily as needed for cough. 40 capsule 0  . meloxicam (MOBIC) 7.5 MG tablet Take 1-2 tablets (7.5-15 mg total) by mouth daily. 60 tablet 0   No facility-administered medications prior to visit.     No Known Allergies   Family History  Problem Relation Age of Onset  . Hypertension Mother   . Hypertension Father      Health Habits: Dental Exam: not up to date Eye Exam: not up to date Exercise: 0 times/week on average Current exercise activities: none Diet: eats fast food, does not watch his salt  Social History   Social History  . Marital status: Divorced    Spouse name: N/A  . Number of children: N/A  . Years of education: N/A   Occupational History  . Not on file.   Social History Main Topics  . Smoking status: Current Every Day Smoker    Packs/day: 0.30    Years: 3.00    Types: Cigarettes  . Smokeless tobacco: Not on file  . Alcohol  use No  . Drug use: No  . Sexual activity: Not on file   Other Topics Concern  . Not on file   Social History Narrative  . No narrative on file   History  Alcohol Use No   History  Smoking Status  . Current Every Day Smoker  . Packs/day: 0.30  . Years: 3.00  . Types: Cigarettes  Smokeless Tobacco  . Not on file   History  Drug Use No     Health Maintenance: See under health Maintenance activity for review of completion dates as well. Immunization History  Administered Date(s) Administered  . Tdap 02/28/2015      Depression  Screen-PHQ2/9 Depression screen [facility] 2/9 10/05/2016 01/21/2016 01/06/2016  Decreased Interest 0 1 1  Down, Depressed, Hopeless 0 1 2  PHQ - 2 Score 0 2 3  Altered sleeping - 2 3  Tired, decreased energy - 3 3  Change in appetite - 2 3  Feeling bad or failure about yourself  - 2 3  Trouble concentrating - 1 3  Moving slowly or fidgety/restless - 0 3  Suicidal thoughts - 0 0  PHQ-9 Score - 12 21  Difficult doing work/chores - Extremely dIfficult -      Depression Severity and Treatment Recommendations:  0-4= None  5-9= Mild / Treatment: Support, educate to call if worse; return in one month  10-14= Moderate / Treatment: Support, watchful waiting; Antidepressant or Psycotherapy  15-19= Moderately severe / Treatment: Antidepressant OR Psychotherapy  >= 20 = Major depression, severe / Antidepressant AND Psychotherapy    Review of Systems   Review of Systems  Constitutional: Negative for chills, diaphoresis, fever, malaise/fatigue and weight loss.  HENT: Negative for tinnitus.   Eyes: Negative for blurred vision and double vision.  Respiratory: Negative for cough, sputum production, shortness of breath and wheezing.   Cardiovascular: Negative for chest pain, palpitations, orthopnea, leg swelling and PND.  Gastrointestinal: Negative for abdominal pain, heartburn, nausea and vomiting.  Genitourinary: Negative for dysuria, hematuria and urgency.  Musculoskeletal: Negative for falls and myalgias.       Chronic bilateral knee pain  Skin: Negative for itching and rash.  Neurological: Negative for weakness and headaches.  Psychiatric/Behavioral: Negative for depression. The patient is nervous/anxious. The patient does not have insomnia.     See HPI for ROS as well.    Objective:   Vitals:   10/05/16 1307 10/05/16 1336  BP: (!) 192/120 (!) 180/100  Pulse: 75   Resp: 20   Temp: 98.4 F (36.9 C)   TempSrc: Oral   SpO2: 98%   Weight: 230 lb (104.3 kg)   Height: 5' 7.5" (1.715 m)      Body mass index is 35.49 kg/m.  Physical Exam  Constitutional: He is oriented to person, place, and time. He appears well-developed and well-nourished.  HENT:  Head: Normocephalic and atraumatic.  Right Ear: External ear normal.  Left Ear: External ear normal.  Eyes: Conjunctivae and EOM are normal. Right eye exhibits no discharge. Left eye exhibits no discharge.  Neck: Normal range of motion. Neck supple. No thyromegaly present.  Cardiovascular: Normal rate, regular rhythm and normal heart sounds.   No murmur heard. Pulmonary/Chest: Effort normal and breath sounds normal. No respiratory distress. He has no wheezes.  Abdominal: Soft. Bowel sounds are normal. He exhibits no distension. There is no tenderness. There is no rebound.  Abdominal girth  Neurological: He is alert and oriented to person, place, and time.  Skin: Skin is warm. He is not diaphoretic. No erythema.  Psychiatric: He has a normal mood and affect. His behavior is normal. Judgment and thought content normal.       Assessment/Plan:   Patient was seen for a health maintenance exam.  Counseled the patient on health maintenance issues. Reviewed her health mainteance schedule and ordered appropriate tests (see orders.) Counseled on regular exercise and weight management. Recommend regular eye exams and dental cleaning.   The following issues were addressed today for health maintenance:   Thomas Yoder was seen today for annual exam and medication refill.  Diagnoses and all orders for this visit:  Uncontrolled hypertension-  bp improved without intervention on recheck Asymptomatic  Discharged to pharmacy to pick up lisinopril Lisinopril dose doubled Advised to follow up with Cardiology EKG abnormal but stable compared to May 2017 Will screen for other modifiable risk factors Discussed that his uncontrolled bp needs to be corrected prior to his elective surgery   -     EKG 12-Lead -     Comprehensive metabolic  panel -     Lipid panel; Future -     Hemoglobin A1c -     Microalbumin, urine -     Ambulatory referral to Cardiology  Tobacco use disorder- smoking cessation advised, will screen for diabetes Pt is ready to make change but does not want medication or nicotine replacement -     Lipid panel; Future -     Hemoglobin A1c  Health maintenance examination -     TSH -     CBC -     Cancel: Lipid panel -     Lipid panel; Future -     Hemoglobin A1c -     POCT urinalysis dipstick  Class 2 obesity due to excess calories with serious comorbidity and body mass index (BMI) of 35.0 to 35.9 in adult -     Hemoglobin A1c  Essential hypertension -     lisinopril (PRINIVIL,ZESTRIL) 20 MG tablet; Take 1 tablet (20 mg total) by mouth daily.  Need for hepatitis C screening test -     Hepatitis C Ab Reflex HCV RNA, QUANT  Screening PSA (prostate specific antigen)- discussed screening and risk factors Pt agreed to PSA today -     PSA  Nonspecific abnormal electrocardiogram (ECG) (EKG)- will refer to Cardiology for Echocardiogram in anticipation of surgical clearance  -     Ambulatory referral to Cardiology  Other orders -     aspirin EC 81 MG tablet; Take 1 tablet (81 mg total) by mouth daily. -     Blood Pressure Monitoring (ADULT BLOOD PRESSURE CUFF LG) KIT; Check blood pressure daily and record readings.     Body mass index is 35.49 kg/m.:  Discussed the patient's BMI with patient. The BMI body mass index is 35.49 kg/m.    No future appointments.  Patient Instructions     General principles of managing blood pressure:  -  If you are taking 3 or more medications for blood pressure, or sometimes with only 2 medications, it might be better to space the medications so that some are taken in the morning and one or more in the evening. Diuretic meds (water pills) are usually taken in the morning. Some examples of diuretics are HCTZ, hydrochlorthiazide, chlorthalidone, Diazide, Maxzide,  furosemide, metolazone, or combination tablets labeled /HCT or /HCTZ.  -  It is always a good idea to have a blood pressure cuff at home or to  use a blood pressure machine at a pharmacy. You don't necessarily need to measure your blood pressure every day, but depending on how steady and stable it is, you might want to check your blood once or twice a week, or perhaps as infrequently once a month. You do want to check your blood pressure at different times of the day, in order to be sure that you are not losing good control of blood pressure in the evening if you take blood pressure medication only in the morning. In addition, sometimes check your blood pressure just before taking the medication to be sure it is staying under control throughout the day.  -  As you check blood pressures at home, write them down instead of trying to rely on memory when you see the doctor.  -  When blood pressure is written as, say, 120/80, the top number is called the systolic pressure, and the bottom number is diastolic pressure. Both are important, but the systolic blood pressure is probably more important.  -  The traditional blood pressure goal is less than 140/90, which means less than 355 systolic and also less than 90 diastolic. In 2014 a somewhat controversial guideline was issued stating that less than 150/90 is acceptable for people aged 84 years or older.   -  Lower blood pressure goals have sometimes been suggested. However, in 2010 the large ACCORD-BP research study in people with diabetes found that targeting systolic blood pressure under 120 did not give better outcomes than targeting systolic blood pressure under 140. Rates of stroke, however, were lower for the group with systolic blood pressure target under 120.  -  Blood pressures measured at home are often 10 mm of mercury (Hg) lower for systolic pressure and 5 mm Hg lower for diastolic pressure than blood pressures measured at the doctor's office. It's  important to note that blood pressure targets are based blood pressure measured at the doctor's office, so home blood pressure should be somewhat lower.  -  When is blood pressure too low? The diastolic number is not important. It is the systolic pressure that keeps blood flowing to the brain. Systolic under 90 is always too low. The same medications used to treat high blood pressure are often used to treat heart failure, or sometimes to protect the kidneys in people with diabetes, and it is fairly common to see systolic blood pressure in the 90s or 100s with these medications. If medications are used simply to treat high blood pressure, then systolic pressure that stays under 110 should usually lead to discussion about reducing blood pressure medication doses or dropping medications.  -  Weight loss is a great way to lower blood pressure. When 10 to 20 pounds are lost, doses of blood pressure medications often need to be adjusted downward, based on what we just discussed above.  -  Choice of foods can also help reduce blood pressure. Look up the DASH diet on the internet (Dietary Approaches to Stop Hypertension).     IF you received an x-ray today, you will receive an invoice from [facility]. Please contact [facility] at [phone] with questions or concerns regarding your invoice.   IF you received labwork today, you will receive an invoice from [employer]. Please contact [facility] at [phone] with questions or concerns regarding your invoice.   Our billing staff will not be able to assist you with questions regarding bills from these companies.  You will be contacted with the  lab results as soon as they are available. The fastest way to get your results is to activate your My Chart account. Instructions are located on the last page of this paperwork. If you have not heard from [place] regarding the results in 2 weeks, please contact this office.       How to Take Your Blood Pressure HOW DO I GET A BLOOD PRESSURE MACHINE?  You can buy an electronic home blood pressure machine at your local pharmacy. Insurance will sometimes cover the cost if you have a prescription.  Ask your doctor what type of machine is best for you. There are different machines for your arm and your wrist.  If you decide to buy a machine to check your blood pressure on your arm, first check the size of your arm so you can buy the right size cuff. To check the size of your arm:   Use a measuring tape that shows both inches and centimeters.   Wrap the measuring tape around the upper-middle part of your arm. You may need someone to help you measure.   Write down your arm measurement in both inches and centimeters.   To measure your blood pressure correctly, it is important to have the right size cuff.   If your arm is up to 13 inches (up to 34 centimeters), get an adult cuff size.  If your arm is 13 to 17 inches (35 to 44 centimeters), get a large adult cuff size.    If your arm is 17 to 20 inches (45 to 52 centimeters), get an adult thigh cuff.  WHAT DO THE NUMBERS MEAN?   There are two numbers that make up your blood pressure. For example: 120/80.  The first number (120 in our example) is called the "systolic pressure." It is a measure of the pressure in your blood vessels when your heart is pumping blood.  The second number (80 in our example) is called the "diastolic pressure." It is a measure of the pressure in your blood vessels when your heart is resting between beats.  Your doctor will tell you what your blood pressure should be. WHAT SHOULD I DO BEFORE I CHECK MY BLOOD PRESSURE?   Try to rest or relax for at least 30 minutes before you check your blood pressure.  Do not smoke.  Do not have any drinks with caffeine, such as:  Soda.  Coffee.  Tea.  Check your blood pressure in a quiet room.  Sit down and stretch out your arm on  a table. Keep your arm at about the level of your heart. Let your arm relax.  Make sure that your legs are not crossed. HOW DO I CHECK MY BLOOD PRESSURE?  Follow the directions that came with your machine.  Make sure you remove any tight-fitting clothing from your arm or wrist. Wrap the cuff around your upper arm or wrist. You should be able to fit a finger between the cuff and your arm. If you cannot fit a finger between the cuff and your arm, it is too tight and should be removed and rewrapped.  Some units require you to manually pump up the arm cuff.  Automatic units inflate the cuff when you press a button.  Cuff deflation is automatic in both models.  After the cuff is inflated, the unit measures your blood pressure and pulse. The readings are shown on a monitor. Hold still and breathe normally while the cuff is inflated.  Getting a reading  takes less than a minute.  Some models store readings in a memory. Some provide a printout of readings. If your machine does not store your readings, keep a written record.  Take readings with you to your next visit with your doctor.   This information is not intended to replace advice given to you by your health care provider. Make sure you discuss any questions you have with your health care provider.   Document Released: 11/17/2008 Document Revised: 12/26/2014 Document Reviewed: 01/30/2014 Elsevier Interactive Patient Education [employer].

## 2016-10-05 NOTE — Patient Instructions (Addendum)
General principles of managing blood pressure:  -  If you are taking 3 or more medications for blood pressure, or sometimes with only 2 medications, it might be better to space the medications so that some are taken in the morning and one or more in the evening. Diuretic meds (water pills) are usually taken in the morning. Some examples of diuretics are HCTZ, hydrochlorthiazide, chlorthalidone, Diazide, Maxzide, furosemide, metolazone, or combination tablets labeled /HCT or /HCTZ.  -  It is always a good idea to have a blood pressure cuff at home or to use a blood pressure machine at a pharmacy. You don't necessarily need to measure your blood pressure every day, but depending on how steady and stable it is, you might want to check your blood once or twice a week, or perhaps as infrequently once a month. You do want to check your blood pressure at different times of the day, in order to be sure that you are not losing good control of blood pressure in the evening if you take blood pressure medication only in the morning. In addition, sometimes check your blood pressure just before taking the medication to be sure it is staying under control throughout the day.  -  As you check blood pressures at home, write them down instead of trying to rely on memory when you see the doctor.  -  When blood pressure is written as, say, 120/80, the top number is called the systolic pressure, and the bottom number is diastolic pressure. Both are important, but the systolic blood pressure is probably more important.  -  The traditional blood pressure goal is less than 140/90, which means less than 140 systolic and also less than 90 diastolic. In 2014 a somewhat controversial guideline was issued stating that less than 150/90 is acceptable for people aged 54 years or older.   -  Lower blood pressure goals have sometimes been suggested. However, in 2010 the large ACCORD-BP research study in people with diabetes found that  targeting systolic blood pressure under 469120 did not give better outcomes than targeting systolic blood pressure under 629140. Rates of stroke, however, were lower for the group with systolic blood pressure target under 120.  -  Blood pressures measured at home are often 10 mm of mercury (Hg) lower for systolic pressure and 5 mm Hg lower for diastolic pressure than blood pressures measured at the doctor's office. It's important to note that blood pressure targets are based blood pressure measured at the doctor's office, so home blood pressure should be somewhat lower.  -  When is blood pressure too low? The diastolic number is not important. It is the systolic pressure that keeps blood flowing to the brain. Systolic under 90 is always too low. The same medications used to treat high blood pressure are often used to treat heart failure, or sometimes to protect the kidneys in people with diabetes, and it is fairly common to see systolic blood pressure in the 90s or 100s with these medications. If medications are used simply to treat high blood pressure, then systolic pressure that stays under 110 should usually lead to discussion about reducing blood pressure medication doses or dropping medications.  -  Weight loss is a great way to lower blood pressure. When 10 to 20 pounds are lost, doses of blood pressure medications often need to be adjusted downward, based on what we just discussed above.  -  Choice of foods can also help reduce blood pressure. Look up  the DASH diet on the internet (Dietary Approaches to Stop Hypertension).     IF you received an x-ray today, you will receive an invoice from [facility]. Please contact [facility] at [phone] with questions or concerns regarding your invoice.   IF you received labwork today, you will receive an invoice from [employer]. Please contact [facility] at [phone] with questions or concerns regarding your  invoice.   Our billing staff will not be able to assist you with questions regarding bills from these companies.  You will be contacted with the lab results as soon as they are available. The fastest way to get your results is to activate your My Chart account. Instructions are located on the last page of this paperwork. If you have not heard from [place] regarding the results in 2 weeks, please contact this office.      How to Take Your Blood Pressure HOW DO I GET A BLOOD PRESSURE MACHINE?  You can buy an electronic home blood pressure machine at your local pharmacy. Insurance will sometimes cover the cost if you have a prescription.  Ask your doctor what type of machine is best for you. There are different machines for your arm and your wrist.  If you decide to buy a machine to check your blood pressure on your arm, first check the size of your arm so you can buy the right size cuff. To check the size of your arm:   Use a measuring tape that shows both inches and centimeters.   Wrap the measuring tape around the upper-middle part of your arm. You may need someone to help you measure.   Write down your arm measurement in both inches and centimeters.   To measure your blood pressure correctly, it is important to have the right size cuff.   If your arm is up to 13 inches (up to 34 centimeters), get an adult cuff size.  If your arm is 13 to 17 inches (35 to 44 centimeters), get a large adult cuff size.    If your arm is 17 to 20 inches (45 to 52 centimeters), get an adult thigh cuff.  WHAT DO THE NUMBERS MEAN?   There are two numbers that make up your blood pressure. For example: 120/80.  The first number (120 in our example) is called the "systolic pressure." It is a measure of the pressure in your blood vessels when your heart is pumping blood.  The second number (80 in our example) is called the "diastolic pressure." It is a measure of the pressure in your blood vessels when  your heart is resting between beats.  Your doctor will tell you what your blood pressure should be. WHAT SHOULD I DO BEFORE I CHECK MY BLOOD PRESSURE?   Try to rest or relax for at least 30 minutes before you check your blood pressure.  Do not smoke.  Do not have any drinks with caffeine, such as:  Soda.  Coffee.  Tea.  Check your blood pressure in a quiet room.  Sit down and stretch out your arm on a table. Keep your arm at about the level of your heart. Let your arm relax.  Make sure that your legs are not crossed. HOW DO I CHECK MY BLOOD PRESSURE?  Follow the directions that came with your machine.  Make sure you remove any tight-fitting clothing from your arm or wrist. Wrap the cuff around your upper arm or wrist. You should be able to  fit a finger between the cuff and your arm. If you cannot fit a finger between the cuff and your arm, it is too tight and should be removed and rewrapped.  Some units require you to manually pump up the arm cuff.  Automatic units inflate the cuff when you press a button.  Cuff deflation is automatic in both models.  After the cuff is inflated, the unit measures your blood pressure and pulse. The readings are shown on a monitor. Hold still and breathe normally while the cuff is inflated.  Getting a reading takes less than a minute.  Some models store readings in a memory. Some provide a printout of readings. If your machine does not store your readings, keep a written record.  Take readings with you to your next visit with your doctor.   This information is not intended to replace advice given to you by your health care provider. Make sure you discuss any questions you have with your health care provider.   Document Released: 11/17/2008 Document Revised: 12/26/2014 Document Reviewed: 01/30/2014 Elsevier Interactive Patient Education [employer]

## 2016-10-06 LAB — HEMOGLOBIN A1C
Hgb A1c MFr Bld: 6.1 % — ABNORMAL HIGH (ref <5.7)
MEAN PLASMA GLUCOSE: 128 mg/dL

## 2016-10-06 LAB — PSA: PSA: 1.3 ng/mL (ref <=4.0)

## 2016-10-06 LAB — HEPATITIS C ANTIBODY: HCV Ab: NEGATIVE

## 2016-10-06 LAB — MICROALBUMIN, URINE: Microalb, Ur: 0.8 mg/dL

## 2016-10-07 ENCOUNTER — Other Ambulatory Visit: Payer: Self-pay | Admitting: Orthopaedic Surgery

## 2016-10-12 ENCOUNTER — Telehealth: Payer: Self-pay | Admitting: Emergency Medicine

## 2016-10-12 NOTE — Telephone Encounter (Signed)
-----   Message from Doristine Bosworth, MD sent at 10/10/2016  2:26 PM EDT ----- Please let the patient know that he has normal PSA levels, normal thyroid, normal liver and kidney function, normal hemoglobin so no anemia. Hep C screen negative.  Advise him that his hemoglobin A1c shows that he is prediabetic. Would advise that he try to lose 1-2 pounds per month and cut back on carbohydrates.  If the hemoglobin a1c does not improve we may have to use a medication to treat this to prevent diabetes.  He should return in 3 months.

## 2016-10-13 ENCOUNTER — Other Ambulatory Visit: Payer: Self-pay | Admitting: Orthopaedic Surgery

## 2016-10-17 NOTE — Progress Notes (Signed)
(  Electrophysiology Office Note   Date:  10/18/2016   ID:  [name], DOB 05/13/1961, MRN [id number]  PCP:  Jenny Reichmann, MD  Cardiologist:   Constance Haw, MD    Chief Complaint  Patient presents with  . New Patient (Initial Visit)    Uncontrolled Hypertension     History of Present Illness: Thomas Yoder is a 55 y.o. male who presents today for electrophysiology evaluation.   History of hypertension, tobacco abuse, obesity. She is planned have knee surgery in the future. He presented to urgent care for a health screening, and was found to have quite elevated blood pressures. His blood pressure today in the office are also quite elevated. He has not yet picked up his prescriptions, but is planning on doing that today. He has not had any chest pain, headaches, blurry vision, or dizziness.   Today, he denies symptoms of palpitations, chest pain, shortness of breath, orthopnea, PND, lower extremity edema, claudication, dizziness, presyncope, syncope, bleeding, or neurologic sequela. The patient is tolerating medications without difficulties and is otherwise without complaint today.    Past Medical History:  Diagnosis Date  . Hypertension    Past Surgical History:  Procedure Laterality Date  . KNEE SURGERY       Current Outpatient Prescriptions  Medication Sig Dispense Refill  . Blood Pressure Monitoring (ADULT BLOOD PRESSURE CUFF LG) KIT Check blood pressure daily and record readings. 1 each 0  . escitalopram (LEXAPRO) 10 MG tablet Take 1 tablet (10 mg total) by mouth daily. 60 tablet 1  . hydrochlorothiazide (HYDRODIURIL) 12.5 MG tablet Take 1 tablet (12.5 mg total) by mouth daily. 90 tablet 3  . LORazepam (ATIVAN) 0.5 MG tablet Take 1 tablet (0.5 mg total) by mouth daily as needed for anxiety. 30 tablet 0  . lisinopril (PRINIVIL,ZESTRIL) 20 MG tablet Take 1 tablet (20 mg total) by mouth daily. 30 tablet 6   No current facility-administered medications for this  visit.     Allergies:   Review of patient's allergies indicates no known allergies.   Social History:  The patient  reports that he has been smoking Cigarettes.  He has a 0.90 pack-year smoking history. He does not have any smokeless tobacco history on file. He reports that he does not drink alcohol or use drugs.   Family History:  The patient's family history includes Hypertension in his father and mother.    ROS:  Please see the history of present illness.   Otherwise, review of systems is positive for none.   All other systems are reviewed and negative.    PHYSICAL EXAM: VS:  BP (!) 200/121   Pulse 68   Ht 5' 7.5" (1.715 m)   Wt 229 lb (103.9 kg)   BMI 35.34 kg/m  , BMI Body mass index is 35.34 kg/m. GEN: Well nourished, well developed, in no acute distress  HEENT: normal  Neck: no JVD, carotid bruits, or masses Cardiac: RRR; no murmurs, rubs, or gallops,no edema  Respiratory:  clear to auscultation bilaterally, normal work of breathing GI: soft, nontender, nondistended, + BS MS: no deformity or atrophy  Skin: warm and dry Neuro:  Strength and sensation are intact Psych: euthymic mood, full affect  EKG:  EKG is not ordered today. Personal review of the ekg ordered shows sinus rhythm, LVH with diffuse TWI  Recent Labs: 10/05/2016: ALT 10; BUN 16; Creat 0.89; Hemoglobin 13.9; Platelets 223; Potassium 4.0; Sodium 140; TSH 0.97    Lipid Panel  Component Value Date/Time   CHOL 210 (H) 08/21/2012 1415   TRIG 531 (H) 08/21/2012 1415   HDL 31 (L) 08/21/2012 1415   CHOLHDL 6.8 08/21/2012 1415   VLDL NOT CALC 08/21/2012 1415   LDLCALC  08/21/2012 1415     Comment:       Not calculated due to Triglyceride >400. Suggest ordering Direct LDL (Unit Code: 12240).   Total Cholesterol/HDL Ratio:CHD Risk                        Coronary Heart Disease Risk Table                                        Men       Women          1/2 Average Risk              3.4        3.3               Average Risk              5.0        4.4           2X Average Risk              9.6        7.1           3X Average Risk             23.4       11.0 Use the calculated Patient Ratio above and the CHD Risk table  to determine the patient's CHD Risk. ATP III Classification (LDL):       < 100        mg/dL         Optimal      018 - 129     mg/dL         Near or Above Optimal      130 - 159     mg/dL         Borderline High      160 - 189     mg/dL         High       > 097        mg/dL         Very High       Wt Readings from Last 3 Encounters:  10/18/16 229 lb (103.9 kg)  10/05/16 230 lb (104.3 kg)  01/21/16 227 lb 12.8 oz (103.3 kg)      Other studies Reviewed: Additional studies/ records that were reviewed today include: Epic notes   ASSESSMENT AND PLAN:  1.  Hypertension: Blood pressure is quite elevated today, but he has not yet had any of his medications. He was recently prescribed 20 mg of lisinopril as well as HCTZ. It is likely that he Kymora Sciara need more medications to better control his blood pressure. We Arnitra Sokoloski have him return to clinic on Friday for a blood pressure recheck. I've also encouraged him to monitor his blood pressure at home.  2. Preoperative evaluation: Currently with his high blood pressure, would not recommend surgery at this time. We Aamira Bischoff work to get his blood pressure under better control. Once his blood pressure is under better control. He would likely be at intermediate  risk for an intermediate risk procedure. He has not had any evidence of chest pain, or shortness of breath, and his EKG abnormalities could be due to his LVH.    Current medicines are reviewed at length with the patient today.   The patient does not have concerns regarding his medicines.  The following changes were made today:  none  Labs/ tests ordered today include:  No orders of the defined types were placed in this encounter.    Disposition:   FU with Ramiyah Mcclenahan 3  months  Signed, Chestine Belknap Meredith Leeds, MD  10/18/2016 10:03 AM     [facility] [address] [place] [address] [phone] (office) [phone] (fax)

## 2016-10-18 ENCOUNTER — Encounter: Payer: Self-pay | Admitting: *Deleted

## 2016-10-18 ENCOUNTER — Encounter: Payer: Self-pay | Admitting: Cardiology

## 2016-10-18 ENCOUNTER — Ambulatory Visit (INDEPENDENT_AMBULATORY_CARE_PROVIDER_SITE_OTHER): Payer: BC Managed Care – PPO | Admitting: Cardiology

## 2016-10-18 VITALS — BP 200/121 | HR 68 | Ht 67.5 in | Wt 229.0 lb

## 2016-10-18 DIAGNOSIS — R9431 Abnormal electrocardiogram [ECG] [EKG]: Secondary | ICD-10-CM | POA: Diagnosis not present

## 2016-10-18 DIAGNOSIS — I10 Essential (primary) hypertension: Secondary | ICD-10-CM

## 2016-10-18 DIAGNOSIS — Z01810 Encounter for preprocedural cardiovascular examination: Secondary | ICD-10-CM | POA: Diagnosis not present

## 2016-10-18 MED ORDER — LISINOPRIL 20 MG PO TABS: 20.0000 mg | ORAL_TABLET | Freq: Every day | ORAL | 6 refills | Status: DC

## 2016-10-18 NOTE — Patient Instructions (Addendum)
Medication Instructions:    Your physician recommends that you continue on your current medications as directed. Please refer to the Current Medication list given to you today. ------  Please start the Lisinopril that your primary doctor started you on ------  --- If you need a refill on your cardiac medications before your next appointment, please call your pharmacy. ---  Labwork:  None ordered  Testing/Procedures:  None ordered  Follow-Up:  Your physician recommends that you schedule a follow-up appointment on Friday for BP check in the hypertension clinic.    Your physician recommends that you schedule a follow-up appointment in: 3 months with Dr. Elberta Fortis.  Thank you for choosing [facility]!!   Dory Horn, RN [phone]  Any Other Special Instructions Will Be Listed Below (If Applicable). Managing Your High Blood Pressure Blood pressure is a measurement of how forceful your blood is pressing against the walls of the arteries. Arteries are muscular tubes within the circulatory system. Blood pressure does not stay the same. Blood pressure rises when you are active, excited, or nervous; and it lowers during sleep and relaxation. If the numbers measuring your blood pressure stay above normal most of the time, you are at risk for health problems. High blood pressure (hypertension) is a long-term (chronic) condition in which blood pressure is elevated. A blood pressure reading is recorded as two numbers, such as 120 over 80 (or 120/80). The first, higher number is called the systolic pressure. It is a measure of the pressure in your arteries as the heart beats. The second, lower number is called the diastolic pressure. It is a measure of the pressure in your arteries as the heart relaxes between beats.  Keeping your blood pressure in a normal range is important to your overall health and prevention of health problems, such as heart disease and stroke. When your blood pressure  is uncontrolled, your heart has to work harder than normal. High blood pressure is a very common condition in adults because blood pressure tends to rise with age. Men and women are equally likely to have hypertension but at different times in life. Before age 33, men are more likely to have hypertension. After 54 years of age, women are more likely to have it. Hypertension is especially common in African Americans. This condition often has no signs or symptoms. The cause of the condition is usually not known. Your caregiver can help you come up with a plan to keep your blood pressure in a normal, healthy range. BLOOD PRESSURE STAGES Blood pressure is classified into four stages: normal, prehypertension, stage 1, and stage 2. Your blood pressure reading will be used to determine what type of treatment, if any, is necessary. Appropriate treatment options are tied to these four stages:  Normal  Systolic pressure (mm Hg): below 120.  Diastolic pressure (mm Hg): below 80. Prehypertension  Systolic pressure (mm Hg): 120 to 139.  Diastolic pressure (mm Hg): 80 to 89. Stage1  Systolic pressure (mm Hg): 140 to 159.  Diastolic pressure (mm Hg): 90 to 99. Stage2  Systolic pressure (mm Hg): 160 or above.  Diastolic pressure (mm Hg): 100 or above. RISKS RELATED TO HIGH BLOOD PRESSURE Managing your blood pressure is an important responsibility. Uncontrolled high blood pressure can lead to:  A heart attack.  A stroke.  A weakened blood vessel (aneurysm).  Heart failure.  Kidney damage.  Eye damage.  Metabolic syndrome.  Memory and concentration problems. HOW TO MANAGE YOUR BLOOD PRESSURE Blood pressure  can be managed effectively with lifestyle changes and medicines (if needed). Your caregiver will help you come up with a plan to bring your blood pressure within a normal range. Your plan should include the following: Education  Read all information provided by your caregivers about  how to control blood pressure.  Educate yourself on the latest guidelines and treatment recommendations. New research is always being done to further define the risks and treatments for high blood pressure. Lifestylechanges  Control your weight.  Avoid smoking.  Stay physically active.  Reduce the amount of salt in your diet.  Reduce stress.  Control any chronic conditions, such as high cholesterol or diabetes.  Reduce your alcohol intake. Medicines  Several medicines (antihypertensive medicines) are available, if needed, to bring blood pressure within a normal range. Communication  Review all the medicines you take with your caregiver because there may be side effects or interactions.  Talk with your caregiver about your diet, exercise habits, and other lifestyle factors that may be contributing to high blood pressure.  See your caregiver regularly. Your caregiver can help you create and adjust your plan for managing high blood pressure. RECOMMENDATIONS FOR TREATMENT AND FOLLOW-UP  The following recommendations are based on current guidelines for managing high blood pressure in nonpregnant adults. Use these recommendations to identify the proper follow-up period or treatment option based on your blood pressure reading. You can discuss these options with your caregiver.  Systolic pressure of 120 to 139 or diastolic pressure of 80 to 89: Follow up with your caregiver as directed.  Systolic pressure of 140 to 160 or diastolic pressure of 90 to 100: Follow up with your caregiver within 2 months.  Systolic pressure above 160 or diastolic pressure above 100: Follow up with your caregiver within 1 month.  Systolic pressure above 180 or diastolic pressure above 110: Consider antihypertensive therapy; follow up with your caregiver within 1 week.  Systolic pressure above 200 or diastolic pressure above 120: Begin antihypertensive therapy; follow up with your caregiver within 1 week.     This information is not intended to replace advice given to you by your health care provider. Make sure you discuss any questions you have with your health care provider.   Document Released: 08/29/2012 Document Reviewed: 08/29/2012 Elsevier Interactive Patient Education [employer]2016 [employer]

## 2016-10-20 NOTE — Progress Notes (Signed)
Patient ID: Thomas Yoder                 DOB: 06-27-1961                      MRN: [id number]     HPI: Thomas Yoder is a 55 y.o. male referred by Dr. Curt Bears to HTN clinic. PMH of HTN and knee OA. Patient was seen for an EP evaluation on 10/31 and it was noticed there that his BP was significantly elevated. His BP was 200/121, but he did admit to not picking up his new lisinopril prescription yet and said he would do so that day. The lisinopril dose was increased from 10 mg daily to 20 mg daily on 10/18. Patient is also currently taking HCTZ 12.5 mg daily. BP has been trending up since February 2017.  In clinic today, patient reports that he has filled his new prescription for lisinopril and that he had only missed 2 total days. He reports adherence with his lisinopril and HCTZ every day. He does not check BP at home. He reports that he has a lot of stress at work which causes his BP to go up. He denies CP, blurred vision, HA, dizziness and falls. Pt states that he has an upcoming knee operation on 11/14 - he is worried about his BP being too high for the operation.  Clinic reading: BP 158/102. HR 68  Current HTN meds: lisinopril 20 mg daily, HCTZ 12.5 mg daily BP goal: <140/90 mmHg  Family History: HTN (mother and father)   Social History: Patient has cut down on smoking with the intention to quit. He smokes 3-4 cigarettes a week. He used to smoke about 0.3 packs/day for the last 3 years. He states he doesn't have a specific motivation to quit, just desires to on his own with no pharmacological aid. Denies alcohol and illicit drug use.   Diet: Endorses eating a lot cubed steaks, fried chicken, hot dogs, potato chips, but denies adding any extra salt to food.  Exercise: Does not work out due to time constraints.   Wt Readings from Last 3 Encounters:  10/18/16 229 lb (103.9 kg)  10/05/16 230 lb (104.3 kg)  01/21/16 227 lb 12.8 oz (103.3 kg)   BP Readings from Last 3 Encounters:  10/18/16  (!) 200/121  10/05/16 (!) 180/100  01/21/16 (!) 162/104   Pulse Readings from Last 3 Encounters:  10/18/16 68  10/05/16 75  01/21/16 76    Renal function: Estimated Creatinine Clearance: 109.9 mL/min (by C-G formula based on SCr of 0.89 mg/dL).  Past Medical History:  Diagnosis Date  . Hypertension     Current Outpatient Prescriptions on File Prior to Visit  Medication Sig Dispense Refill  . Blood Pressure Monitoring (ADULT BLOOD PRESSURE CUFF LG) KIT Check blood pressure daily and record readings. 1 each 0  . escitalopram (LEXAPRO) 10 MG tablet Take 1 tablet (10 mg total) by mouth daily. 60 tablet 1  . hydrochlorothiazide (HYDRODIURIL) 12.5 MG tablet Take 1 tablet (12.5 mg total) by mouth daily. 90 tablet 3  . lisinopril (PRINIVIL,ZESTRIL) 20 MG tablet Take 1 tablet (20 mg total) by mouth daily. 30 tablet 6  . LORazepam (ATIVAN) 0.5 MG tablet Take 1 tablet (0.5 mg total) by mouth daily as needed for anxiety. 30 tablet 0  . [DISCONTINUED] lisinopril-hydrochlorothiazide (PRINZIDE,ZESTORETIC) 10-12.5 MG per tablet Take 1 tablet by mouth daily. 90 tablet 3  . [DISCONTINUED] metoprolol succinate (TOPROL-XL) 50 MG  24 hr tablet Take 1 tablet (50 mg total) by mouth daily. Take with or immediately following a meal. (Patient not taking: Reported on 11/03/2015) 90 tablet 3   No current facility-administered medications on file prior to visit.     No Known Allergies   Assessment/Plan:  1. HTN: BP has improved since starting lisinopril 20 mg in conjunction with the HCTZ 12.5 mg he was already taking. BP still above goal <140/40mHg. Will maximize current antihypertensive therapy by increasing HCTZ to 25 mg daily and lisinopril to 40 mg daily. Will check BMET and BP in 10 days prior to his knee surgery to ensure BP is low enough for procedure. Counseled patient on lifestyle modifications. He will work to reduce sodium intake by limiting the number of chips he consumes and by checking labels  for sodium content. Also encouraged exercise. Will send in 3 month refills on HTN medications at next visit if BMET is stable to help with medication adherence.  2. Smoking Cessation: Patient is motivated to quit smoking. He is currently cutting back on the number of cigarettes he smokes each week (now smokes 3-4 cigarettes per week). He wishes to quit on his own without the help of pharmacological aids. Congratulated pt on his success in cutting back and will continue to monitor his progress at future visits.    Aahna Rossa E. Frank Pilger, PharmD, [id number] [place] [id number]: [phone] Fax: [phone]/02/2016 10:20 AM

## 2016-10-21 ENCOUNTER — Encounter: Payer: Self-pay | Admitting: Pharmacist

## 2016-10-21 ENCOUNTER — Ambulatory Visit (INDEPENDENT_AMBULATORY_CARE_PROVIDER_SITE_OTHER): Payer: BC Managed Care – PPO | Admitting: Pharmacist

## 2016-10-21 VITALS — BP 158/102 | HR 68

## 2016-10-21 DIAGNOSIS — F17200 Nicotine dependence, unspecified, uncomplicated: Secondary | ICD-10-CM

## 2016-10-21 DIAGNOSIS — I10 Essential (primary) hypertension: Secondary | ICD-10-CM | POA: Diagnosis not present

## 2016-10-21 NOTE — Patient Instructions (Addendum)
Increase your hydrochlorothiazide to 25mg  daily. You can take 2 of your 12.5mg  tablets at the same time each day. Increase your lisinopril to 40mg  daily. You can take 2 of your 20mg  tablets at the same time each day.   Cut back on chips and sodium in your diet.  Follow up lab work and blood pressure on 11/13 at 9am.

## 2016-10-24 ENCOUNTER — Encounter (HOSPITAL_COMMUNITY): Payer: Self-pay

## 2016-10-24 ENCOUNTER — Encounter (HOSPITAL_COMMUNITY)
Admission: RE | Admit: 2016-10-24 | Discharge: 2016-10-24 | Disposition: A | Discharge status: Home / Self Care | Payer: BC Managed Care – PPO | Source: Ambulatory Visit | Attending: Orthopaedic Surgery | Admitting: Orthopaedic Surgery

## 2016-10-24 ENCOUNTER — Ambulatory Visit (HOSPITAL_COMMUNITY)
Admission: RE | Admit: 2016-10-24 | Discharge: 2016-10-24 | Disposition: A | Discharge status: Home / Self Care | Payer: BC Managed Care – PPO | Source: Ambulatory Visit | Attending: Orthopaedic Surgery | Admitting: Orthopaedic Surgery

## 2016-10-24 DIAGNOSIS — R918 Other nonspecific abnormal finding of lung field: Secondary | ICD-10-CM | POA: Diagnosis not present

## 2016-10-24 DIAGNOSIS — Z01818 Encounter for other preprocedural examination: Secondary | ICD-10-CM | POA: Diagnosis present

## 2016-10-24 DIAGNOSIS — Z01812 Encounter for preprocedural laboratory examination: Secondary | ICD-10-CM | POA: Diagnosis not present

## 2016-10-24 DIAGNOSIS — M1712 Unilateral primary osteoarthritis, left knee: Secondary | ICD-10-CM | POA: Insufficient documentation

## 2016-10-24 HISTORY — DX: Depression, unspecified: F32.A

## 2016-10-24 HISTORY — DX: Major depressive disorder, single episode, unspecified: F32.9

## 2016-10-24 LAB — SURGICAL PCR SCREEN
MRSA, PCR: NEGATIVE
STAPHYLOCOCCUS AUREUS: NEGATIVE

## 2016-10-24 LAB — CBC WITH DIFFERENTIAL/PLATELET
Basophils Absolute: 0 10*3/uL (ref 0.0–0.1)
Basophils Relative: 1 %
Eosinophils Absolute: 0.1 10*3/uL (ref 0.0–0.7)
Eosinophils Relative: 2 %
HEMATOCRIT: 45.3 % (ref 39.0–52.0)
HEMOGLOBIN: 14.4 g/dL (ref 13.0–17.0)
LYMPHS ABS: 2.3 10*3/uL (ref 0.7–4.0)
LYMPHS PCT: 44 %
MCH: 25.7 pg — AB (ref 26.0–34.0)
MCHC: 31.8 g/dL (ref 30.0–36.0)
MCV: 80.9 fL (ref 78.0–100.0)
Monocytes Absolute: 0.4 10*3/uL (ref 0.1–1.0)
Monocytes Relative: 7 %
NEUTROS ABS: 2.4 10*3/uL (ref 1.7–7.7)
NEUTROS PCT: 46 %
Platelets: 181 10*3/uL (ref 150–400)
RBC: 5.6 MIL/uL (ref 4.22–5.81)
RDW: 15.4 % (ref 11.5–15.5)
WBC: 5.2 10*3/uL (ref 4.0–10.5)

## 2016-10-24 LAB — BASIC METABOLIC PANEL
ANION GAP: 8 (ref 5–15)
BUN: 14 mg/dL (ref 6–20)
CHLORIDE: 107 mmol/L (ref 101–111)
CO2: 26 mmol/L (ref 22–32)
Calcium: 9.6 mg/dL (ref 8.9–10.3)
Creatinine, Ser: 1.16 mg/dL (ref 0.61–1.24)
GFR calc non Af Amer: >60 mL/min (ref >60)
Glucose, Bld: 115 mg/dL — ABNORMAL HIGH (ref 65–99)
Potassium: 4.2 mmol/L (ref 3.5–5.1)
Sodium: 141 mmol/L (ref 135–145)

## 2016-10-24 LAB — URINALYSIS, ROUTINE W REFLEX MICROSCOPIC
Bilirubin Urine: NEGATIVE
GLUCOSE, UA: NEGATIVE mg/dL
KETONES UR: NEGATIVE mg/dL
LEUKOCYTES UA: NEGATIVE
NITRITE: NEGATIVE
PROTEIN: NEGATIVE mg/dL
Specific Gravity, Urine: >1.03 — ABNORMAL HIGH (ref 1.005–1.030)
pH: 6 (ref 5.0–8.0)

## 2016-10-24 LAB — TYPE AND SCREEN
ABO/RH(D): O POS
Antibody Screen: NEGATIVE

## 2016-10-24 LAB — PROTIME-INR
INR: 0.9
Prothrombin Time: 12.1 seconds (ref 11.4–15.2)

## 2016-10-24 LAB — APTT: aPTT: 35 seconds (ref 24–36)

## 2016-10-24 LAB — ABO/RH: ABO/RH(D): O POS

## 2016-10-24 LAB — URINE MICROSCOPIC-ADD ON

## 2016-10-24 NOTE — Pre-Procedure Instructions (Signed)
    Thomas Yoder  10/24/2016      [id number] - [place], [place] - [address] AT [facility] [address] [facility] [address] [place] [address] Phone: [phone] Fax: [phone]  [facility] - [place], [place] - [address] [place] [address] Phone: [phone] Fax: [phone]    Your procedure is scheduled on Tuesday, November 14.  Report to [facility] Admitting at 12   Call this number if you have problems the morning of surgery:[phone]                 For any other questions, please call [phone], Monday - Friday 8 AM - 4 PM.   Remember:  Do not eat food or drink liquids after midnight Monday, November 13.  Take these medicines the morning of surgery with A SIP OF WATER: May take LORazepam (ATIVAN) if needed.               1 Week prior to surgery STOP taking Aspirin , Aspirin Products (Goody Powder, Excedrin Migraine), Ibuprofen (Advil), Naproxen (Aleve), Vitamins and Herbal Products (ie Fish Oil).   Do not wear jewelry, make-up or nail polish.  Do not wear lotions, powders, or perfumes, or deodorant.  Do not shave 48 hours prior to surgery.    Do not bring valuables to the hospital.  [facility] is not responsible for any belongings or valuables.  Contacts, dentures or bridgework may not be worn into surgery.  Leave your suitcase in the car.  After surgery it may be brought to your room.  For patients admitted to the hospital, discharge time will be determined by your treatment team.   Please read over the following fact sheets that you were given: Patient Instructions for Mupirocin Application

## 2016-10-31 ENCOUNTER — Other Ambulatory Visit: Payer: BC Managed Care – PPO

## 2016-10-31 ENCOUNTER — Ambulatory Visit: Payer: BC Managed Care – PPO | Admitting: Pharmacist

## 2016-10-31 MED ORDER — CEFAZOLIN SODIUM-DEXTROSE 2-4 GM/100ML-% IV SOLN
2.0000 g | INTRAVENOUS | Status: AC
  Administered 2016-11-01: 2 g via INTRAVENOUS
  Filled 2016-10-31: qty 100

## 2016-10-31 NOTE — H&P (Signed)
TOTAL KNEE ADMISSION H&P  Patient is being admitted for left total knee arthroplasty.  Subjective:  Chief Complaint:left knee pain.  HPI: Thomas Yoder, 55 y.o. male, has a history of pain and functional disability in the left knee due to arthritis and has failed non-surgical conservative treatments for greater than 12 weeks to includeNSAID's and/or analgesics, corticosteriod injections, flexibility and strengthening excercises, use of assistive devices, weight reduction as appropriate and activity modification.  Onset of symptoms was gradual, starting 5 years ago with gradually worsening course since that time. The patient noted prior procedures on the knee to include  arthroscopy on the left knee(s).  Patient currently rates pain in the left knee(s) at 10 out of 10 with activity. Patient has night pain, worsening of pain with activity and weight bearing, pain that interferes with activities of daily living, pain with passive range of motion, crepitus and joint swelling.  Patient has evidence of subchondral cysts, subchondral sclerosis, periarticular osteophytes and joint space narrowing by imaging studies. There is no active infection.  Patient Active Problem List   Diagnosis Date Noted  . Tobacco use disorder 09/18/2014  . Essential hypertension, benign 08/21/2012  . BMI 34.0-34.9,adult 08/21/2012  . Knee osteoarthritis 08/21/2012   Past Medical History:  Diagnosis Date  . Depression   . Hypertension     Past Surgical History:  Procedure Laterality Date  . KNEE SURGERY      No prescriptions prior to admission.   No Known Allergies  Social History  Substance Use Topics  . Smoking status: Current Every Day Smoker    Packs/day: 0.30    Years: 3.00    Types: Cigarettes  . Smokeless tobacco: Not on file  . Alcohol use No    Family History  Problem Relation Age of Onset  . Hypertension Mother   . Hypertension Father      Review of Systems  Musculoskeletal: Positive for joint  pain.       Left knee  All other systems reviewed and are negative.   Objective:  Physical Exam  Constitutional: He is oriented to person, place, and time. He appears well-developed and well-nourished.  HENT:  Head: Normocephalic and atraumatic.  Eyes: Pupils are equal, round, and reactive to light.  Neck: Normal range of motion.  Cardiovascular: Normal rate and regular rhythm.   Respiratory: Effort normal.  GI: Soft.  Musculoskeletal:  Both knees have mild to moderate varus deformities. He has no effusion on either side. There is significant crepitation and medial joint line pain. Hip motion is full on both sides. His range of motion at each knee is about 5-110. There is no palpable lymphadenopathy behind either knee. He has intact sensation and motor function in his feet with palpable pulses on both sides.   Neurological: He is alert and oriented to person, place, and time.  Skin: Skin is warm and dry.  Psychiatric: He has a normal mood and affect. His behavior is normal. Judgment and thought content normal.    Vital signs in last 24 hours:    Labs:   Estimated body mass index is 35.92 kg/m as calculated from the following:   Height as of 10/24/16: 5' 7.5" (1.715 m).   Weight as of 10/24/16: 105.6 kg (232 lb 12.8 oz).   Imaging Review Plain radiographs demonstrate severe degenerative joint disease of the left knee(s). The overall alignment isneutral. The bone quality appears to be good for age and reported activity level.  Assessment/Plan:  End stage primary arthritis,  left knee   The patient history, physical examination, clinical judgment of the provider and imaging studies are consistent with end stage degenerative joint disease of the left knee(s) and total knee arthroplasty is deemed medically necessary. The treatment options including medical management, injection therapy arthroscopy and arthroplasty were discussed at length. The risks and benefits of total knee  arthroplasty were presented and reviewed. The risks due to aseptic loosening, infection, stiffness, patella tracking problems, thromboembolic complications and other imponderables were discussed. The patient acknowledged the explanation, agreed to proceed with the plan and consent was signed. Patient is being admitted for inpatient treatment for surgery, pain control, PT, OT, prophylactic antibiotics, VTE prophylaxis, progressive ambulation and ADL's and discharge planning. The patient is planning to be discharged home with home health services

## 2016-10-31 NOTE — Progress Notes (Deleted)
Patient ID: Thomas Yoder                 DOB: 1961-05-06                      MRN: [id number]     HPI: Thomas Yoder is a 55 y.o. male patient of Dr. Curt Bears who presents to the HTN clinic for follow-up and BMET. PMH of HTN and knee OA. At last clinic visit, it was noted that the Pt's BP had improved from 200/121 to 158/102 on lisinopril 20 mg daily (increased from 10 mg) and HCTZ 12.5 mg daily. He will be undergoing knee surgery soon and hopes to lower his BP enough to be eligible for surgery. He does not check his BP at home.     Current HTN meds: lisinopril 20 mg daily, HCTZ 12.5 mg daily Previously tried: lisinopril 10 mg daily (suboptimal dose) BP goal: <140/90 mmHg  Family History:  HTN (mother and father)   Social History: Patient has cut down on smoking with the intention to quit. He smokes 3-4 cigarettes a week. He used to smoke about 0.3 packs/day for the last 3 years. He states he doesn't have a specific motivation to quit, just desires to on his own with no pharmacological aid. Denies alcohol and illicit drug use.   Diet: Endorses eating a lot cubed steaks, fried chicken, hot dogs, potato chips, but denies adding any extra salt to food.  Exercise: Does not work out due to time constraints.  Wt Readings from Last 3 Encounters:  10/24/16 232 lb 12.8 oz (105.6 kg)  10/18/16 229 lb (103.9 kg)  10/05/16 230 lb (104.3 kg)   BP Readings from Last 3 Encounters:  10/24/16 (!) 178/99  10/21/16 (!) 158/102  10/18/16 (!) 200/121   Pulse Readings from Last 3 Encounters:  10/24/16 82  10/21/16 68  10/18/16 68    Renal function: Estimated Creatinine Clearance: 85.1 mL/min (by C-G formula based on SCr of 1.16 mg/dL).  Past Medical History:  Diagnosis Date  . Depression   . Hypertension     Current Outpatient Prescriptions on File Prior to Visit  Medication Sig Dispense Refill  . Blood Pressure Monitoring (ADULT BLOOD PRESSURE CUFF LG) KIT Check blood pressure daily and  record readings. (Patient not taking: Reported on 10/21/2016) 1 each 0  . escitalopram (LEXAPRO) 10 MG tablet Take 1 tablet (10 mg total) by mouth daily. (Patient not taking: Reported on 10/21/2016) 60 tablet 1  . hydrochlorothiazide (HYDRODIURIL) 12.5 MG tablet Take 2 tablets (25 mg total) by mouth daily. 90 tablet 3  . lisinopril (PRINIVIL,ZESTRIL) 20 MG tablet Take 2 tablets (40 mg total) by mouth daily. 30 tablet 6  . LORazepam (ATIVAN) 0.5 MG tablet Take 1 tablet (0.5 mg total) by mouth daily as needed for anxiety. 30 tablet 0  . [DISCONTINUED] lisinopril-hydrochlorothiazide (PRINZIDE,ZESTORETIC) 10-12.5 MG per tablet Take 1 tablet by mouth daily. 90 tablet 3  . [DISCONTINUED] metoprolol succinate (TOPROL-XL) 50 MG 24 hr tablet Take 1 tablet (50 mg total) by mouth daily. Take with or immediately following a meal. (Patient not taking: Reported on 11/03/2015) 90 tablet 3   No current facility-administered medications on file prior to visit.     No Known Allergies   Assessment/Plan:  1. Hypertension -

## 2016-11-01 ENCOUNTER — Inpatient Hospital Stay (HOSPITAL_COMMUNITY): Payer: BC Managed Care – PPO | Admitting: Certified Registered"

## 2016-11-01 ENCOUNTER — Encounter (HOSPITAL_COMMUNITY): Payer: Self-pay | Admitting: *Deleted

## 2016-11-01 ENCOUNTER — Inpatient Hospital Stay (HOSPITAL_COMMUNITY)
Admission: RE | Admit: 2016-11-01 | Discharge: 2016-11-04 | DRG: 470 | Disposition: A | Discharge status: Home Health | Payer: BC Managed Care – PPO | Source: Ambulatory Visit | Attending: Orthopaedic Surgery | Admitting: Orthopaedic Surgery

## 2016-11-01 ENCOUNTER — Encounter (HOSPITAL_COMMUNITY)
Admission: RE | Disposition: A | Discharge status: Home Health | Payer: Self-pay | Source: Ambulatory Visit | Attending: Orthopaedic Surgery

## 2016-11-01 DIAGNOSIS — Z8249 Family history of ischemic heart disease and other diseases of the circulatory system: Secondary | ICD-10-CM | POA: Diagnosis not present

## 2016-11-01 DIAGNOSIS — F329 Major depressive disorder, single episode, unspecified: Secondary | ICD-10-CM | POA: Diagnosis present

## 2016-11-01 DIAGNOSIS — I10 Essential (primary) hypertension: Secondary | ICD-10-CM | POA: Diagnosis present

## 2016-11-01 DIAGNOSIS — F17210 Nicotine dependence, cigarettes, uncomplicated: Secondary | ICD-10-CM | POA: Diagnosis present

## 2016-11-01 DIAGNOSIS — M1712 Unilateral primary osteoarthritis, left knee: Principal | ICD-10-CM | POA: Diagnosis present

## 2016-11-01 HISTORY — PX: TOTAL KNEE ARTHROPLASTY: SHX125

## 2016-11-01 SURGERY — ARTHROPLASTY, KNEE, TOTAL
Anesthesia: Regional | Site: Knee | Laterality: Left

## 2016-11-01 MED ORDER — ONDANSETRON HCL 4 MG/2ML IJ SOLN
INTRAMUSCULAR | Status: AC
  Filled 2016-11-01: qty 2

## 2016-11-01 MED ORDER — PHENOL 1.4 % MT LIQD
1.0000 | OROMUCOSAL | Status: DC | PRN
  Administered 2016-11-01: 1 via OROMUCOSAL
  Filled 2016-11-01: qty 177

## 2016-11-01 MED ORDER — PROPOFOL 10 MG/ML IV BOLUS
INTRAVENOUS | Status: AC
  Filled 2016-11-01: qty 20

## 2016-11-01 MED ORDER — ONDANSETRON HCL 4 MG/2ML IJ SOLN
4.0000 mg | Freq: Four times a day (QID) | INTRAMUSCULAR | Status: DC | PRN
Start: 2016-11-01 — End: 2016-11-04

## 2016-11-01 MED ORDER — ROCURONIUM BROMIDE 100 MG/10ML IV SOLN
INTRAVENOUS | Status: DC | PRN
  Administered 2016-11-01: 50 mg via INTRAVENOUS

## 2016-11-01 MED ORDER — DIPHENHYDRAMINE HCL 12.5 MG/5ML PO ELIX: 12.5000 mg | ORAL_SOLUTION | ORAL | Status: DC | PRN

## 2016-11-01 MED ORDER — DOCUSATE SODIUM 100 MG PO CAPS
100.0000 mg | ORAL_CAPSULE | Freq: Two times a day (BID) | ORAL | Status: DC
  Administered 2016-11-01 – 2016-11-04 (×6): 100 mg via ORAL
  Filled 2016-11-01 (×6): qty 1

## 2016-11-01 MED ORDER — ONDANSETRON HCL 4 MG/2ML IJ SOLN
INTRAMUSCULAR | Status: DC | PRN
  Administered 2016-11-01: 4 mg via INTRAVENOUS

## 2016-11-01 MED ORDER — LIDOCAINE HCL (CARDIAC) 20 MG/ML IV SOLN
INTRAVENOUS | Status: DC | PRN
  Administered 2016-11-01: 40 mg via INTRAVENOUS

## 2016-11-01 MED ORDER — HYDROMORPHONE HCL 1 MG/ML IJ SOLN
0.2500 mg | INTRAMUSCULAR | Status: DC | PRN
  Administered 2016-11-01 (×2): 0.5 mg via INTRAVENOUS

## 2016-11-01 MED ORDER — HYDROMORPHONE HCL 1 MG/ML IJ SOLN
INTRAMUSCULAR | Status: AC
  Filled 2016-11-01: qty 1.5

## 2016-11-01 MED ORDER — PROPOFOL 10 MG/ML IV BOLUS
INTRAVENOUS | Status: DC | PRN
  Administered 2016-11-01: 200 mg via INTRAVENOUS

## 2016-11-01 MED ORDER — LABETALOL HCL 5 MG/ML IV SOLN
5.0000 mg | Freq: Once | INTRAVENOUS | Status: AC
  Administered 2016-11-01: 5 mg via INTRAVENOUS

## 2016-11-01 MED ORDER — TRANEXAMIC ACID 1000 MG/10ML IV SOLN
1000.0000 mg | INTRAVENOUS | Status: AC
  Administered 2016-11-01: 1000 mg via INTRAVENOUS
  Filled 2016-11-01: qty 10

## 2016-11-01 MED ORDER — METOCLOPRAMIDE HCL 5 MG/ML IJ SOLN: 5.0000 mg | Freq: Three times a day (TID) | INTRAMUSCULAR | Status: DC | PRN

## 2016-11-01 MED ORDER — ASPIRIN EC 325 MG PO TBEC
325.0000 mg | DELAYED_RELEASE_TABLET | Freq: Two times a day (BID) | ORAL | Status: DC
  Administered 2016-11-02 – 2016-11-04 (×5): 325 mg via ORAL
  Filled 2016-11-01 (×5): qty 1

## 2016-11-01 MED ORDER — LISINOPRIL 40 MG PO TABS
40.0000 mg | ORAL_TABLET | Freq: Every day | ORAL | Status: DC
  Administered 2016-11-02 – 2016-11-04 (×3): 40 mg via ORAL
  Filled 2016-11-01 (×5): qty 1

## 2016-11-01 MED ORDER — MIDAZOLAM HCL 2 MG/2ML IJ SOLN
INTRAMUSCULAR | Status: AC
  Filled 2016-11-01: qty 2

## 2016-11-01 MED ORDER — LACTATED RINGERS IV SOLN: INTRAVENOUS | Status: DC

## 2016-11-01 MED ORDER — SODIUM CHLORIDE 0.9 % IR SOLN
Status: DC | PRN
  Administered 2016-11-01: 3000 mL

## 2016-11-01 MED ORDER — HYDROCHLOROTHIAZIDE 25 MG PO TABS
25.0000 mg | ORAL_TABLET | Freq: Every day | ORAL | Status: DC
  Administered 2016-11-02 – 2016-11-04 (×3): 25 mg via ORAL
  Filled 2016-11-01 (×3): qty 1

## 2016-11-01 MED ORDER — BUPIVACAINE LIPOSOME 1.3 % IJ SUSP
INTRAMUSCULAR | Status: DC | PRN
  Administered 2016-11-01: 20 mL

## 2016-11-01 MED ORDER — SUGAMMADEX SODIUM 200 MG/2ML IV SOLN
INTRAVENOUS | Status: AC
  Filled 2016-11-01: qty 2

## 2016-11-01 MED ORDER — CEFAZOLIN SODIUM-DEXTROSE 2-4 GM/100ML-% IV SOLN
2.0000 g | Freq: Four times a day (QID) | INTRAVENOUS | Status: AC
  Administered 2016-11-01 – 2016-11-02 (×2): 2 g via INTRAVENOUS
  Filled 2016-11-01 (×2): qty 100

## 2016-11-01 MED ORDER — METOCLOPRAMIDE HCL 5 MG PO TABS: 5.0000 mg | ORAL_TABLET | Freq: Three times a day (TID) | ORAL | Status: DC | PRN

## 2016-11-01 MED ORDER — METHOCARBAMOL 500 MG PO TABS
ORAL_TABLET | ORAL | Status: AC
  Filled 2016-11-01: qty 1

## 2016-11-01 MED ORDER — LACTATED RINGERS IV SOLN
INTRAVENOUS | Status: DC
  Administered 2016-11-01 (×3): via INTRAVENOUS

## 2016-11-01 MED ORDER — PHENYLEPHRINE 40 MCG/ML (10ML) SYRINGE FOR IV PUSH (FOR BLOOD PRESSURE SUPPORT)
PREFILLED_SYRINGE | INTRAVENOUS | Status: DC | PRN
  Administered 2016-11-01: 80 ug via INTRAVENOUS
  Administered 2016-11-01: 120 ug via INTRAVENOUS

## 2016-11-01 MED ORDER — BUPIVACAINE LIPOSOME 1.3 % IJ SUSP
20.0000 mL | Freq: Once | INTRAMUSCULAR | Status: DC
  Filled 2016-11-01: qty 20

## 2016-11-01 MED ORDER — SODIUM CHLORIDE 0.9 % IJ SOLN
INTRAMUSCULAR | Status: DC | PRN
  Administered 2016-11-01: 40 mL

## 2016-11-01 MED ORDER — FENTANYL CITRATE (PF) 100 MCG/2ML IJ SOLN
INTRAMUSCULAR | Status: AC
  Filled 2016-11-01: qty 4

## 2016-11-01 MED ORDER — FENTANYL CITRATE (PF) 100 MCG/2ML IJ SOLN
100.0000 ug | Freq: Once | INTRAMUSCULAR | Status: AC
  Administered 2016-11-01: 100 ug via INTRAVENOUS

## 2016-11-01 MED ORDER — BISACODYL 5 MG PO TBEC: 5.0000 mg | DELAYED_RELEASE_TABLET | Freq: Every day | ORAL | Status: DC | PRN

## 2016-11-01 MED ORDER — TRANEXAMIC ACID 1000 MG/10ML IV SOLN
1000.0000 mg | Freq: Once | INTRAVENOUS | Status: AC
  Administered 2016-11-01: 1000 mg via INTRAVENOUS
  Filled 2016-11-01: qty 10

## 2016-11-01 MED ORDER — LACTATED RINGERS IV SOLN
INTRAVENOUS | Status: DC
  Administered 2016-11-01: 16:00:00 via INTRAVENOUS

## 2016-11-01 MED ORDER — 0.9 % SODIUM CHLORIDE (POUR BTL) OPTIME
TOPICAL | Status: DC | PRN
  Administered 2016-11-01: 1000 mL

## 2016-11-01 MED ORDER — BUPIVACAINE HCL (PF) 0.25 % IJ SOLN
INTRAMUSCULAR | Status: AC
  Filled 2016-11-01: qty 30

## 2016-11-01 MED ORDER — LORAZEPAM 0.5 MG PO TABS
0.5000 mg | ORAL_TABLET | Freq: Every day | ORAL | Status: DC | PRN
  Administered 2016-11-02 – 2016-11-03 (×2): 0.5 mg via ORAL
  Filled 2016-11-01 (×2): qty 1

## 2016-11-01 MED ORDER — FENTANYL CITRATE (PF) 100 MCG/2ML IJ SOLN
INTRAMUSCULAR | Status: AC
  Filled 2016-11-01: qty 2

## 2016-11-01 MED ORDER — MIDAZOLAM HCL 2 MG/2ML IJ SOLN
2.0000 mg | Freq: Once | INTRAMUSCULAR | Status: AC
  Administered 2016-11-01: 2 mg via INTRAVENOUS

## 2016-11-01 MED ORDER — HYDROMORPHONE HCL 2 MG/ML IJ SOLN
0.5000 mg | INTRAMUSCULAR | Status: DC | PRN
  Administered 2016-11-01: 1 mg via INTRAVENOUS
  Filled 2016-11-01 (×2): qty 1

## 2016-11-01 MED ORDER — BUPIVACAINE HCL (PF) 0.25 % IJ SOLN
INTRAMUSCULAR | Status: DC | PRN
  Administered 2016-11-01: 30 mL

## 2016-11-01 MED ORDER — HYDROCODONE-ACETAMINOPHEN 5-325 MG PO TABS
1.0000 | ORAL_TABLET | ORAL | Status: DC | PRN
  Administered 2016-11-02 (×2): 2 via ORAL
  Filled 2016-11-01 (×2): qty 2

## 2016-11-01 MED ORDER — ALUM & MAG HYDROXIDE-SIMETH 200-200-20 MG/5ML PO SUSP
30.0000 mL | ORAL | Status: DC | PRN
  Administered 2016-11-02 – 2016-11-04 (×3): 30 mL via ORAL
  Filled 2016-11-01 (×3): qty 30

## 2016-11-01 MED ORDER — TRANEXAMIC ACID 1000 MG/10ML IV SOLN
INTRAVENOUS | Status: DC | PRN
  Administered 2016-11-01: 2000 mg via TOPICAL

## 2016-11-01 MED ORDER — PROMETHAZINE HCL 25 MG/ML IJ SOLN: 6.2500 mg | INTRAMUSCULAR | Status: DC | PRN

## 2016-11-01 MED ORDER — ACETAMINOPHEN 650 MG RE SUPP: 650.0000 mg | Freq: Four times a day (QID) | RECTAL | Status: DC | PRN

## 2016-11-01 MED ORDER — TRANEXAMIC ACID 1000 MG/10ML IV SOLN
2000.0000 mg | Freq: Once | INTRAVENOUS | Status: DC
  Filled 2016-11-01: qty 20

## 2016-11-01 MED ORDER — DEXTROSE 5 % IV SOLN
INTRAVENOUS | Status: DC | PRN
  Administered 2016-11-01: 40 ug/min via INTRAVENOUS

## 2016-11-01 MED ORDER — SUGAMMADEX SODIUM 200 MG/2ML IV SOLN
INTRAVENOUS | Status: DC | PRN
  Administered 2016-11-01: 210 mg via INTRAVENOUS

## 2016-11-01 MED ORDER — HYDRALAZINE HCL 20 MG/ML IJ SOLN
INTRAMUSCULAR | Status: DC | PRN
  Administered 2016-11-01: 3 mg via INTRAVENOUS
  Administered 2016-11-01: 5 mg via INTRAVENOUS

## 2016-11-01 MED ORDER — CHLORHEXIDINE GLUCONATE 4 % EX LIQD: 60.0000 mL | Freq: Once | CUTANEOUS | Status: DC

## 2016-11-01 MED ORDER — LABETALOL HCL 5 MG/ML IV SOLN
INTRAVENOUS | Status: AC
  Filled 2016-11-01: qty 4

## 2016-11-01 MED ORDER — MEPERIDINE HCL 25 MG/ML IJ SOLN: 6.2500 mg | INTRAMUSCULAR | Status: DC | PRN

## 2016-11-01 MED ORDER — FENTANYL CITRATE (PF) 100 MCG/2ML IJ SOLN
INTRAMUSCULAR | Status: DC | PRN
  Administered 2016-11-01: 100 ug via INTRAVENOUS
  Administered 2016-11-01 (×4): 50 ug via INTRAVENOUS

## 2016-11-01 MED ORDER — METHOCARBAMOL 500 MG PO TABS
500.0000 mg | ORAL_TABLET | Freq: Four times a day (QID) | ORAL | Status: DC | PRN
  Administered 2016-11-01 – 2016-11-02 (×2): 500 mg via ORAL
  Filled 2016-11-01: qty 1

## 2016-11-01 MED ORDER — MENTHOL 3 MG MT LOZG
1.0000 | LOZENGE | OROMUCOSAL | Status: DC | PRN
  Filled 2016-11-01: qty 9

## 2016-11-01 MED ORDER — ACETAMINOPHEN 325 MG PO TABS
650.0000 mg | ORAL_TABLET | Freq: Four times a day (QID) | ORAL | Status: DC | PRN
  Administered 2016-11-03: 650 mg via ORAL
  Filled 2016-11-01: qty 2

## 2016-11-01 MED ORDER — ONDANSETRON HCL 4 MG PO TABS: 4.0000 mg | ORAL_TABLET | Freq: Four times a day (QID) | ORAL | Status: DC | PRN

## 2016-11-01 MED ORDER — MIDAZOLAM HCL 2 MG/2ML IJ SOLN
INTRAMUSCULAR | Status: AC
  Administered 2016-11-01: 2 mg via INTRAVENOUS
  Filled 2016-11-01: qty 2

## 2016-11-01 MED ORDER — METHOCARBAMOL 1000 MG/10ML IJ SOLN
500.0000 mg | Freq: Four times a day (QID) | INTRAVENOUS | Status: DC | PRN
  Filled 2016-11-01: qty 5

## 2016-11-01 MED ORDER — LACTATED RINGERS IV SOLN
INTRAVENOUS | Status: DC
  Administered 2016-11-01 – 2016-11-02 (×2): via INTRAVENOUS

## 2016-11-01 MED ORDER — FENTANYL CITRATE (PF) 100 MCG/2ML IJ SOLN
INTRAMUSCULAR | Status: AC
  Administered 2016-11-01: 100 ug via INTRAVENOUS
  Filled 2016-11-01: qty 2

## 2016-11-01 SURGICAL SUPPLY — 67 items
BAG DECANTER FOR FLEXI CONT (MISCELLANEOUS) ×3 IMPLANT
BANDAGE ACE 4X5 VEL STRL LF (GAUZE/BANDAGES/DRESSINGS) ×3 IMPLANT
BANDAGE ESMARK 6X9 LF (GAUZE/BANDAGES/DRESSINGS) ×1 IMPLANT
BLADE SAG 18X100X1.27 (BLADE) IMPLANT
BLADE SAGITTAL 25.0X1.19X90 (BLADE) ×2 IMPLANT
BLADE SAGITTAL 25.0X1.19X90MM (BLADE) ×1
BLADE SAW SGTL 13.0X1.19X90.0M (BLADE) IMPLANT
BLADE SURG ROTATE 9660 (MISCELLANEOUS) IMPLANT
BNDG ELASTIC 6X10 VLCR STRL LF (GAUZE/BANDAGES/DRESSINGS) ×3 IMPLANT
BNDG ESMARK 6X9 LF (GAUZE/BANDAGES/DRESSINGS) ×3
BNDG GAUZE ELAST 4 BULKY (GAUZE/BANDAGES/DRESSINGS) ×6 IMPLANT
BOWL SMART MIX CTS (DISPOSABLE) ×3 IMPLANT
CAP KNEE TOTAL 3 SIGMA ×3 IMPLANT
CEMENT HV SMART SET (Cement) ×6 IMPLANT
CLOSURE WOUND 1/2 X4 (GAUZE/BANDAGES/DRESSINGS) ×1
COVER SURGICAL LIGHT HANDLE (MISCELLANEOUS) ×3 IMPLANT
CUFF TOURNIQUET SINGLE 34IN LL (TOURNIQUET CUFF) ×3 IMPLANT
CUFF TOURNIQUET SINGLE 44IN (TOURNIQUET CUFF) IMPLANT
DECANTER SPIKE VIAL GLASS SM (MISCELLANEOUS) IMPLANT
DRAPE EXTREMITY T 121X128X90 (DRAPE) ×3 IMPLANT
DRAPE HALF SHEET 40X57 (DRAPES) ×3 IMPLANT
DRAPE PROXIMA HALF (DRAPES) ×3 IMPLANT
DRAPE U-SHAPE 47X51 STRL (DRAPES) ×3 IMPLANT
DRSG ADAPTIC 3X8 NADH LF (GAUZE/BANDAGES/DRESSINGS) ×3 IMPLANT
DRSG PAD ABDOMINAL 8X10 ST (GAUZE/BANDAGES/DRESSINGS) ×6 IMPLANT
DURAPREP 26ML APPLICATOR (WOUND CARE) ×3 IMPLANT
ELECT CAUTERY BLADE 6.4 (BLADE) ×3 IMPLANT
ELECT REM PT RETURN 9FT ADLT (ELECTROSURGICAL) ×3
ELECTRODE REM PT RTRN 9FT ADLT (ELECTROSURGICAL) ×1 IMPLANT
GAUZE SPONGE 4X4 12PLY STRL (GAUZE/BANDAGES/DRESSINGS) ×3 IMPLANT
GLOVE BIO SURGEON STRL SZ8 (GLOVE) ×6 IMPLANT
GLOVE BIOGEL PI IND STRL 8 (GLOVE) ×2 IMPLANT
GLOVE BIOGEL PI INDICATOR 8 (GLOVE) ×4
GOWN STRL REUS W/ TWL LRG LVL3 (GOWN DISPOSABLE) ×1 IMPLANT
GOWN STRL REUS W/ TWL XL LVL3 (GOWN DISPOSABLE) ×2 IMPLANT
GOWN STRL REUS W/TWL LRG LVL3 (GOWN DISPOSABLE) ×2
GOWN STRL REUS W/TWL XL LVL3 (GOWN DISPOSABLE) ×4
HANDPIECE INTERPULSE COAX TIP (DISPOSABLE) ×2
HOOD PEEL AWAY FACE SHEILD DIS (HOOD) ×6 IMPLANT
IMMOBILIZER KNEE 22 UNIV (SOFTGOODS) ×3 IMPLANT
KIT BASIN OR (CUSTOM PROCEDURE TRAY) ×3 IMPLANT
KIT ROOM TURNOVER OR (KITS) ×3 IMPLANT
MANIFOLD NEPTUNE II (INSTRUMENTS) ×3 IMPLANT
NEEDLE 22X1 1/2 (OR ONLY) (NEEDLE) ×3 IMPLANT
NEEDLE HYPO 21X1 ECLIPSE (NEEDLE) ×3 IMPLANT
NS IRRIG 1000ML POUR BTL (IV SOLUTION) ×3 IMPLANT
PACK TOTAL JOINT (CUSTOM PROCEDURE TRAY) ×3 IMPLANT
PACK UNIVERSAL I (CUSTOM PROCEDURE TRAY) IMPLANT
PAD ARMBOARD 7.5X6 YLW CONV (MISCELLANEOUS) ×6 IMPLANT
SET HNDPC FAN SPRY TIP SCT (DISPOSABLE) ×1 IMPLANT
STAPLER VISISTAT 35W (STAPLE) IMPLANT
STRIP CLOSURE SKIN 1/2X4 (GAUZE/BANDAGES/DRESSINGS) ×2 IMPLANT
SUCTION FRAZIER HANDLE 10FR (MISCELLANEOUS) ×2
SUCTION TUBE FRAZIER 10FR DISP (MISCELLANEOUS) ×1 IMPLANT
SUT MNCRL AB 3-0 PS2 18 (SUTURE) ×3 IMPLANT
SUT VIC AB 0 CT1 27 (SUTURE) ×4
SUT VIC AB 0 CT1 27XBRD ANBCTR (SUTURE) ×2 IMPLANT
SUT VIC AB 2-0 CT1 27 (SUTURE) ×4
SUT VIC AB 2-0 CT1 TAPERPNT 27 (SUTURE) ×2 IMPLANT
SUT VLOC 180 0 24IN GS25 (SUTURE) ×3 IMPLANT
SYR 50ML LL SCALE MARK (SYRINGE) ×3 IMPLANT
TOWEL OR 17X24 6PK STRL BLUE (TOWEL DISPOSABLE) ×3 IMPLANT
TOWEL OR 17X26 10 PK STRL BLUE (TOWEL DISPOSABLE) ×3 IMPLANT
TRAY CATH 16FR W/PLASTIC CATH (SET/KITS/TRAYS/PACK) IMPLANT
TRAY FOLEY CATH 14FR (SET/KITS/TRAYS/PACK) IMPLANT
UPCHARGE REV TRAY MBT KNEE ×3 IMPLANT
WATER STERILE IRR 1000ML POUR (IV SOLUTION) IMPLANT

## 2016-11-01 NOTE — Interval H&P Note (Signed)
History and Physical Interval Note:  11/01/2016 10:43 AM  Thomas Yoder  has presented today for surgery, with the diagnosis of LEFT KNEE DEGENERATIVE JOINT DISEASE  The various methods of treatment have been discussed with the patient and family. After consideration of risks, benefits and other options for treatment, the patient has consented to  Procedure(s): TOTAL KNEE ARTHROPLASTY (Left) as a surgical intervention .  The patient's history has been reviewed, patient examined, no change in status, stable for surgery.  I have reviewed the patient's chart and labs.  Questions were answered to the patient's satisfaction.     Concepcion Gillott G

## 2016-11-01 NOTE — Transfer of Care (Signed)
Immediate Anesthesia Transfer of Care Note  Patient: Thomas Yoder  Procedure(s) Performed: Procedure(s): TOTAL KNEE ARTHROPLASTY (Left)  Patient Location: PACU  Anesthesia Type:General and GA combined with regional for post-op pain  Level of Consciousness: awake, alert  and patient cooperative  Airway & Oxygen Therapy: Patient Spontanous Breathing  Post-op Assessment: Report given to RN, Post -op Vital signs reviewed and stable and Patient moving all extremities X 4  Post vital signs: Reviewed and stable  Last Vitals:  Vitals:   11/01/16 1230 11/01/16 1310  BP: (!) 171/83 (!) 153/99  Pulse: 67 74  Resp: 19 (!) 26  Temp:      Last Pain:  Vitals:   11/01/16 1040  TempSrc:   PainSc: 7       Patients Stated Pain Goal: 3 (11/01/16 1040)  Complications: No apparent anesthesia complications

## 2016-11-01 NOTE — Progress Notes (Signed)
Pressure is elevated this morning before surgery.  Dr Jerl Santos made aware.  Dr Noreene Larsson made aware.  Dr. Noreene Larsson will come see patient and decide on how to treat pressure.

## 2016-11-01 NOTE — Progress Notes (Signed)
Pt came to floor alert and orientated x4, clear liquid orders, sister at bedside

## 2016-11-01 NOTE — Anesthesia Procedure Notes (Addendum)
Procedure Name: Intubation Date/Time: 11/01/2016 1:52 PM Performed by: Rogelia Boga Pre-anesthesia Checklist: Patient identified, Emergency Drugs available, Suction available, Patient being monitored and Timeout performed Patient Re-evaluated:Patient Re-evaluated prior to inductionOxygen Delivery Method: Circle system utilized Preoxygenation: Pre-oxygenation with 100% oxygen Intubation Type: IV induction Ventilation: Mask ventilation without difficulty and Oral airway inserted - appropriate to patient size Laryngoscope Size: Mac and 4 Grade View: Grade II Tube type: Oral Tube size: 7.5 mm Number of attempts: 1 Airway Equipment and Method: Stylet Placement Confirmation: ETT inserted through vocal cords under direct vision,  positive ETCO2 and breath sounds checked- equal and bilateral Secured at: 23 cm Tube secured with: Tape Dental Injury: Teeth and Oropharynx as per pre-operative assessment

## 2016-11-01 NOTE — Op Note (Signed)
PREOP DIAGNOSIS: DJD LEFT KNEE POSTOP DIAGNOSIS:  same PROCEDURE: LEFT TKR ANESTHESIA: General and block ATTENDING SURGEON: Rosalina Dingwall G ASSISTANElodia Florence: Andrew Nida PA  INDICATIONS FOR PROCEDURE: Thomas Yoder is a 55 y.o. male who has struggled for a long time with pain due to degenerative arthritis of the left knee.  The patient has failed many conservative non-operative measures and at this point has pain which limits the ability to sleep and walk.  The patient is offered total knee replacement.  Informed operative consent was obtained after discussion of possible risks of anesthesia, infection, neurovascular injury, DVT, and death.  The importance of the post-operative rehabilitation protocol to optimize result was stressed extensively with the patient.  SUMMARY OF FINDINGS AND PROCEDURE:  Thomas Yoder was taken to the operative suite where under the above anesthesia a left knee replacement was performed.  There were advanced degenerative changes and the bone quality was excellent.  We used the DePuyLCS system and placed size standard plus femur, 4 MBT revision tibia, 38 mm all polyethylene patella, and a size 10 mm spacer.  Elodia FlorenceAndrew Nida PA-C assisted throughout and was invaluable to the completion of the case in that he helped retract and maintain exposure while I placed the components.  He also helped close thereby minimizing OR time.  The patient was admitted for appropriate post-op care to include perioperative antibiotics and mechanical and pharmacologic measures for DVT prophylaxis.  DESCRIPTION OF PROCEDURE:  Thomas Yoder was taken to the operative suite where the above anesthesia was applied.  The patient was positioned supine and prepped and draped in normal sterile fashion.  An appropriate time out was performed.  After the administration of kefzol pre-op antibiotic the leg was elevated and exsanguinated and a tourniquet inflated.  A standard longitudinal incision was made on the anterior  knee.  Dissection was carried down to the extensor mechanism.  All appropriate anti-infective measures were used including the pre-operative antibiotic, betadine impregnated drape, and closed hooded exhaust systems for each member of the surgical team.  A medial parapatellar incision was made in the extensor mechanism and the knee cap flipped and the knee flexed.  Some residual meniscal tissues were removed along with any remaining ACL/PCL tissue.  A guide was placed on the tibia and a flat cut was made on it's superior surface.  An intramedullary guide was placed in the femur and was utilized to make anterior and posterior cuts creating an appropriate flexion gap.  A second intramedullary guide was placed in the femur to make a distal cut properly balancing the knee with an extension gap equal to the flexion gap.  The three bones sized to the above mentioned sizes and the appropriate guides were placed and utilized.  A trial reduction was done and the knee easily came to full extension and the patella tracked well on flexion.  The trial components were removed and all bones were cleaned with pulsatile lavage and then dried thoroughly.  Cement was mixed and was pressurized onto the bones followed by placement of the aforementioned components.  Excess cement was trimmed and pressure was held on the components until the cement had hardened.  The tourniquet was deflated and a small amount of bleeding was controlled with cautery and pressure.  The knee was irrigated thoroughly.  The extensor mechanism was re-approximated with V-loc suture in running fashion.  The knee was flexed and the repair was solid.  The subcutaneous tissues were re-approximated with #0 and #2-0 vicryl and the skin closed  with a subcuticular stitch and steristrips.  A sterile dressing was applied.  Intraoperative fluids, EBL, and tourniquet time can be obtained from anesthesia records.  DISPOSITION:  The patient was taken to recovery room in  stable condition and admitted for appropriate post-op care to include peri-operative antibiotic and DVT prophylaxis with mechanical and pharmacologic measures.  Leonid Manus G 11/01/2016, 3:15 PM

## 2016-11-01 NOTE — Progress Notes (Signed)
Orthopedic Tech Progress Note Patient Details:  Thomas Yoder 1961-02-15 [id number]  CPM Left Knee CPM Left Knee: On Left Knee Flexion (Degrees): 90 Left Knee Extension (Degrees): 0  Ortho Devices Ortho Device/Splint Location: applied ohf to bed Ortho Device/Splint Interventions: Ordered, Application, Adjustment   Jennye Moccasin 11/01/2016, 4:56 PM

## 2016-11-01 NOTE — Anesthesia Postprocedure Evaluation (Signed)
Anesthesia Post Note  Patient: Reino Wackerman  Procedure(s) Performed: Procedure(s) (LRB): TOTAL KNEE ARTHROPLASTY (Left)  Patient location during evaluation: PACU Anesthesia Type: General and Regional Level of consciousness: awake Pain management: pain level controlled Vital Signs Assessment: post-procedure vital signs reviewed and stable Respiratory status: spontaneous breathing Cardiovascular status: stable Postop Assessment: no signs of nausea or vomiting Anesthetic complications: no    Last Vitals:  Vitals:   11/01/16 1715 11/01/16 1746  BP:  (!) 147/96  Pulse: 87 83  Resp: 18 16  Temp: 36.7 C 37.1 C    Last Pain:  Vitals:   11/01/16 1746  TempSrc: Oral  PainSc:                  Devery Murgia

## 2016-11-01 NOTE — Anesthesia Procedure Notes (Signed)
Anesthesia Regional Block:  Adductor canal block  Pre-Anesthetic Checklist: ,, timeout performed, Correct Patient, Correct Site, Correct Laterality, Correct Procedure, Correct Position, site marked, Risks and benefits discussed,  Surgical consent,  Pre-op evaluation,  At surgeon's request and post-op pain management  Laterality: Left  Prep: chloraprep       Needles:   Needle Type: Stimulator Needle - 80          Additional Needles:  Procedures: ultrasound guided (picture in chart) Adductor canal block Narrative:  Start time: 11/01/2016 11:20 AM End time: 11/01/2016 11:25 AM Injection made incrementally with aspirations every 5 mL.  Performed by: Personally  Anesthesiologist: Mariapaula Krist  Additional Notes: 20 cc 0.75% Ropivacaine injected easily

## 2016-11-01 NOTE — Anesthesia Preprocedure Evaluation (Addendum)
Anesthesia Evaluation  Patient identified by MRN, date of birth, ID band Patient awake    Reviewed: Allergy & Precautions, NPO status , Patient's Chart, lab work & pertinent test results  Airway Mallampati: II  TM Distance: >3 FB Neck ROM: Full    Dental  (+) Teeth Intact, Dental Advisory Given   Pulmonary Current Smoker,    breath sounds clear to auscultation       Cardiovascular hypertension, Pt. on medications  Rhythm:Regular Rate:Normal     Neuro/Psych Depression    GI/Hepatic   Endo/Other    Renal/GU      Musculoskeletal  (+) Arthritis , Osteoarthritis,    Abdominal   Peds  Hematology   Anesthesia Other Findings   Reproductive/Obstetrics                           Anesthesia Physical Anesthesia Plan  ASA: III  Anesthesia Plan: General and Regional   Post-op Pain Management:    Induction: Intravenous  Airway Management Planned: Oral ETT  Additional Equipment:   Intra-op Plan:   Post-operative Plan: Extubation in OR  Informed Consent: I have reviewed the patients History and Physical, chart, labs and discussed the procedure including the risks, benefits and alternatives for the proposed anesthesia with the patient or authorized representative who has indicated his/her understanding and acceptance.   Dental advisory given  Plan Discussed with: CRNA, Anesthesiologist and Surgeon  Anesthesia Plan Comments:        Anesthesia Quick Evaluation

## 2016-11-02 ENCOUNTER — Encounter (HOSPITAL_COMMUNITY): Payer: Self-pay | Admitting: Orthopaedic Surgery

## 2016-11-02 MED ORDER — HYDROCODONE-ACETAMINOPHEN 7.5-325 MG PO TABS
1.0000 | ORAL_TABLET | ORAL | Status: DC | PRN
  Administered 2016-11-02: 1 via ORAL
  Administered 2016-11-02: 2 via ORAL
  Administered 2016-11-03 (×3): 1 via ORAL
  Administered 2016-11-03 – 2016-11-04 (×3): 2 via ORAL
  Filled 2016-11-02: qty 2
  Filled 2016-11-02 (×3): qty 1
  Filled 2016-11-02 (×2): qty 2
  Filled 2016-11-02: qty 1
  Filled 2016-11-02: qty 2

## 2016-11-02 MED ORDER — METHOCARBAMOL 750 MG PO TABS
750.0000 mg | ORAL_TABLET | Freq: Four times a day (QID) | ORAL | Status: DC | PRN
  Administered 2016-11-02 – 2016-11-03 (×5): 750 mg via ORAL
  Filled 2016-11-02 (×5): qty 1

## 2016-11-02 NOTE — Care Management Note (Signed)
Case Management Note  Patient Details  Name: Thomas Yoder MRN: [id number] Date of Birth: 1961/10/18  Subjective/Objective:  55 yr old gentleman s/p left total knee arthroplasty.                Action/Plan: Case manager spoke with patient concerning discharge plans.Patient was preoperatively setup with [facility].  Patient states he has planned to go to rehab facility because he has no one to assist with his care at discharge. Patient states he lives alone. CM notified Child psychotherapist. CM will continue to monitor.   Expected Discharge Date:                  Expected Discharge Plan:   to be determined  In-House Referral:     Discharge planning Services     Post Acute Care Choice:    Choice offered to:     DME Arranged:    DME Agency:     HH Arranged:   PT HH Agency:   [facility]  Status of Service:     If discussed at Long Length of Stay Meetings, dates discussed:    Additional Comments:  Durenda Guthrie, RN 11/02/2016, 2:18 PM

## 2016-11-02 NOTE — Progress Notes (Addendum)
Subjective: 1 Day Post-Op Procedure(s) (LRB): TOTAL KNEE ARTHROPLASTY (Left)  Activity level:  wbat Diet tolerance:  ok Voiding:  ok Patient reports pain as moderate.    Objective: Vital signs in last 24 hours: Temp:  [97.9 F (36.6 C)-98.9 F (37.2 C)] 98.7 F (37.1 C) (11/15 0548) Pulse Rate:  [61-97] 88 (11/15 0746) Resp:  [13-26] 18 (11/15 0548) BP: (136-205)/(80-125) 172/98 (11/15 0746) SpO2:  [90 %-100 %] 99 % (11/15 0746) Weight:  [105.2 kg (232 lb)] 105.2 kg (232 lb) (11/14 1022)  Labs: No results for input(s): HGB in the last 72 hours. No results for input(s): WBC, RBC, HCT, PLT in the last 72 hours. No results for input(s): NA, K, CL, CO2, BUN, CREATININE, GLUCOSE, CALCIUM in the last 72 hours. No results for input(s): LABPT, INR in the last 72 hours.  Physical Exam:  Neurologically intact ABD soft Neurovascular intact Sensation intact distally Intact pulses distally Dorsiflexion/Plantar flexion intact Incision: dressing C/D/I and no drainage No cellulitis present Compartment soft  Assessment/Plan:  1 Day Post-Op Procedure(s) (LRB): TOTAL KNEE ARTHROPLASTY (Left) Advance diet Up with therapy Plan for discharge tomorrow Discharge home with home health if doing well and cleared by PT. If not and no help available at home then may need SNF placement. Continue on ASA 325mg  BID x 2 weeks post op.  Follow up in office 2 weeks post op. I will change dressing to aquacel tomorrow. I increased pain meds and muscle relaxer to help with pain.  Thomas Yoder, Ginger Organ 11/02/2016, 7:52 AM

## 2016-11-02 NOTE — Evaluation (Addendum)
Occupational Therapy Evaluation Patient Details Name: Thomas Yoder MRN: [id number] DOB: 1961/03/11 Today's Date: 11/02/2016    History of Present Illness   55 yr old gentleman s/p left total knee arthroplasty. Pt  has a past medical history of Depression and Hypertension.   Clinical Impression   PTA Pt independent in ADL and mobility. Pt currently mod assist for LB ADL and min guard for mobility with RW. Pt with deficits listed below. Will benefit from therapy in SNF to maximize independent in ADL as he lives alone and will require assistance. All OT needs can be met at next venue of care. Thank you for this referral.     Follow Up Recommendations  SNF;Supervision/Assistance - 24 hour    Equipment Recommendations  Other (comment) (defer to next venue of care)    Recommendations for Other Services       Precautions / Restrictions Precautions Precautions: Knee Precaution Booklet Issued: No Precaution Comments: No pillow under knee Restrictions Weight Bearing Restrictions: Yes RLE Weight Bearing: Weight bearing as tolerated  Left Knee Immobilizer: On when out of bed or walking     Mobility Bed Mobility Overal bed mobility: Needs Assistance Bed Mobility: Sit to Supine       Sit to supine: Mod assist   General bed mobility comments: mod assist with LLE into bed, Pt able to manage RLE, and trunk. OT provided verbal cues for sequencing.  Transfers Overall transfer level: Needs assistance Equipment used: Rolling walker (2 wheeled) Transfers: Sit to/from Stand Sit to Stand: Min guard         General transfer comment: verbal cues for safe hand placement    Balance Overall balance assessment: Needs assistance Sitting-balance support: No upper extremity supported;Feet supported Sitting balance-Leahy Scale: Good Sitting balance - Comments: sitting EOB with no back support   Standing balance support: Bilateral upper extremity supported;During functional  activity Standing balance-Leahy Scale: Poor Standing balance comment: Pt used BUE support on forearms during sink level ADL                            ADL Overall ADL's : Needs assistance/impaired     Grooming: Wash/dry hands;Wash/dry face;Min guard;Standing Grooming Details (indicate cue type and reason): sink level, min guard for safety Upper Body Bathing: Set up;Sitting   Lower Body Bathing: Moderate assistance;Sitting/lateral leans   Upper Body Dressing : Set up;Sitting   Lower Body Dressing: Moderate assistance;Sit to/from stand   Toilet Transfer: Ambulation;Comfort height toilet;RW;Grab bars;Minimal assistance Toilet Transfer Details (indicate cue type and reason): heavy use of RW and grab bars, and assist from OT to lift LLE during sitting for pain management Toileting- Clothing Manipulation and Hygiene: Supervision/safety;Sit to/from stand Toileting - Clothing Manipulation Details (indicate cue type and reason): able to manage hospital gown and peri care     Functional mobility during ADLs: Min guard;Rolling walker General ADL Comments: Pt motivated and willing to work with therapy. 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     Vision Vision Assessment?: No apparent visual deficits   Perception     Praxis      Pertinent Vitals/Pain Pain Assessment: 0-10 Pain Score: 8  Pain Location: L knee Pain Descriptors / Indicators: Grimacing;Guarding;Moaning;Sore Pain Intervention(s): Limited activity within patient's tolerance;Monitored during session;Repositioned;Ice applied     Hand Dominance Right   Extremity/Trunk Assessment Upper Extremity Assessment Upper Extremity Assessment: Overall WFL for tasks assessed   Lower Extremity Assessment Lower Extremity Assessment:  LLE deficits/detail LLE Deficits / Details: decreased ROM and strength post-op LLE: Unable to fully assess due to immobilization;Unable to fully assess due to pain   Cervical / Trunk  Assessment Cervical / Trunk Assessment: Normal   Communication Communication Communication: HOH   Cognition Arousal/Alertness: Awake/alert Behavior During Therapy: WFL for tasks assessed/performed Overall Cognitive Status: Within Functional Limits for tasks assessed                     General Comments       Exercises       Shoulder Instructions      Home Living Family/patient expects to be discharged to:: Skilled nursing facility Living Arrangements: Alone   Type of Home: House Home Access: Stairs to enter Entrance Stairs-Number of Steps: 1 Entrance Stairs-Rails: None Home Layout: One level     Bathroom Shower/Tub: Tub/shower unit Shower/tub characteristics: Architectural technologist: Standard Bathroom Accessibility: Yes How Accessible: Accessible via walker Home Equipment: None          Prior Functioning/Environment Level of Independence: Independent        Comments: works as a Sports coach, Wellsite geologist order        OT Problem List: Decreased strength;Decreased range of motion;Decreased activity tolerance;Impaired balance (sitting and/or standing);Decreased safety awareness;Decreased knowledge of use of DME or AE;Decreased knowledge of precautions;Pain   OT Treatment/Interventions:      OT Goals(Current goals can be found in the care plan section) Acute Rehab OT Goals Patient Stated Goal: To get back to work OT Goal Formulation: With patient Time For Goal Achievement: 11/09/16 Potential to Achieve Goals: Good  OT Frequency:     Barriers to D/C:            Co-evaluation              End of Session Equipment Utilized During Treatment: Rolling walker CPM Left Knee CPM Left Knee: Off Nurse Communication: Mobility status  Activity Tolerance: Patient tolerated treatment well Patient left: in bed;with call bell/phone within reach   Time: 4643-1427 OT Time Calculation (min): 29 min Charges:  OT General Charges $OT Visit: 1 Procedure OT  Evaluation $OT Eval Moderate Complexity: 1 Procedure OT Treatments $Self Care/Home Management : 8-22 mins G-Codes:    Merri Ray Juandedios Dudash Nov 22, 2016, 4:46 PM  Hulda Humphrey OTR/L [phone]   Addendum: LKI used during session (brace)

## 2016-11-02 NOTE — Progress Notes (Signed)
Orthopedic Tech Progress Note Patient Details:  Thomas Yoder 1961/10/18 [id number]  Patient ID: Thomas Yoder, male   DOB: 01/09/1961, 55 y.o.   MRN: [id number] Applied cpm 10-40. Cpm was already set to 10, pt couldn't handle more than 40.  [name] 11/02/2016, 6:32 AM

## 2016-11-02 NOTE — Progress Notes (Signed)
11/02/16 1741  PT Visit Information  Last PT Received On 11/02/16  Assistance Needed +1  History of Present Illness 55 yr old gentleman s/p left total knee arthroplasty on 11/01/16. Pt  has a past medical history of Depression and Hypertension.  Subjective Data  Subjective Pt reports the pain medicine is making him feel dizzy, so he only took one his last dose.   Patient Stated Goal To get back to work  Precautions  Precautions Knee  Precaution Booklet Issued Yes (comment)  Precaution Comments knee exercise handout given and reviewed  Required Braces or Orthoses Knee Immobilizer - Left  Knee Immobilizer - Left On when out of bed or walking  Restrictions  RLE Weight Bearing WBAT  Pain Assessment  Pain Assessment 0-10  Pain Score 8  Pain Location left knee  Pain Descriptors / Indicators Aching;Burning;Grimacing;Guarding  Pain Intervention(s) Limited activity within patient's tolerance;Monitored during session;Repositioned;Ice applied  Cognition  Arousal/Alertness Awake/alert  Behavior During Therapy WFL for tasks assessed/performed  Overall Cognitive Status Within Functional Limits for tasks assessed  Bed Mobility  Overal bed mobility Needs Assistance  Bed Mobility Supine to Sit  Supine to sit Min assist;HOB elevated  Sit to supine Min assist  General bed mobility comments Min assist to help progress left leg over EOB.  Attempted to have pt use both hands to move leg, but he was unable to do so.  Pt using bed rail and trapeeze bar to control his trunk.   Transfers  Overall transfer level Needs assistance  Equipment used Rolling walker (2 wheeled)  Transfers Sit to/from Stand  Sit to Stand Min assist;From elevated surface  General transfer comment Min assist to support trunk and stabilize RW from elevated bed.  Pt needs verbal cues for safe hand placement. Wide BOS when coming up to stand, so one (good leg) leg inside of RW and one outside.   Ambulation/Gait  Ambulation/Gait  assistance Min assist  Ambulation Distance (Feet) 85 Feet  Assistive device Rolling walker (2 wheeled)  Gait Pattern/deviations Step-to pattern;Antalgic  General Gait Details Pt reports upper extremity fatigue.  Significant extra time needed to walk with RW.  Min assist for balance.    Gait velocity decreased  Gait velocity interpretation Below normal speed for age/gender  Balance  Overall balance assessment Needs assistance  Sitting-balance support Feet supported;No upper extremity supported  Sitting balance-Leahy Scale Good  Standing balance support Bilateral upper extremity supported  Standing balance-Leahy Scale Poor  Exercises  Exercises Total Joint  Total Joint Exercises  Short Arc Quad AAROM;Left;10 reps  Hip ABduction/ADduction AAROM;Left;10 reps  Straight Leg Raises AAROM;Left;10 reps  PT - End of Session  Equipment Utilized During Treatment Gait belt;Left knee immobilizer  Activity Tolerance Patient limited by pain;Patient limited by fatigue  Patient left in bed;with call bell/phone within reach;in CPM  PT - Assessment/Plan  PT Plan Current plan remains appropriate  PT Frequency (ACUTE ONLY) 7X/week  Follow Up Recommendations SNF  PT equipment Rolling walker with 5" wheels;3in1 (PT)  PT Goal Progression  Progress towards PT goals Progressing toward goals  PT Time Calculation  PT Start Time (ACUTE ONLY) 1710  PT Stop Time (ACUTE ONLY) 1736  PT Time Calculation (min) (ACUTE ONLY) 26 min  PT General Charges  $$ ACUTE PT VISIT 1 Procedure  PT Treatments  $Gait Training 8-22 mins  $Therapeutic Exercise 8-22 mins  A: Pt is POD #1 and this is his second session.  He was able to progress his gait further  down the hallway, but still requires min assist overall.  Pt continues to be appropriate for SNF level rehab at discharge.  PT will continue to follow acutely.  Rollene Rotunda Jameel Quant, PT, DPT [phone]

## 2016-11-02 NOTE — [place] FL2 (Signed)
[place] MEDICAID FL2 LEVEL OF CARE SCREENING TOOL     IDENTIFICATION  Patient Name: Thomas Yoder Birthdate: 22-Sep-1961 Sex: male Admission Date (Current Location): 11/01/2016  [facility] [place]   Facility and Address:  [facility] [place], [address], [place], [place] [address]      Provider Number: [id number]  Attending Physician Name and Address:  Marcene CorningPeter Dalldorf, MD  Relative Name and Phone Number:       Current Level of Care: Hospital Recommended Level of Care: Skilled Nursing Facility Prior Approval Number:    Date Approved/Denied:   PASRR Number: [id number]  Discharge Plan: SNF    Current Diagnoses: Patient Active Problem List   Diagnosis Date Noted  . Primary osteoarthritis of left knee 11/01/2016  . Tobacco use disorder 09/18/2014  . Essential hypertension, benign 08/21/2012  . BMI 34.0-34.9,adult 08/21/2012  . Knee osteoarthritis 08/21/2012    Orientation RESPIRATION BLADDER Height & Weight     Self, Time, Situation, Place  Normal Continent Weight: 232 lb (105.2 kg) Height:     BEHAVIORAL SYMPTOMS/MOOD NEUROLOGICAL BOWEL NUTRITION STATUS      Continent Diet  AMBULATORY STATUS COMMUNICATION OF NEEDS Skin   Limited Assist Verbally Surgical wounds                       Personal Care Assistance Level of Assistance  Bathing, Dressing Bathing Assistance: Limited assistance   Dressing Assistance: Limited assistance     Functional Limitations Info             SPECIAL CARE FACTORS FREQUENCY  PT (By licensed PT), OT (By licensed OT)     PT Frequency: daily OT Frequency: daily            Contractures Contractures Info: Not present    Additional Factors Info                  Current Medications (11/02/2016):  This is the current hospital active medication list Current Facility-Administered Medications  Medication Dose Route Frequency Provider Last Rate Last Dose  . acetaminophen  (TYLENOL) tablet 650 mg  650 mg Oral Q6H PRN Elodia FlorenceAndrew Nida, PA-C       Or  . acetaminophen (TYLENOL) suppository 650 mg  650 mg Rectal Q6H PRN Elodia FlorenceAndrew Nida, PA-C      . alum & mag hydroxide-simeth (MAALOX/MYLANTA) 200-200-20 MG/5ML suspension 30 mL  30 mL Oral Q4H PRN Elodia FlorenceAndrew Nida, PA-C   30 mL at 11/02/16 0058  . aspirin EC tablet 325 mg  325 mg Oral BID PC Elodia Florencendrew Nida, PA-C   325 mg at 11/02/16 [id number]  . bisacodyl (DULCOLAX) EC tablet 5 mg  5 mg Oral Daily PRN Elodia FlorenceAndrew Nida, PA-C      . diphenhydrAMINE (BENADRYL) 12.5 MG/5ML elixir 12.5-25 mg  12.5-25 mg Oral Q4H PRN Elodia FlorenceAndrew Nida, PA-C      . docusate sodium (COLACE) capsule 100 mg  100 mg Oral BID Elodia FlorenceAndrew Nida, PA-C   100 mg at 11/02/16 [id number]  . hydrochlorothiazide (HYDRODIURIL) tablet 25 mg  25 mg Oral Daily Elodia FlorenceAndrew Nida, PA-C   25 mg at 11/02/16 [id number]  . HYDROcodone-acetaminophen (NORCO) 7.5-325 MG per tablet 1-2 tablet  1-2 tablet Oral Q4H PRN Elodia FlorenceAndrew Nida, PA-C   2 tablet at 11/02/16 1034  . HYDROmorphone (DILAUDID) injection 0.5-1 mg  0.5-1 mg Intravenous Q3H PRN Elodia FlorenceAndrew Nida, PA-C   1 mg at 11/01/16 1849  . lactated ringers infusion  Intravenous Continuous Elodia Florence, PA-C 50 mL/hr at 11/02/16 8366    . lisinopril (PRINIVIL,ZESTRIL) tablet 40 mg  40 mg Oral Daily Elodia Florence, PA-C   40 mg at 11/02/16 0805  . LORazepam (ATIVAN) tablet 0.5 mg  0.5 mg Oral Daily PRN Elodia Florence, PA-C   0.5 mg at 11/02/16 0355  . menthol-cetylpyridinium (CEPACOL) lozenge 3 mg  1 lozenge Oral PRN Elodia Florence, PA-C       Or  . phenol (CHLORASEPTIC) mouth spray 1 spray  1 spray Mouth/Throat PRN Elodia Florence, PA-C   1 spray at 11/01/16 2153  . methocarbamol (ROBAXIN) tablet 750 mg  750 mg Oral Q6H PRN Elodia Florence, PA-C   750 mg at 11/02/16 1034  . metoCLOPramide (REGLAN) tablet 5-10 mg  5-10 mg Oral Q8H PRN Elodia Florence, PA-C       Or  . metoCLOPramide (REGLAN) injection 5-10 mg  5-10 mg Intravenous Q8H PRN Elodia Florence, PA-C      . ondansetron [facility]) tablet 4 mg  4 mg Oral  Q6H PRN Elodia Florence, PA-C       Or  . ondansetron [facility]) injection 4 mg  4 mg Intravenous Q6H PRN Elodia Florence, PA-C         Discharge Medications: Please see discharge summary for a list of discharge medications.  Relevant Imaging Results:  Relevant Lab Results:   Additional Information SSN: [id number]  Rondel Baton, LCSW

## 2016-11-02 NOTE — Progress Notes (Signed)
11/02/16 1705  PT EVALUATION Information  Last PT Received On 11/02/16  Assistance Needed +1  History of Present Illness 55 yr old gentleman s/p left total knee arthroplasty on 11/01/16. Pt  has a past medical history of Depression and Hypertension.  Precautions  Precautions Knee  Precaution Booklet Issued No  Precaution Comments No pillow under knee  Required Braces or Orthoses Knee Immobilizer - Left  Knee Immobilizer - Left On when out of bed or walking  Restrictions  Weight Bearing Restrictions Yes  RLE Weight Bearing WBAT  Home Living  Family/patient expects to be discharged to: Skilled nursing facility  Living Arrangements Alone  Type of Home House  Home Access Stairs to enter  Entrance Stairs-Number of Steps 1  Entrance Stairs-Rails None  Home Layout One level  Bathroom Nurse, children'shower/Tub Tub/shower unit  Bathroom Toilet Standard  Bathroom Accessibility Yes  Home Equipment None  Prior Function  Level of Independence Independent  Comments works as a Arboriculturistcustodian, Scientist, research (life sciences)cook short order  Communication  Communication HOH  Pain Assessment  Pain Assessment 0-10  Pain Score 8  Pain Location left knee  Pain Descriptors / Indicators Grimacing;Guarding  Pain Intervention(s) Limited activity within patient's tolerance;Monitored during session;Repositioned  Cognition  Arousal/Alertness Awake/alert  Behavior During Therapy WFL for tasks assessed/performed  Overall Cognitive Status Within Functional Limits for tasks assessed  Upper Extremity Assessment  Upper Extremity Assessment Defer to OT evaluation  Lower Extremity Assessment  Lower Extremity Assessment LLE deficits/detail  LLE Deficits / Details left leg with normal post op pain and weakness, ankle at least 3/5, knee 2/5, hip 2+/5 flexion  Cervical / Trunk Assessment  Cervical / Trunk Assessment Normal  Bed Mobility  General bed mobility comments Pt OOB in chair   Transfers  Overall transfer level Needs assistance  Equipment used  Rolling walker (2 wheeled)  Transfers Sit to/from Stand  Sit to Stand Min assist  General transfer comment Min assist to support trunk during transitions from low chair.  Verbal cues for safe hand placement. Assist at trunk to power up.    Ambulation/Gait  Ambulation/Gait assistance Min assist;+2 safety/equipment  Ambulation Distance (Feet) 65 Feet  Assistive device Rolling walker (2 wheeled)  Gait Pattern/deviations Step-to pattern;Antalgic  General Gait Details Verbal cues initally for correct LE sequencing.  One family member helping with IV pole, one with chiar to follow that we did not end up using.   Gait velocity decreased  Gait velocity interpretation Below normal speed for age/gender  Balance  Overall balance assessment Needs assistance  Sitting-balance support Feet supported;No upper extremity supported  Sitting balance-Leahy Scale Good  Standing balance support Bilateral upper extremity supported  Standing balance-Leahy Scale Poor  Exercises  Exercises Total Joint  Total Joint Exercises  Ankle Circles/Pumps AROM;Both;20 reps  Quad Sets AROM;Left;10 reps  Towel Squeeze AROM;Both;10 reps  Heel Slides AAROM;Left;10 reps  Goniometric ROM 12-48  PT - End of Session  Equipment Utilized During Treatment Gait belt;Left knee immobilizer  Activity Tolerance Patient limited by pain  Patient left in bed;with call bell/phone within reach;with family/visitor present  PT Assessment  PT Recommendation/Assessment Patient needs continued PT services  PT Problem List Decreased strength;Decreased range of motion;Decreased activity tolerance;Decreased balance;Decreased mobility;Decreased knowledge of use of DME;Decreased knowledge of precautions;Pain  PT Plan  PT Frequency (ACUTE ONLY) 7X/week  PT Treatment/Interventions (ACUTE ONLY) DME instruction;Gait training;Stair training;Functional mobility training;Therapeutic activities;Therapeutic exercise;Balance training;Patient/family  education;Manual techniques;Modalities  PT Recommendation  Follow Up Recommendations SNF  PT equipment Rolling walker with 5"  wheels;3in1 (PT)  Individuals Consulted  Consulted and Agree with Results and Recommendations Patient  Acute Rehab PT Goals  Patient Stated Goal To get back to work  PT Goal Formulation With patient  Time For Goal Achievement 11/09/16  Potential to Achieve Goals Good  PT Time Calculation  PT Start Time (ACUTE ONLY) 1124  PT Stop Time (ACUTE ONLY) 1201  PT Time Calculation (min) (ACUTE ONLY) 37 min  PT General Charges  $$ ACUTE PT VISIT 1 Procedure  PT Evaluation  $PT Eval Moderate Complexity 1 Procedure  PT Treatments  $Gait Training 8-22 mins  Written Expression  Dominant Hand Right  A:  Pt is POD #1 and is moving well, min assist overall with RW, second person to follow with chair, but not needed to increase safety and encourage increased gait distance.  Pt lives alone at baseline and is planning to go to SNF for rehab to regain his independence before going home.   PT to follow acutely for deficits listed below.    Rollene Rotunda Emry Barbato, PT, DPT [phone]

## 2016-11-03 MED ORDER — METHOCARBAMOL 750 MG PO TABS: 750.0000 mg | ORAL_TABLET | Freq: Four times a day (QID) | ORAL | 0 refills | Status: DC | PRN

## 2016-11-03 MED ORDER — ASPIRIN 325 MG PO TBEC
325.0000 mg | DELAYED_RELEASE_TABLET | Freq: Two times a day (BID) | ORAL | 0 refills | Status: DC
Start: 2016-11-03 — End: 2018-11-28

## 2016-11-03 MED ORDER — HYDROCODONE-ACETAMINOPHEN 7.5-325 MG PO TABS: 1.0000 | ORAL_TABLET | ORAL | 0 refills | Status: DC | PRN

## 2016-11-03 NOTE — Progress Notes (Signed)
Physical Therapy Treatment Patient Details Name: Thomas Yoder Grandinetti MRN: [id number] DOB: January 23, 1961 Today's Date: 11/03/2016    History of Present Illness  55 yr old gentleman s/p left total knee arthroplasty on 11/01/16. Pt  has a past medical history of Depression and Hypertension.    PT Comments    Pt presents POD 2 with continued pain at 8/10 in left knee. Performed bed mobs without HOB elevated and showed pt how to use RLE to assist with moving LLE OOB in order to simulate home environment. Pt is now discharging home with HHPT and will benefit from continued instruction next session on bed mobility training and gait with Rw. Instructed pt to use towel roll under heel in order to improve knee extension in sitting.    Follow Up Recommendations  Home health PT     Equipment Recommendations  Rolling walker with 5" wheels;3in1 (PT)    Recommendations for Other Services       Precautions / Restrictions Precautions Precautions: Knee Precaution Booklet Issued: Yes (comment) Precaution Comments: knee exercise handout given and reviewed Required Braces or Orthoses: Knee Immobilizer - Left Knee Immobilizer - Left: On when out of bed or walking Restrictions Weight Bearing Restrictions: Yes LLE Weight Bearing: Weight bearing as tolerated    Mobility  Bed Mobility Overal bed mobility: Needs Assistance Bed Mobility: Supine to Sit     Supine to sit: Min assist     General bed mobility comments: Min a for safety and to help LLE over bed  Transfers Overall transfer level: Needs assistance Equipment used: Rolling walker (2 wheeled) Transfers: Sit to/from Stand Sit to Stand: Min assist;From elevated surface         General transfer comment: Min A for safety to support trunk and stabilize RW. VC's given for proper hand placement.  Ambulation/Gait Ambulation/Gait assistance: Min assist Ambulation Distance (Feet): 85 Feet Assistive device: Rolling walker (2 wheeled) Gait  Pattern/deviations: Step-to pattern;Decreased step length - right;Decreased stance time - left;Antalgic Gait velocity: decreased Gait velocity interpretation: Below normal speed for age/gender General Gait Details: Pt reports upper extremity fatigue.  Significant extra time needed to walk with RW.  Min assist for balance.     Stairs            Wheelchair Mobility    Modified Rankin (Stroke Patients Only)       Balance Overall balance assessment: Needs assistance Sitting-balance support: No upper extremity supported Sitting balance-Leahy Scale: Good     Standing balance support: Bilateral upper extremity supported Standing balance-Leahy Scale: Poor Standing balance comment: Pt used BUE support on forearms for gait                    Cognition Arousal/Alertness: Awake/alert Behavior During Therapy: WFL for tasks assessed/performed Overall Cognitive Status: Within Functional Limits for tasks assessed                      Exercises Total Joint Exercises Ankle Circles/Pumps: AROM;Both;20 reps Quad Sets: AROM;Left;10 reps Towel Squeeze: AROM;Both;10 reps Heel Slides: AAROM;Left;10 reps Goniometric ROM: 9-50    General Comments        Pertinent Vitals/Pain Pain Assessment: 0-10 Pain Score: 8  Pain Location: left knee Pain Descriptors / Indicators: Aching;Guarding;Sore Pain Intervention(s): Monitored during session;Premedicated before session;RN gave pain meds during session;Ice applied    Home Living                      Prior Function  PT Goals (current goals can now be found in the care plan section) Acute Rehab PT Goals Patient Stated Goal: To get back to work Progress towards PT goals: Progressing toward goals    Frequency    7X/week      PT Plan Current plan remains appropriate    Co-evaluation             End of Session Equipment Utilized During Treatment: Gait belt;Left knee immobilizer Activity  Tolerance: Patient limited by pain;Patient limited by fatigue Patient left: in chair;with call bell/phone within reach;with nursing/sitter in room     Time: 1421-1459 PT Time Calculation (min) (ACUTE ONLY): 38 min  Charges:  $Gait Training: 23-37 mins $Therapeutic Exercise: 8-22 mins                    G Codes:      Colin Broach PT, DPT  [phone]  11/03/2016, 3:05 PM

## 2016-11-03 NOTE — Progress Notes (Deleted)
Patient will DC to: [facility] Anticipated DC date: 11/03/16 Family notified: Daughter Transport by: [facility]   Per MD patient ready for DC to [facility]. RN, patient, patient's family, and facility notified of DC. Discharge Summary sent to facility. RN given number for report. DC packet on chart. Ambulance transport requested for patient.   CSW signing off.  Thomas Yoder, LCSWA Clinical Social Worker [phone]  

## 2016-11-03 NOTE — Discharge Summary (Signed)
Patient ID: Thomas Yoder MRN: [id number] DOB/AGE: Apr 10, 1961 55 y.o.  Admit date: 11/01/2016 Discharge date: 11/04/2016  Admission Diagnoses:  Principal Problem:   Primary osteoarthritis of left knee   Discharge Diagnoses:  Same  Past Medical History:  Diagnosis Date  . Depression   . Hypertension     Surgeries: Procedure(s): TOTAL KNEE ARTHROPLASTY on 11/01/2016   Consultants:   Discharged Condition: Improved  Hospital Course: Thomas Yoder is an 55 y.o. male who was admitted 11/01/2016 for operative treatment ofPrimary osteoarthritis of left knee. Patient has severe unremitting pain that affects sleep, daily activities, and work/hobbies. After pre-op clearance the patient was taken to the operating room on 11/01/2016 and underwent  Procedure(s): TOTAL KNEE ARTHROPLASTY.    Patient was given perioperative antibiotics: Anti-infectives    Start     Dose/Rate Route Frequency Ordered Stop   11/01/16 2000  ceFAZolin (ANCEF) IVPB 2g/100 mL premix     2 g 200 mL/hr over 30 Minutes Intravenous Every 6 hours 11/01/16 1735 11/02/16 0130   11/01/16 0600  ceFAZolin (ANCEF) IVPB 2g/100 mL premix     2 g 200 mL/hr over 30 Minutes Intravenous On call to O.R. 10/31/16 1137 11/01/16 1354       Patient was given sequential compression devices, early ambulation, and chemoprophylaxis to prevent DVT.  Patient benefited maximally from hospital stay and there were no complications.    Recent vital signs: Patient Vitals for the past 24 hrs:  BP Temp Temp src Pulse Resp SpO2  11/03/16 0505 (!) 141/96 98.2 F (36.8 C) Oral (!) 101 16 100 %  11/02/16 2025 (!) 166/54 98.6 F (37 C) Oral (!) 102 18 98 %  11/02/16 1600 (!) 157/89 - - 97 - -  11/02/16 1500 (!) 148/86 98 F (36.7 C) Oral (!) 102 18 97 %     Recent laboratory studies: No results for input(s): WBC, HGB, HCT, PLT, NA, K, CL, CO2, BUN, CREATININE, GLUCOSE, INR, CALCIUM in the last 72 hours.  Invalid input(s): PT,  2   Discharge Medications:     Medication List    TAKE these medications   Adult Blood Pressure Cuff Lg Kit Check blood pressure daily and record readings.   aspirin 325 MG EC tablet Take 1 tablet (325 mg total) by mouth 2 (two) times daily after a meal.   escitalopram 10 MG tablet Commonly known as:  LEXAPRO Take 1 tablet (10 mg total) by mouth daily.   hydrochlorothiazide 12.5 MG tablet Commonly known as:  HYDRODIURIL Take 2 tablets (25 mg total) by mouth daily.   HYDROcodone-acetaminophen 7.5-325 MG tablet Commonly known as:  NORCO Take 1-2 tablets by mouth every 4 (four) hours as needed for moderate pain or severe pain.   lisinopril 20 MG tablet Commonly known as:  PRINIVIL,ZESTRIL Take 2 tablets (40 mg total) by mouth daily.   LORazepam 0.5 MG tablet Commonly known as:  ATIVAN Take 1 tablet (0.5 mg total) by mouth daily as needed for anxiety.   methocarbamol 750 MG tablet Commonly known as:  ROBAXIN Take 1 tablet (750 mg total) by mouth every 6 (six) hours as needed for muscle spasms.            Durable Medical Equipment        Start     Ordered   11/01/16 1736  DME Walker rolling  Once     11/01/16 1735   11/01/16 1736  DME 3 n 1  Once     11/01/16  1735   11/01/16 1736  DME Bedside commode  Once     11/01/16 1735      Diagnostic Studies: Dg Chest 2 View  Result Date: 10/24/2016 CLINICAL DATA:  Knee surgery. EXAM: CHEST  2 VIEW COMPARISON:  05/05/2015. FINDINGS: Mediastinum hilar structures are normal. Borderline cardiomegaly. Low lung volumes. No focal infiltrate noted on today's exam. No pleural effusion or pneumothorax. Stable left-sided pleural thickening consistent scarring. Degenerative changes thoracic spine. IMPRESSION: Borderline cardiomegaly.  Low lung volumes. Electronically Signed   By: Marcello Moores  Register   On: 10/24/2016 09:17    Disposition: 01-Home or Self Care  Discharge Instructions    Call MD / Call 911    Complete by:  As  directed    If you experience chest pain or shortness of breath, CALL 911 and be transported to the hospital emergency room.  If you develope a fever above 101 F, pus (white drainage) or increased drainage or redness at the wound, or calf pain, call your surgeon's office.   Constipation Prevention    Complete by:  As directed    Drink plenty of fluids.  Prune juice may be helpful.  You may use a stool softener, such as Colace (over the counter) 100 mg twice a day.  Use MiraLax (over the counter) for constipation as needed.   Diet - low sodium heart healthy    Complete by:  As directed    Discharge instructions    Complete by:  As directed    INSTRUCTIONS AFTER JOINT REPLACEMENT   Remove items at home which could result in a fall. This includes throw rugs or furniture in walking pathways ICE to the affected joint every three hours while awake for 30 minutes at a time, for at least the first 3-5 days, and then as needed for pain and swelling.  Continue to use ice for pain and swelling. You may notice swelling that will progress down to the foot and ankle.  This is normal after surgery.  Elevate your leg when you are not up walking on it.   Continue to use the breathing machine you got in the hospital (incentive spirometer) which will help keep your temperature down.  It is common for your temperature to cycle up and down following surgery, especially at night when you are not up moving around and exerting yourself.  The breathing machine keeps your lungs expanded and your temperature down.   DIET:  As you were doing prior to hospitalization, we recommend a well-balanced diet.  DRESSING / WOUND CARE / SHOWERING  You may shower 3 days after surgery, but keep the wounds dry during showering.  You may use an occlusive plastic wrap (Press'n Seal for example), NO SOAKING/SUBMERGING IN THE BATHTUB.  If the bandage gets wet, change with a clean dry gauze.  If the incision gets wet, pat the wound dry with  a clean towel.  ACTIVITY  Increase activity slowly as tolerated, but follow the weight bearing instructions below.   No driving for 6 weeks or until further direction given by your physician.  You cannot drive while taking narcotics.  No lifting or carrying greater than 10 lbs. until further directed by your surgeon. Avoid periods of inactivity such as sitting longer than an hour when not asleep. This helps prevent blood clots.  You may return to work once you are authorized by your doctor.     WEIGHT BEARING   Weight bearing as tolerated with assist device (walker, cane,  etc) as directed, use it as long as suggested by your surgeon or therapist, typically at least 4-6 weeks.   EXERCISES  Results after joint replacement surgery are often greatly improved when you follow the exercise, range of motion and muscle strengthening exercises prescribed by your doctor. Safety measures are also important to protect the joint from further injury. Any time any of these exercises cause you to have increased pain or swelling, decrease what you are doing until you are comfortable again and then slowly increase them. If you have problems or questions, call your caregiver or physical therapist for advice.   Rehabilitation is important following a joint replacement. After just a few days of immobilization, the muscles of the leg can become weakened and shrink (atrophy).  These exercises are designed to build up the tone and strength of the thigh and leg muscles and to improve motion. Often times heat used for twenty to thirty minutes before working out will loosen up your tissues and help with improving the range of motion but do not use heat for the first two weeks following surgery (sometimes heat can increase post-operative swelling).   These exercises can be done on a training (exercise) mat, on the floor, on a table or on a bed. Use whatever works the best and is most comfortable for you.    Use music or  television while you are exercising so that the exercises are a pleasant break in your day. This will make your life better with the exercises acting as a break in your routine that you can look forward to.   Perform all exercises about fifteen times, three times per day or as directed.  You should exercise both the operative leg and the other leg as well.   Exercises include:   Quad Sets - Tighten up the muscle on the front of the thigh (Quad) and hold for 5-10 seconds.   Straight Leg Raises - With your knee straight (if you were given a brace, keep it on), lift the leg to 60 degrees, hold for 3 seconds, and slowly lower the leg.  Perform this exercise against resistance later as your leg gets stronger.  Leg Slides: Lying on your back, slowly slide your foot toward your buttocks, bending your knee up off the floor (only go as far as is comfortable). Then slowly slide your foot back down until your leg is flat on the floor again.  Angel Wings: Lying on your back spread your legs to the side as far apart as you can without causing discomfort.  Hamstring Strength:  Lying on your back, push your heel against the floor with your leg straight by tightening up the muscles of your buttocks.  Repeat, but this time bend your knee to a comfortable angle, and push your heel against the floor.  You may put a pillow under the heel to make it more comfortable if necessary.   A rehabilitation program following joint replacement surgery can speed recovery and prevent re-injury in the future due to weakened muscles. Contact your doctor or a physical therapist for more information on knee rehabilitation.    CONSTIPATION  Constipation is defined medically as fewer than three stools per week and severe constipation as less than one stool per week.  Even if you have a regular bowel pattern at home, your normal regimen is likely to be disrupted due to multiple reasons following surgery.  Combination of anesthesia,  postoperative narcotics, change in appetite and fluid intake all  can affect your bowels.   YOU MUST use at least one of the following options; they are listed in order of increasing strength to get the job done.  They are all available over the counter, and you may need to use some, POSSIBLY even all of these options:    Drink plenty of fluids (prune juice may be helpful) and high fiber foods Colace 100 mg by mouth twice a day  Senokot for constipation as directed and as needed Dulcolax (bisacodyl), take with full glass of water  Miralax (polyethylene glycol) once or twice a day as needed.  If you have tried all these things and are unable to have a bowel movement in the first 3-4 days after surgery call either your surgeon or your primary doctor.    If you experience loose stools or diarrhea, hold the medications until you stool forms back up.  If your symptoms do not get better within 1 week or if they get worse, check with your doctor.  If you experience "the worst abdominal pain ever" or develop nausea or vomiting, please contact the office immediately for further recommendations for treatment.   ITCHING:  If you experience itching with your medications, try taking only a single pain pill, or even half a pain pill at a time.  You can also use Benadryl over the counter for itching or also to help with sleep.   TED HOSE STOCKINGS:  Use stockings on both legs until for at least 2 weeks or as directed by physician office. They may be removed at night for sleeping.  MEDICATIONS:  See your medication summary on the "After Visit Summary" that nursing will review with you.  You may have some home medications which will be placed on hold until you complete the course of blood thinner medication.  It is important for you to complete the blood thinner medication as prescribed.  PRECAUTIONS:  If you experience chest pain or shortness of breath - call 911 immediately for transfer to the hospital emergency  department.   If you develop a fever greater that 101 F, purulent drainage from wound, increased redness or drainage from wound, foul odor from the wound/dressing, or calf pain - CONTACT YOUR SURGEON.                                                   FOLLOW-UP APPOINTMENTS:  If you do not already have a post-op appointment, please call the office for an appointment to be seen by your surgeon.  Guidelines for how soon to be seen are listed in your "After Visit Summary", but are typically between 1-4 weeks after surgery.  OTHER INSTRUCTIONS:   Knee Replacement:  Do not place pillow under knee, focus on keeping the knee straight while resting. CPM instructions: 0-90 degrees, 2 hours in the morning, 2 hours in the afternoon, and 2 hours in the evening. Place foam block, curve side up under heel at all times except when in CPM or when walking.  DO NOT modify, tear, cut, or change the foam block in any way.  MAKE SURE YOU:  Understand these instructions.  Get help right away if you are not doing well or get worse.    Thank you for letting us be a part of your medical care team.  It is a privilege we respect greatly.  We hope these instructions will help you stay on track for a fast and full recovery!   Increase activity slowly as tolerated    Complete by:  As directed       Follow-up Information    DALLDORF,PETER G, MD. Schedule an appointment as soon as possible for a visit in 2 weeks.   Specialty:  Orthopedic Surgery Contact information: [address] [place] [address] [phone]            Signed: Rich Fuchs 11/03/2016, 1:06 PM

## 2016-11-03 NOTE — Care Management Note (Signed)
Case Management Note  Patient Details  Name: Thomas Yoder MRN: [id number] Date of Birth: 29-Jul-1961  Subjective/Objective:  55 yr old gentleman s/p left total knee arthroplasty.                  Action/Plan: Case manager has spoken with patient earlier this week concerning discharge plan. Patient initially wanted to go to SNF, per therapy patient is doing quite well and will be able to discharge home. Patient was preoperatively setup with [facility], CM contacted Antonietta Barcelona, Liaison for [facility] and informed her of patient's planned discharge. Patient will be arranging for friends to assist  Him at discharge.    Expected Discharge Date:   11/04/16               Expected Discharge Plan:  Home w Home Health Services  In-House Referral:  NA  Discharge planning Services  CM Consult  Post Acute Care Choice:  Home Health, Durable Medical Equipment Choice offered to:  Patient  DME Arranged:  Walker rolling, CPM DME Agency:  [employer]  HH Arranged:  PT HH Agency:  [facility]  Status of Service:  Completed, signed off  If discussed at Long Length of Stay Meetings, dates discussed:    Additional Comments:  Durenda Guthrie, RN 11/03/2016, 2:20 PM

## 2016-11-03 NOTE — Progress Notes (Signed)
Subjective: 2 Days Post-Op Procedure(s) (LRB): TOTAL KNEE ARTHROPLASTY (Left)   Patient is doing better and is trying to get help arranged at home so he can d/c there instead of SNF.  Activity level:  wbat Diet tolerance:  ok Voiding:  ok Patient reports pain as mild and moderate.    Objective: Vital signs in last 24 hours: Temp:  [98 F (36.7 C)-98.6 F (37 C)] 98.2 F (36.8 C) (11/16 0505) Pulse Rate:  [97-102] 101 (11/16 0505) Resp:  [16-18] 16 (11/16 0505) BP: (141-166)/(54-96) 141/96 (11/16 0505) SpO2:  [97 %-100 %] 100 % (11/16 0505)  Labs: No results for input(s): HGB in the last 72 hours. No results for input(s): WBC, RBC, HCT, PLT in the last 72 hours. No results for input(s): NA, K, CL, CO2, BUN, CREATININE, GLUCOSE, CALCIUM in the last 72 hours. No results for input(s): LABPT, INR in the last 72 hours.  Physical Exam:  Neurologically intact ABD soft Neurovascular intact Sensation intact distally Intact pulses distally Dorsiflexion/Plantar flexion intact Incision: dressing C/D/I and no drainage No cellulitis present Compartment soft  Assessment/Plan:  2 Days Post-Op Procedure(s) (LRB): TOTAL KNEE ARTHROPLASTY (Left) Advance diet Up with therapy Plan for discharge tomorrow Discharge home with home health  Continue on ASA 325mg  BID x 2 weeks post op. Follow up in office 2 weeks post op. I changed dressing to aquacel today.  Thomas Yoder, Ginger Organ 11/03/2016, 1:02 PM

## 2016-11-03 NOTE — Progress Notes (Signed)
Orthopedic Tech Progress Note Patient Details:  Thomas Yoder [birth year] [id number]  Patient ID: Thomas Yoder, male   DOB: [birth year], 55 y.o.   MRN: [id number]   Thomas Yoder 11/03/2016, 1:29 PM Placed pt's lle on cpm @0 -55 degrees @1325 ; RN notified

## 2016-11-03 NOTE — Progress Notes (Signed)
11/03/16 1638  PT Visit Information  Last PT Received On 11/03/16  Assistance Needed +1  History of Present Illness 55 yr old gentleman s/p left total knee arthroplasty on 11/01/16. Pt  has a past medical history of Depression and Hypertension.  Subjective Data  Subjective Pt reports that he is trying not to take so much pain meds as he is "out of it"  Patient Stated Goal To get back to work  Precautions  Precautions Knee  Precaution Booklet Issued Yes (comment)  Precaution Comments knee exercise handout given and reviewed  Required Braces or Orthoses Knee Immobilizer - Left  Knee Immobilizer - Left On when out of bed or walking  Restrictions  RLE Weight Bearing WBAT  Pain Assessment  Pain Assessment 0-10  Pain Score 8  Pain Location left knee when mobilizing, 0 at rest  Pain Descriptors / Indicators Aching;Burning;Grimacing;Guarding  Pain Intervention(s) Limited activity within patient's tolerance;Monitored during session;Repositioned;Ice applied  Cognition  Arousal/Alertness Lethargic  Behavior During Therapy WFL for tasks assessed/performed  Overall Cognitive Status Within Functional Limits for tasks assessed  Bed Mobility  General bed mobility comments Pt OOB in recliner chair  Transfers  Overall transfer level Needs assistance  Equipment used Rolling walker (2 wheeled)  Transfers Sit to/from Stand  Sit to Stand Min assist  General transfer comment Min assist from lower recliner chair to support trunk for balance and stabilize RW.  Pt with wide BOS and likes to put good leg inside of RW while surgical leg is outside to come to standing.   Ambulation/Gait  Ambulation/Gait assistance Min assist  Ambulation Distance (Feet) 20 Feet  Assistive device Rolling walker (2 wheeled)  Gait Pattern/deviations Step-to pattern;Antalgic  General Gait Details Pt with less antalgic gait pattern than yesterday.  Verbal cues for safe RW use as he started picking it up vs sliding it.   Gait  velocity decreased  Stairs Yes  Stairs assistance Min assist  Stair Management Step to pattern;Forwards;With walker  Number of Stairs 1  General stair comments Pt was only able to get enough leverage to get up the one step with RW tilted and PT providing stability vs putting the whole walker up on the step (we did not try backwards).  He practiced this twice and needs min assist at his trunk and to stabilize the RW to do safely.    Balance  Overall balance assessment Needs assistance  Sitting-balance support Feet supported;No upper extremity supported  Sitting balance-Leahy Scale Good  Standing balance support Bilateral upper extremity supported  Standing balance-Leahy Scale Poor  Exercises  Exercises Total Joint  Total Joint Exercises  Long Arc Quad AAROM;Left;10 reps  Knee Flexion AROM;10 reps;Seated  PT - End of Session  Equipment Utilized During Treatment Gait belt;Left knee immobilizer  Activity Tolerance Patient limited by pain;Patient limited by fatigue  Patient left in chair;with call bell/phone within reach  PT - Assessment/Plan  PT Plan Current plan remains appropriate  PT Frequency (ACUTE ONLY) 7X/week  Follow Up Recommendations Home health PT  PT equipment Rolling walker with 5" wheels;3in1 (PT)  PT Goal Progression  Progress towards PT goals Progressing toward goals  PT Time Calculation  PT Start Time (ACUTE ONLY) 1610  PT Stop Time (ACUTE ONLY) 1636  PT Time Calculation (min) (ACUTE ONLY) 26 min  PT General Charges  $$ ACUTE PT VISIT 1 Procedure  PT Treatments  $Gait Training 8-22 mins  $Therapeutic Exercise 8-22 mins  A: Pt was able to initiate stair  training this PM and preform his seated LE exercises.  He continues to need min assist to mobilize safely and if he is to go home he will need a few days of assist for mobility to be safe.  PT will reinforce stair training in AM in anticipation of d/c tomorrow.  Rollene Rotunda Darrow Barreiro, PT, DPT [phone]

## 2016-11-04 NOTE — Progress Notes (Signed)
Physical Therapy Treatment Patient Details Name: Thomas Yoder MRN: [id number] DOB: June 09, 1961 Today's Date: 11/04/2016    History of Present Illness  55 yr old gentleman s/p left total knee arthroplasty on 11/01/16. Pt  has a past medical history of Depression and Hypertension.    PT Comments    Pt is progressing well with his mobility, but still tries to do things that he shouldn't be doing yet and needs cues for safety throughout his session. Min guard assist overall for gait and min assist to stabilize RW only on stairs.  He is safe for d/c home and plans to leave at noon today.   PT to follow acutely until d/c confirmed.     Follow Up Recommendations  Home health PT;Supervision for mobility/OOB     Equipment Recommendations  Rolling walker with 5" wheels;3in1 (PT)    Recommendations for Other Services   NA     Precautions / Restrictions Precautions Precautions: Knee;Fall Precaution Booklet Issued: Yes (comment) Precaution Comments: Pt is a bit impulsive and needed cues for safety today, no pillow under surgical knee also reviewed Required Braces or Orthoses: Knee Immobilizer - Left Knee Immobilizer - Left: On when out of bed or walking Restrictions Weight Bearing Restrictions: Yes LLE Weight Bearing: Weight bearing as tolerated    Mobility  Bed Mobility Overal bed mobility: Needs Assistance Bed Mobility: Supine to Sit     Supine to sit: Modified independent (Device/Increase time)     General bed mobility comments: Pt moving EOB without Pt assisting. KI donned in bed  Transfers Overall transfer level: Needs assistance Equipment used: Rolling walker (2 wheeled) Transfers: Sit to/from Stand Sit to Stand: Supervision         General transfer comment: supervision for safety as he gets up without his RW directly in front of him and he crashes with uncontrolled descent down to sitting.   Ambulation/Gait Ambulation/Gait assistance: Min guard Ambulation Distance  (Feet): 150 Feet Assistive device: Rolling walker (2 wheeled) Gait Pattern/deviations: Step-through pattern;Antalgic Gait velocity: decreased Gait velocity interpretation: Below normal speed for age/gender General Gait Details: Verbal cues for safe RW use, he at times lets go with one hand to, "try it out".  I advised him to continue to use the RW to prevent falls vs "trying it out".  His home PT will help him progress to a cane   Stairs Stairs: Yes Stairs assistance: Min assist Stair Management: No rails;Step to pattern;Forwards;With walker Number of Stairs: 1 General stair comments: Pt goes forward with RW slanted for better leverage.  Min assist to stabilize RW. Verbal cues to remind him of safe LE seqencing.          Balance Overall balance assessment: Needs assistance Sitting-balance support: Feet supported;No upper extremity supported Sitting balance-Leahy Scale: Good     Standing balance support: Bilateral upper extremity supported;No upper extremity supported;Single extremity supported Standing balance-Leahy Scale: Fair                      Cognition Arousal/Alertness: Awake/alert Behavior During Therapy: Impulsive Overall Cognitive Status: Within Functional Limits for tasks assessed                      Exercises Total Joint Exercises Short Arc QuadBarbaraann Yoder: AAROM;Left;10 reps Hip ABduction/ADduction: AAROM;Left;10 reps Straight Leg Raises: AAROM;Left;10 reps Goniometric ROM: 8-65        Pertinent Vitals/Pain Pain Assessment: 0-10 Pain Score: 7  Pain Location: left knee Pain Descriptors /  Indicators: Aching;Burning;Grimacing;Guarding Pain Intervention(s): Limited activity within patient's tolerance;Monitored during session;Repositioned           PT Goals (current goals can now be found in the care plan section) Acute Rehab PT Goals Patient Stated Goal: To get back to work Progress towards PT goals: Progressing toward goals     Frequency    7X/week      PT Plan Current plan remains appropriate       End of Session Equipment Utilized During Treatment: Left knee immobilizer Activity Tolerance: Patient limited by pain Patient left: in chair;with call bell/phone within reach     Time: 1050-1131 PT Time Calculation (min) (ACUTE ONLY): 41 min  Charges:  $Gait Training: 23-37 mins $Therapeutic Exercise: 8-22 mins                      Thomas Yoder, PT, DPT [phone]   11/04/2016, 11:37 AM

## 2016-11-04 NOTE — Progress Notes (Signed)
   PATIENT ID: Thomas Yoder   3 Days Post-Op Procedure(s) (LRB): TOTAL KNEE ARTHROPLASTY (Left)  Subjective: Doing well, knee is sore. Minimizing pain medicine. Reports okay to go home today after another PT session. No other complaints.   Objective:  Vitals:   11/03/16 1940 11/04/16 0430  BP: (!) 152/89 120/82  Pulse: (!) 101 94  Resp: 16 16  Temp: 98.7 F (37.1 C) 97.8 F (36.6 C)     Neurologically intact ABD soft Neurovascular intact Sensation intact distally Intact pulses distally Dorsiflexion/Plantar flexion intact Incision: dressing C/D/I and no drainage No cellulitis present Compartment soft   Labs:  No results for input(s): HGB in the last 72 hours.No results for input(s): WBC, RBC, HCT, PLT in the last 72 hours.No results for input(s): NA, K, CL, CO2, BUN, CREATININE, GLUCOSE, CALCIUM in the last 72 hours.  Assessment and Plan: 3 Days Post-Op Procedure(s) (LRB): TOTAL KNEE ARTHROPLASTY (Left) Advance diet Up with therapy Plan for discharge this afternoon Discharge home with home health  Continue on ASA 325mg  BID x 2 weeks post op. Follow up in office 2 weeks post op.

## 2017-01-17 ENCOUNTER — Ambulatory Visit: Payer: BC Managed Care – PPO | Admitting: Cardiology

## 2017-01-17 NOTE — Progress Notes (Deleted)
(  Electrophysiology Office Note   Date:  01/17/2017   ID:  Thomas Yoder, DOB [birth year], MRN [id number]  PCP:  Jenny Reichmann, MD  Cardiologist:   Constance Haw, MD    No chief complaint on file.    History of Present Illness: Thomas Yoder is a 56 y.o. male who presents today for electrophysiology evaluation.   History of hypertension, tobacco abuse, obesity. She is planned have knee surgery in the future. He presented to urgent care for a health screening, and was found to have quite elevated blood pressures. His blood pressure today in the office are also quite elevated.    Today, he denies symptoms of palpitations, chest pain, shortness of breath, orthopnea, PND, lower extremity edema, claudication, dizziness, presyncope, syncope, bleeding, or neurologic sequela. The patient is tolerating medications without difficulties and is otherwise without complaint today.    Past Medical History:  Diagnosis Date  . Depression   . Hypertension    Past Surgical History:  Procedure Laterality Date  . KNEE SURGERY    . TOTAL KNEE ARTHROPLASTY Left 11/01/2016   Procedure: TOTAL KNEE ARTHROPLASTY;  Surgeon: Melrose Nakayama, MD;  Location: [facility];  Service: Orthopedics;  Laterality: Left;     Current Outpatient Prescriptions  Medication Sig Dispense Refill  . aspirin EC 325 MG EC tablet Take 1 tablet (325 mg total) by mouth 2 (two) times daily after a meal. 30 tablet 0  . Blood Pressure Monitoring (ADULT BLOOD PRESSURE CUFF LG) KIT Check blood pressure daily and record readings. (Patient not taking: Reported on 10/21/2016) 1 each 0  . escitalopram (LEXAPRO) 10 MG tablet Take 1 tablet (10 mg total) by mouth daily. (Patient not taking: Reported on 10/21/2016) 60 tablet 1  . hydrochlorothiazide (HYDRODIURIL) 12.5 MG tablet Take 2 tablets (25 mg total) by mouth daily. 90 tablet 3  . HYDROcodone-acetaminophen (NORCO) 7.5-325 MG tablet Take 1-2 tablets by mouth every 4 (four) hours as needed  for moderate pain or severe pain. 40 tablet 0  . lisinopril (PRINIVIL,ZESTRIL) 20 MG tablet Take 2 tablets (40 mg total) by mouth daily. 30 tablet 6  . LORazepam (ATIVAN) 0.5 MG tablet Take 1 tablet (0.5 mg total) by mouth daily as needed for anxiety. 30 tablet 0  . methocarbamol (ROBAXIN) 750 MG tablet Take 1 tablet (750 mg total) by mouth every 6 (six) hours as needed for muscle spasms. 40 tablet 0   No current facility-administered medications for this visit.     Allergies:   Patient has no known allergies.   Social History:  The patient  reports that he has been smoking Cigarettes.  He has a 0.90 pack-year smoking history. He has never used smokeless tobacco. He reports that he does not drink alcohol or use drugs.   Family History:  The patient's family history includes Hypertension in his father and mother.    ROS:  Please see the history of present illness.   Otherwise, review of systems is positive for ***.   All other systems are reviewed and negative.    PHYSICAL EXAM: VS:  There were no vitals taken for this visit. , BMI There is no height or weight on file to calculate BMI. GEN: Well nourished, well developed, in no acute distress  HEENT: normal  Neck: no JVD, carotid bruits, or masses Cardiac: ***RRR; no murmurs, rubs, or gallops,no edema  Respiratory:  clear to auscultation bilaterally, normal work of breathing GI: soft, nontender, nondistended, + BS MS: no deformity or  atrophy  Skin: warm and dry Neuro:  Strength and sensation are intact Psych: euthymic mood, full affect  EKG:  EKG is not ordered today. Personal review of the ekg ordered shows sinus rhythm, LVH with diffuse TWI***  Recent Labs: 10/05/2016: ALT 10; TSH 0.97 10/24/2016: BUN 14; Creatinine, Ser 1.16; Hemoglobin 14.4; Platelets 181; Potassium 4.2; Sodium 141    Lipid Panel     Component Value Date/Time   CHOL 210 (H) 08/21/2012 1415   TRIG 531 (H) 08/21/2012 1415   HDL 31 (L) 08/21/2012 1415    CHOLHDL 6.8 08/21/2012 1415   VLDL NOT CALC 08/21/2012 1415   Thomas Yoder  08/21/2012 1415     Comment:       Not calculated due to Triglyceride >400. Suggest ordering Direct LDL (Unit Code: [phone]).   Total Cholesterol/HDL Ratio:CHD Risk                        Coronary Heart Disease Risk Table                                        Men       Women          1/2 Average Risk              3.4        3.3              Average Risk              5.0        4.4           2X Average Risk              9.6        7.1           3X Average Risk             23.4       11.0 Use the calculated Patient Ratio above and the CHD Risk table  to determine the patient's CHD Risk. ATP III Classification (LDL):       < 100        mg/dL         Optimal      100 - 129     mg/dL         Near or Above Optimal      130 - 159     mg/dL         Borderline High      160 - 189     mg/dL         High       > 190        mg/dL         Very High       Wt Readings from Last 3 Encounters:  11/01/16 232 lb (105.2 kg)  10/24/16 232 lb 12.8 oz (105.6 kg)  10/18/16 229 lb (103.9 kg)      Other studies Reviewed: Additional studies/ records that were reviewed today include: Epic notes***   ASSESSMENT AND PLAN:  1.  Hypertension: Blood pressure is quite elevated today, but he has not yet had any of his medications. He was recently prescribed 20 mg of lisinopril as well as HCTZ. It is likely that he Thomas Yoder need more medications to better control  his blood pressure. We Thomas Yoder have him return to clinic on Friday for a blood pressure recheck. I've also encouraged him to monitor his blood pressure at home.      Current medicines are reviewed at length with the patient today.   The patient does not have concerns regarding his medicines.  The following changes were made today:  none  Labs/ tests ordered today include:  No orders of the defined types were placed in this encounter.    Disposition:   FU with Thomas Yoder 3  months  Signed, Thomas Lukas Meredith Leeds, MD  01/17/2017 8:36 AM     [facility] [address] [place] [address] [phone] (office) [phone] (fax)

## 2017-01-18 ENCOUNTER — Encounter: Payer: Self-pay | Admitting: Cardiology

## 2017-02-15 ENCOUNTER — Ambulatory Visit (INDEPENDENT_AMBULATORY_CARE_PROVIDER_SITE_OTHER): Payer: BC Managed Care – PPO

## 2017-02-15 ENCOUNTER — Encounter: Payer: Self-pay | Admitting: Family Medicine

## 2017-02-15 ENCOUNTER — Ambulatory Visit (INDEPENDENT_AMBULATORY_CARE_PROVIDER_SITE_OTHER): Payer: BC Managed Care – PPO | Admitting: Family Medicine

## 2017-02-15 VITALS — BP 177/127 | HR 87 | Temp 98.1°F | Ht 67.5 in | Wt 230.4 lb

## 2017-02-15 DIAGNOSIS — I10 Essential (primary) hypertension: Secondary | ICD-10-CM

## 2017-02-15 DIAGNOSIS — J209 Acute bronchitis, unspecified: Secondary | ICD-10-CM

## 2017-02-15 DIAGNOSIS — E118 Type 2 diabetes mellitus with unspecified complications: Secondary | ICD-10-CM

## 2017-02-15 LAB — POCT GLYCOSYLATED HEMOGLOBIN (HGB A1C): Hemoglobin A1C: 6.7

## 2017-02-15 MED ORDER — AZITHROMYCIN 250 MG PO TABS: ORAL_TABLET | ORAL | 0 refills | Status: DC

## 2017-02-15 MED ORDER — METFORMIN HCL 500 MG PO TABS: 500.0000 mg | ORAL_TABLET | Freq: Two times a day (BID) | ORAL | 3 refills | Status: DC

## 2017-02-15 MED ORDER — HYDROCHLOROTHIAZIDE 12.5 MG PO TABS: 25.0000 mg | ORAL_TABLET | Freq: Every day | ORAL | 3 refills | Status: DC

## 2017-02-15 MED ORDER — LISINOPRIL 20 MG PO TABS: 40.0000 mg | ORAL_TABLET | Freq: Every day | ORAL | 2 refills | Status: DC

## 2017-02-15 NOTE — Progress Notes (Signed)
Patient ID: Thomas Yoder, male    DOB: [birth year], 56 y.o.   MRN: [id number]  PCP: Jenny Reichmann, MD  Chief Complaint  Patient presents with  . URI    X 3 days  yellow mucus with SOB    Subjective:  HPI  56 year old male presents for evaluation of upper respiratory illness and hypertension.  Acute Bronchitis  Coughing and sneezing over the last few weeks. Shortness of breath, wheezing, chest tightness. No sore throat or muscle or joint pain.  No fever or headache. Feels sensation of fluid on the left side.  Hypertension  Last time he took blood pressure medication was three days ago No monitoring of blood pressure No sharp chest pain, no dizziness,  He is currently out of medication. Recent knee replacement and he has noticed mild edema of the knee and lower ankles.  Reports increased urination and increase thirst over the last few months.  Social History   Social History  . Marital status: Single    Spouse name: N/A  . Number of children: N/A  . Years of education: N/A   Occupational History  . Not on file.   Social History Main Topics  . Smoking status: Current Every Day Smoker    Packs/day: 0.30    Years: 3.00    Types: Cigarettes  . Smokeless tobacco: Never Used  . Alcohol use No  . Drug use: No  . Sexual activity: Not on file   Other Topics Concern  . Not on file   Social History Narrative  . No narrative on file    Family History  Problem Relation Age of Onset  . Hypertension Mother   . Hypertension Father    Review of Systems See HPI Patient Active Problem List   Diagnosis Date Noted  . Primary osteoarthritis of left knee 11/01/2016  . Tobacco use disorder 09/18/2014  . Essential hypertension, benign 08/21/2012  . BMI 34.0-34.9,adult 08/21/2012  . Knee osteoarthritis 08/21/2012    No Known Allergies  Prior to Admission medications   Medication Sig Start Date End Date Taking? Authorizing Provider  aspirin EC 325 MG EC tablet Take 1  tablet (325 mg total) by mouth 2 (two) times daily after a meal. 11/03/16  Yes Loni Dolly, PA-C  Blood Pressure Monitoring (ADULT BLOOD PRESSURE CUFF LG) KIT Check blood pressure daily and record readings. 10/05/16  Yes Zoe A Nolon Rod, MD  escitalopram (LEXAPRO) 10 MG tablet Take 1 tablet (10 mg total) by mouth daily. 01/23/16  Yes Jaynee Eagles, PA-C  hydrochlorothiazide (HYDRODIURIL) 12.5 MG tablet Take 2 tablets (25 mg total) by mouth daily. 10/21/16  Yes Will Meredith Leeds, MD  HYDROcodone-acetaminophen (NORCO) 7.5-325 MG tablet Take 1-2 tablets by mouth every 4 (four) hours as needed for moderate pain or severe pain. 11/03/16  Yes Loni Dolly, PA-C  lisinopril (PRINIVIL,ZESTRIL) 20 MG tablet Take 2 tablets (40 mg total) by mouth daily. 10/21/16  Yes Will Meredith Leeds, MD  LORazepam (ATIVAN) 0.5 MG tablet Take 1 tablet (0.5 mg total) by mouth daily as needed for anxiety. 01/06/16  Yes Jaynee Eagles, PA-C  methocarbamol (ROBAXIN) 750 MG tablet Take 1 tablet (750 mg total) by mouth every 6 (six) hours as needed for muscle spasms. 11/03/16  Yes Loni Dolly, PA-C    Past Medical, Surgical Family and Social History reviewed and updated.    Objective:   Today's Vitals   02/15/17 1638 02/15/17 1639  BP: (!) 180/120 (!) 177/127  Pulse: 87  Temp: 98.1 F (36.7 C)   TempSrc: Oral   SpO2: 98%   Weight: 230 lb 6.4 oz (104.5 kg)   Height: 5' 7.5" (1.715 m)     Wt Readings from Last 3 Encounters:  02/15/17 230 lb 6.4 oz (104.5 kg)  11/01/16 232 lb (105.2 kg)  10/24/16 232 lb 12.8 oz (105.6 kg)    Physical Exam  Constitutional: He is oriented to person, place, and time. He appears well-developed. He has a sickly appearance.  HENT:  Head: Normocephalic and atraumatic.  Right Ear: Hearing, tympanic membrane, external ear and ear canal normal.  Left Ear: Hearing, tympanic membrane, external ear and ear canal normal.  Nose: Mucosal edema and rhinorrhea present.  Mouth/Throat: Uvula is midline.    Neck: Normal range of motion. Neck supple. No thyromegaly present.  Cardiovascular: Normal rate, regular rhythm, normal heart sounds and intact distal pulses.   No murmur heard. Pulmonary/Chest: He has decreased breath sounds in the right upper field, the right middle field, the right lower field, the left upper field, the left middle field and the left lower field.  Dyspnea noted during exam   Lymphadenopathy:    He has no cervical adenopathy.  Neurological: He is alert and oriented to person, place, and time.  Skin: Skin is warm and dry.  Psychiatric: He has a normal mood and affect. His behavior is normal. Judgment and thought content normal.  Vitals reviewed. Dg Chest 2 View  Result Date: 02/15/2017 CLINICAL DATA:  Cough and congestion.  Hypertension. EXAM: CHEST  2 VIEW COMPARISON:  October 24, 2016 FINDINGS: There is no edema or consolidation. Heart size and pulmonary vascularity are within normal limits. No adenopathy. There is degenerative change in the thoracic spine. IMPRESSION: No edema or consolidation. Electronically Signed   By: Lowella Grip III M.D.   On: 02/15/2017 17:13   Assessment & Plan:  1. Essential Hypertension, uncontrolled. Patient has been off medication for more than 3 days. - EKG 12-Lead -Chest x-ray -unremarkable  Smoking cessation discussed. -Return tomorrow for blood pressure recheck -Tonight take one dose of Lisinopril (Zestril) 20 mg, 40 mg daily and repeat Lisinopril 40 mg and take hydrochlorothiazide 25 mg once daily. -Return tomorrow for blood pressure recheck.  2. Sinusitis  -Start Azithromycin Take 2 tabs x 1 dose, then 1 tab every day for x 4 days.  3. Type 2 Diabetes Mellitus , A1C 6.7, newly diagnosed  -Metformin 500 mg twice daily with food daily   Return tomorrow for blood pressure recheck.   Carroll Sage. Kenton Kingfisher, MSN, FNP-C [facility]

## 2017-02-15 NOTE — Patient Instructions (Addendum)
Diabetes Your A1C is 6.7 which means you have Diabetes Start Metformin 500 mg twice daily with meals (full meals) to prevent abdominal discomfort.  Hypertension I have refilled both of your antihypertension medications Lisinopril and hydrochlorothiazide-Please start taking today   Acute Bronchitis  Start Azithromycin Take 2 tabs x 1 dose, then 1 tab every day for x 4 days. Complete all medications.  IF you received an x-ray today, you will receive an invoice from [facility]. Please contact [facility] at [phone] with questions or concerns regarding your invoice.   IF you received labwork today, you will receive an invoice from [place]. Please contact [facility] at [phone] with questions or concerns regarding your invoice.   Our billing staff will not be able to assist you with questions regarding bills from these companies.  You will be contacted with the lab results as soon as they are available. The fastest way to get your results is to activate your My Chart account. Instructions are located on the last page of this paperwork. If you have not heard from [place] regarding the results in 2 weeks, please contact this office.     Hypertension Hypertension is another name for high blood pressure. High blood pressure forces your heart to work harder to pump blood. This can cause problems over time. There are two numbers in a blood pressure reading. There is a top number (systolic) over a bottom number (diastolic). It is best to have a blood pressure below 120/80. Healthy choices can help lower your blood pressure. You may need medicine to help lower your blood pressure if:  Your blood pressure cannot be lowered with healthy choices.  Your blood pressure is higher than 130/80. Follow these instructions at home: Eating and drinking   If directed, follow the DASH eating plan. This diet includes:  Filling half of your plate at each meal with fruits and  vegetables.  Filling one quarter of your plate at each meal with whole grains. Whole grains include whole wheat pasta, brown rice, and whole grain bread.  Eating or drinking low-fat dairy products, such as skim milk or low-fat yogurt.  Filling one quarter of your plate at each meal with low-fat (lean) proteins. Low-fat proteins include fish, skinless chicken, eggs, beans, and tofu.  Avoiding fatty meat, cured and processed meat, or chicken with skin.  Avoiding premade or processed food.  Eat less than 1,500 mg of salt (sodium) a day.  Limit alcohol use to no more than 1 drink a day for nonpregnant women and 2 drinks a day for men. One drink equals 12 oz of beer, 5 oz of wine, or 1 oz of hard liquor. Lifestyle   Work with your doctor to stay at a healthy weight or to lose weight. Ask your doctor what the best weight is for you.  Get at least 30 minutes of exercise that causes your heart to beat faster (aerobic exercise) most days of the week. This may include walking, swimming, or biking.  Get at least 30 minutes of exercise that strengthens your muscles (resistance exercise) at least 3 days a week. This may include lifting weights or pilates.  Do not use any products that contain nicotine or tobacco. This includes cigarettes and e-cigarettes. If you need help quitting, ask your doctor.  Check your blood pressure at home as told by your doctor.  Keep all follow-up visits as told by your doctor. This is important. Medicines   Take over-the-counter and prescription medicines only as told  by your doctor. Follow directions carefully.  Do not skip doses of blood pressure medicine. The medicine does not work as well if you skip doses. Skipping doses also puts you at risk for problems.  Ask your doctor about side effects or reactions to medicines that you should watch for. Contact a doctor if:  You think you are having a reaction to the medicine you are taking.  You have headaches  that keep coming back (recurring).  You feel dizzy.  You have swelling in your ankles.  You have trouble with your vision. Get help right away if:  You get a very bad headache.  You start to feel confused.  You feel weak or numb.  You feel faint.  You get very bad pain in your:  Chest.  Belly (abdomen).  You throw up (vomit) more than once.  You have trouble breathing. Summary  Hypertension is another name for high blood pressure.  Making healthy choices can help lower blood pressure. If your blood pressure cannot be controlled with healthy choices, you may need to take medicine. This information is not intended to replace advice given to you by your health care provider. Make sure you discuss any questions you have with your health care provider. Document Released: 05/23/2008 Document Revised: 11/02/2016 Document Reviewed: 11/02/2016 Elsevier Interactive Patient Education  2017 Elsevier Inc.  Type 2 Diabetes Mellitus, Diagnosis, Adult Type 2 diabetes (type 2 diabetes mellitus) is a long-term (chronic) disease. It may be caused by one or both of these problems:  Your body does not make enough of a hormone called insulin.  Your body does not react in a normal way to insulin that it makes. Insulin lets sugars (glucose) go into cells in the body. This gives you energy. If you have type 2 diabetes, sugars cannot get into cells. This causes high blood sugar (hyperglycemia). Your doctor will set treatment goals for you. Generally, you should have these blood sugar levels:  Before meals (preprandial): 80-130 mg/dL (2.4-0.9 mmol/L).  After meals (postprandial): below 180 mg/dL (10 mmol/L).  A1c (hemoglobin A1c) level: less than 7%. Follow these instructions at home: Questions to Ask Your Doctor   You may want to ask these questions:  Do I need to meet with a diabetes educator?  Where can I find a support group for people with diabetes?  What equipment will I need to  care for myself at home?  What diabetes medicines do I need? When should I take them?  How often do I need to check my blood sugar?  What number can I call if I have questions?  When is my next doctor's visit? General instructions   Take over-the-counter and prescription medicines only as told by your doctor.  Keep all follow-up visits as told by your doctor. This is important. Contact a doctor if:  Your blood sugar is at or above 240 mg/dL (73.5 mmol/L) for 2 days in a row.  You have been sick or have had a fever for 2 days or more and you are not getting better.  You have any of these problems for more than 6 hours:  You cannot eat or drink.  You feel sick to your stomach (nauseous).  You throw up (vomit).  You have watery poop (diarrhea). Get help right away if:  Your blood sugar is lower than 54 mg/dL (3 mmol/L).  You get confused.  You have trouble:  Thinking clearly.  Breathing.  You have moderate or large ketone levels  in your pee (urine). This information is not intended to replace advice given to you by your health care provider. Make sure you discuss any questions you have with your health care provider. Document Released: 09/13/2008 Document Revised: 05/12/2016 Document Reviewed: 01/08/2016 Elsevier Interactive Patient Education  2017 [employer].   Steps to Quit Smoking Smoking tobacco can be bad for your health. It can also affect almost every organ in your body. Smoking puts you and people around you at risk for many serious long-lasting (chronic) diseases. Quitting smoking is hard, but it is one of the best things that you can do for your health. It is never too late to quit. What are the benefits of quitting smoking? When you quit smoking, you lower your risk for getting serious diseases and conditions. They can include:  Lung cancer or lung disease.  Heart disease.  Stroke.  Heart attack.  Not being able to have children  (infertility).  Weak bones (osteoporosis) and broken bones (fractures). If you have coughing, wheezing, and shortness of breath, those symptoms may get better when you quit. You may also get sick less often. If you are pregnant, quitting smoking can help to lower your chances of having a baby of low birth weight. What can I do to help me quit smoking? Talk with your doctor about what can help you quit smoking. Some things you can do (strategies) include:  Quitting smoking totally, instead of slowly cutting back how much you smoke over a period of time.  Going to in-person counseling. You are more likely to quit if you go to many counseling sessions.  Using resources and support systems, such as:  Online chats with a Veterinary surgeon.  Phone quitlines.  Printed Materials engineer.  Support groups or group counseling.  Text messaging programs.  Mobile phone apps or applications.  Taking medicines. Some of these medicines may have nicotine in them. If you are pregnant or breastfeeding, do not take any medicines to quit smoking unless your doctor says it is okay. Talk with your doctor about counseling or other things that can help you. Talk with your doctor about using more than one strategy at the same time, such as taking medicines while you are also going to in-person counseling. This can help make quitting easier. What things can I do to make it easier to quit? Quitting smoking might feel very hard at first, but there is a lot that you can do to make it easier. Take these steps:  Talk to your family and friends. Ask them to support and encourage you.  Call phone quitlines, reach out to support groups, or work with a Veterinary surgeon.  Ask people who smoke to not smoke around you.  Avoid places that make you want (trigger) to smoke, such as:  Bars.  Parties.  Smoke-break areas at work.  Spend time with people who do not smoke.  Lower the stress in your life. Stress can make you want  to smoke. Try these things to help your stress:  Getting regular exercise.  Deep-breathing exercises.  Yoga.  Meditating.  Doing a body scan. To do this, close your eyes, focus on one area of your body at a time from head to toe, and notice which parts of your body are tense. Try to relax the muscles in those areas.  Download or buy apps on your mobile phone or tablet that can help you stick to your quit plan. There are many free apps, such as QuitGuide from the  CDC (Centers for Disease Control and Prevention). You can find more support from smokefree.gov and other websites. This information is not intended to replace advice given to you by your health care provider. Make sure you discuss any questions you have with your health care provider. Document Released: 10/01/2009 Document Revised: 08/02/2016 Document Reviewed: 04/21/2015 Elsevier Interactive Patient Education  2017 [employer].

## 2017-02-16 ENCOUNTER — Encounter: Payer: Self-pay | Admitting: Family Medicine

## 2017-02-16 ENCOUNTER — Ambulatory Visit (INDEPENDENT_AMBULATORY_CARE_PROVIDER_SITE_OTHER): Payer: BC Managed Care – PPO | Admitting: Family Medicine

## 2017-02-16 VITALS — BP 158/100

## 2017-02-16 DIAGNOSIS — I10 Essential (primary) hypertension: Secondary | ICD-10-CM | POA: Diagnosis not present

## 2017-02-16 MED ORDER — HYDROCOD POLST-CPM POLST ER 10-8 MG/5ML PO SUER: 5.0000 mL | Freq: Two times a day (BID) | ORAL | 0 refills | Status: DC | PRN

## 2017-02-16 NOTE — Progress Notes (Deleted)
Patient ID: Thomas Yoder, male    DOB: 1961-02-08, 56 y.o.   MRN: [id number]  PCP: Jenny Reichmann, MD  No chief complaint on file.   Subjective:  HPI  56 year old male presents for elevated blood pressure follow-up. He reports that he took  Feel better with the exception of cough Took metformin and it is causing him frequent stools, but he    Social History   Social History  . Marital status: Single    Spouse name: N/A  . Number of children: N/A  . Years of education: N/A   Occupational History  . Not on file.   Social History Main Topics  . Smoking status: Current Every Day Smoker    Packs/day: 0.30    Years: 3.00    Types: Cigarettes  . Smokeless tobacco: Never Used  . Alcohol use No  . Drug use: No  . Sexual activity: Not on file   Other Topics Concern  . Not on file   Social History Narrative  . No narrative on file    Family History  Problem Relation Age of Onset  . Hypertension Mother   . Hypertension Father    Review of Systems   See HPI  Patient Active Problem List   Diagnosis Date Noted  . Primary osteoarthritis of left knee 11/01/2016  . Tobacco use disorder 09/18/2014  . Essential hypertension, benign 08/21/2012  . BMI 34.0-34.9,adult 08/21/2012  . Knee osteoarthritis 08/21/2012    No Known Allergies  Prior to Admission medications   Medication Sig Start Date End Date Taking? Authorizing Provider  aspirin EC 325 MG EC tablet Take 1 tablet (325 mg total) by mouth 2 (two) times daily after a meal. 11/03/16   Loni Dolly, PA-C  azithromycin (ZITHROMAX) 250 MG tablet Take 2 tabs PO x 1 dose, then 1 tab PO QD x 4 days 02/15/17   Sedalia Muta, FNP  Blood Pressure Monitoring (ADULT BLOOD PRESSURE CUFF LG) KIT Check blood pressure daily and record readings. 10/05/16   [name], MD  chlorpheniramine-HYDROcodone (TUSSIONEX PENNKINETIC ER) 10-8 MG/5ML SUER Take 5 mLs by mouth every 12 (twelve) hours as needed for cough. 02/16/17    Sedalia Muta, FNP  escitalopram (LEXAPRO) 10 MG tablet Take 1 tablet (10 mg total) by mouth daily. 01/23/16   Jaynee Eagles, PA-C  hydrochlorothiazide (HYDRODIURIL) 12.5 MG tablet Take 2 tablets (25 mg total) by mouth daily. 02/15/17   Sedalia Muta, FNP  HYDROcodone-acetaminophen (NORCO) 7.5-325 MG tablet Take 1-2 tablets by mouth every 4 (four) hours as needed for moderate pain or severe pain. 11/03/16   Loni Dolly, PA-C  lisinopril (PRINIVIL,ZESTRIL) 20 MG tablet Take 2 tablets (40 mg total) by mouth daily. 02/15/17   Sedalia Muta, FNP  LORazepam (ATIVAN) 0.5 MG tablet Take 1 tablet (0.5 mg total) by mouth daily as needed for anxiety. 01/06/16   Jaynee Eagles, PA-C  metFORMIN (GLUCOPHAGE) 500 MG tablet Take 1 tablet (500 mg total) by mouth 2 (two) times daily with a meal. 02/15/17   Sedalia Muta, FNP  methocarbamol (ROBAXIN) 750 MG tablet Take 1 tablet (750 mg total) by mouth every 6 (six) hours as needed for muscle spasms. 11/03/16   Loni Dolly, PA-C    Past Medical, Surgical Family and Social History reviewed and updated.    Objective:   Today's Vitals   02/16/17 1615  BP: (!) 158/100    Wt Readings from Last 3 Encounters:  02/15/17  230 lb 6.4 oz (104.5 kg)  11/01/16 232 lb (105.2 kg)  10/24/16 232 lb 12.8 oz (105.6 kg)    Physical Exam         Assessment & Plan:  1. Hypertension, unspecified type    Carroll Sage. Kenton Kingfisher, MSN, FNP-C [facility] at [place]

## 2017-02-16 NOTE — Patient Instructions (Addendum)
For cough, chlorpheniramine-hydrocodone (Tussionex) 5 ml every 12 hours. Medication causes drowsiness and or sedation.   Return for follow-up in three months.   IF you received an x-ray today, you will receive an invoice from [facility]. Please contact [facility] at [phone] with questions or concerns regarding your invoice.   IF you received labwork today, you will receive an invoice from [facility]. Please contact [facility] at [phone] with questions or concerns regarding your invoice.   Our billing staff will not be able to assist you with questions regarding bills from these companies.  You will be contacted with the lab results as soon as they are available. The fastest way to get your results is to activate your My Chart account. Instructions are located on the last page of this paperwork. If you have not heard from [place] regarding the results in 2 weeks, please contact this office.

## 2017-02-23 NOTE — Progress Notes (Signed)
     Blood Pressure Recheck Only -Nurse visit   Today's Vitals   02/16/17 1615  BP: (!) 158/100   -Blood pressure improved since yesterday. -Patient advised to continue medication as prescribed. -Return follow in 3 months for hypertension. -Gave prescription for cough from yesterday-chlorpheniramine-hydrocodone (Tussionex) 5 ml every 12 hours.  Godfrey Pick. Tiburcio Pea, MSN, FNP-C Primary Care at [facility] [phone]

## 2018-03-14 ENCOUNTER — Emergency Department (HOSPITAL_COMMUNITY): Payer: BC Managed Care – PPO

## 2018-03-14 ENCOUNTER — Emergency Department (HOSPITAL_COMMUNITY)
Admission: EM | Admit: 2018-03-14 | Discharge: 2018-03-14 | Disposition: A | Discharge status: Home / Self Care | Payer: BC Managed Care – PPO | Attending: Emergency Medicine | Admitting: Emergency Medicine

## 2018-03-14 ENCOUNTER — Encounter (HOSPITAL_COMMUNITY): Payer: Self-pay

## 2018-03-14 DIAGNOSIS — J111 Influenza due to unidentified influenza virus with other respiratory manifestations: Secondary | ICD-10-CM | POA: Diagnosis not present

## 2018-03-14 DIAGNOSIS — Z96652 Presence of left artificial knee joint: Secondary | ICD-10-CM | POA: Diagnosis not present

## 2018-03-14 DIAGNOSIS — I10 Essential (primary) hypertension: Secondary | ICD-10-CM | POA: Diagnosis not present

## 2018-03-14 DIAGNOSIS — F17210 Nicotine dependence, cigarettes, uncomplicated: Secondary | ICD-10-CM | POA: Diagnosis not present

## 2018-03-14 DIAGNOSIS — Z79899 Other long term (current) drug therapy: Secondary | ICD-10-CM | POA: Insufficient documentation

## 2018-03-14 DIAGNOSIS — R05 Cough: Secondary | ICD-10-CM | POA: Diagnosis present

## 2018-03-14 DIAGNOSIS — Z7982 Long term (current) use of aspirin: Secondary | ICD-10-CM | POA: Insufficient documentation

## 2018-03-14 DIAGNOSIS — R69 Illness, unspecified: Secondary | ICD-10-CM

## 2018-03-14 MED ORDER — OSELTAMIVIR PHOSPHATE 75 MG PO CAPS
75.0000 mg | ORAL_CAPSULE | Freq: Once | ORAL | Status: AC
  Administered 2018-03-14: 75 mg via ORAL
  Filled 2018-03-14: qty 1

## 2018-03-14 MED ORDER — OSELTAMIVIR PHOSPHATE 75 MG PO CAPS: 75.0000 mg | ORAL_CAPSULE | Freq: Two times a day (BID) | ORAL | 0 refills | Status: DC

## 2018-03-14 MED ORDER — ALBUTEROL SULFATE HFA 108 (90 BASE) MCG/ACT IN AERS
1.0000 | INHALATION_SPRAY | Freq: Four times a day (QID) | RESPIRATORY_TRACT | Status: DC
  Administered 2018-03-14: 2 via RESPIRATORY_TRACT
  Filled 2018-03-14: qty 6.7

## 2018-03-14 MED ORDER — IPRATROPIUM-ALBUTEROL 0.5-2.5 (3) MG/3ML IN SOLN
3.0000 mL | Freq: Once | RESPIRATORY_TRACT | Status: AC
  Administered 2018-03-14: 3 mL via RESPIRATORY_TRACT
  Filled 2018-03-14: qty 3

## 2018-03-14 MED ORDER — ACETAMINOPHEN 325 MG PO TABS
650.0000 mg | ORAL_TABLET | Freq: Once | ORAL | Status: AC | PRN
  Administered 2018-03-14: 650 mg via ORAL
  Filled 2018-03-14: qty 2

## 2018-03-14 NOTE — ED Notes (Signed)
Triage RN notified of vitals for meds

## 2018-03-14 NOTE — ED Triage Notes (Addendum)
Patient complains of 2 days of cough and congestion with chills. Alert and oriented, smoker. No CP, states that he is not taking his BP as he should

## 2018-03-14 NOTE — Discharge Instructions (Addendum)
Please read attached information. If you experience any new or worsening signs or symptoms please return to the emergency room for evaluation. Please follow-up with your primary care provider or specialist as discussed. Please use medication prescribed only as directed and discontinue taking if you have any concerning signs or symptoms.   °

## 2018-03-14 NOTE — ED Provider Notes (Signed)
[facility] EMERGENCY DEPARTMENT Provider Note   CSN: [id number] Arrival date & time: 03/14/18  1305     History   Chief Complaint Chief Complaint  Patient presents with  . Cough    HPI Thomas Yoder is a 57 y.o. male.  HPI   57 year old male presents today with complaints of upper respiratory infection.  Patient notes 3 days of rhinorrhea, nasal congestion and cough.  Patient reports diffuse body aches.  He reports he did not receive the influenza vaccine.  Patient has a history of hypertension and smoking, denies any history of asthma or COPD.  Patient reports some minimal shortness of breath, nonsevere.  He denies any other infectious etiology here today.  No medications prior to arrival.  Past Medical History:  Diagnosis Date  . Depression   . Hypertension     Patient Active Problem List   Diagnosis Date Noted  . Primary osteoarthritis of left knee 11/01/2016  . Tobacco use disorder 09/18/2014  . Essential hypertension, benign 08/21/2012  . BMI 34.0-34.9,adult 08/21/2012  . Knee osteoarthritis 08/21/2012    Past Surgical History:  Procedure Laterality Date  . KNEE SURGERY    . 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 Authorizing Provider  aspirin EC 325 MG EC tablet Take 1 tablet (325 mg total) by mouth 2 (two) times daily after a meal. 11/03/16   Loni Dolly, PA-C  azithromycin (ZITHROMAX) 250 MG tablet Take 2 tabs PO x 1 dose, then 1 tab PO QD x 4 days 02/15/17   Scot Jun, FNP  Blood Pressure Monitoring (ADULT BLOOD PRESSURE CUFF LG) KIT Check blood pressure daily and record readings. 10/05/16   [name], MD  chlorpheniramine-HYDROcodone (TUSSIONEX PENNKINETIC ER) 10-8 MG/5ML SUER Take 5 mLs by  mouth every 12 (twelve) hours as needed for cough. 02/16/17   Scot Jun, FNP  escitalopram (LEXAPRO) 10 MG tablet Take 1 tablet (10 mg total) by mouth daily. 01/23/16   Jaynee Eagles, PA-C  hydrochlorothiazide (HYDRODIURIL) 12.5 MG tablet Take 2 tablets (25 mg total) by mouth daily. 02/15/17   Scot Jun, FNP  HYDROcodone-acetaminophen (NORCO) 7.5-325 MG tablet Take 1-2 tablets by mouth every 4 (four) hours as needed for moderate pain or severe pain. 11/03/16   Loni Dolly, PA-C  lisinopril (PRINIVIL,ZESTRIL) 20 MG tablet Take 2 tablets (40 mg total) by mouth daily. 02/15/17   Scot Jun, FNP  LORazepam (ATIVAN) 0.5 MG tablet Take 1 tablet (0.5 mg total) by mouth daily as needed for anxiety. 01/06/16   Jaynee Eagles, PA-C  metFORMIN (GLUCOPHAGE) 500 MG tablet Take 1 tablet (500 mg total) by mouth 2 (two) times daily with a meal. 02/15/17   Scot Jun, FNP  methocarbamol (ROBAXIN) 750 MG tablet Take 1 tablet (750 mg total) by mouth every 6 (six) hours as needed for muscle spasms. 11/03/16   Loni Dolly, PA-C  oseltamivir (TAMIFLU) 75 MG capsule Take 1 capsule (75 mg total) by mouth every 12 (twelve) hours. 03/14/18   Natisha Trzcinski, Dellis Filbert, PA-C  lisinopril-hydrochlorothiazide (PRINZIDE,ZESTORETIC) 10-12.5 MG per tablet Take 1 tablet by mouth daily. 02/12/14 11/03/15  Darlyne Russian, MD  metoprolol succinate (TOPROL-XL) 50 MG 24 hr tablet Take 1 tablet (50 mg  total) by mouth daily. Take with or immediately following a meal. Patient not taking: Reported on 11/03/2015 02/12/14 11/03/15  Darlyne Russian, MD    Family History Family History  Problem Relation Age of Onset  . Hypertension Mother   . Hypertension Father     Social History Social History   Tobacco Use  . Smoking status: Current Every Day Smoker    Packs/day: 0.30    Years: 3.00    Pack years: 0.90    Types: Cigarettes  . Smokeless tobacco: Never Used  Substance Use Topics  . Alcohol use: No  . Drug use: No      Allergies   Patient has no known allergies.   Review of Systems Review of Systems  All other systems reviewed and are negative.    Physical Exam Updated Vital Signs BP 137/74 (BP Location: Right Arm)   Pulse 86   Temp (!) 102.2 F (39 C) (Oral)   Resp 20   SpO2 98%   Physical Exam  Constitutional: He is oriented to person, place, and time. He appears well-developed and well-nourished.  HENT:  Head: Normocephalic and atraumatic.  Right Ear: Hearing and tympanic membrane normal.  Left Ear: Hearing and tympanic membrane normal.  Eyes: Pupils are equal, round, and reactive to light. Conjunctivae are normal. Right eye exhibits no discharge. Left eye exhibits no discharge. No scleral icterus.  Neck: Normal range of motion. No JVD present. No tracheal deviation present.  Pulmonary/Chest: Effort normal and breath sounds normal. No stridor. No respiratory distress. He has no wheezes. He has no rales. He exhibits no tenderness.  Neurological: He is alert and oriented to person, place, and time. Coordination normal.  Psychiatric: He has a normal mood and affect. His behavior is normal. Judgment and thought content normal.  Nursing note and vitals reviewed.    ED Treatments / Results  Labs (all labs ordered are listed, but only abnormal results are displayed) Labs Reviewed - No data to display  EKG None  Radiology Dg Chest 2 View  Result Date: 03/14/2018 CLINICAL DATA:  Left flank pain and left chest pain. Shortness of breath, cough and congestion. EXAM: CHEST - 2 VIEW COMPARISON:  02/15/2017 and 10/24/2016. FINDINGS: Trachea is midline. Heart is enlarged, stable. Mild right hilar prominence, unchanged. Linear subsegmental atelectasis or scarring in the lung bases, left greater than right. Lungs are otherwise clear. Image quality on the lateral view is degraded by motion. IMPRESSION: No acute findings. Electronically Signed   By: Lorin Picket M.D.   On: 03/14/2018 14:03     Procedures Procedures (including critical care time)  Medications Ordered in ED Medications  albuterol (PROVENTIL HFA;VENTOLIN HFA) 108 (90 Base) MCG/ACT inhaler 1-2 puff (2 puffs Inhalation Given 03/14/18 1912)  acetaminophen (TYLENOL) tablet 650 mg (650 mg Oral Given 03/14/18 1654)  ipratropium-albuterol (DUONEB) 0.5-2.5 (3) MG/3ML nebulizer solution 3 mL (3 mLs Nebulization Given 03/14/18 1913)  oseltamivir (TAMIFLU) capsule 75 mg (75 mg Oral Given 03/14/18 1912)     Initial Impression / Assessment and Plan / ED Course  I have reviewed the triage vital signs and the nursing notes.  Pertinent labs & imaging results that were available during my care of the patient were reviewed by me and considered in my medical decision making (see chart for details).     Final Clinical Impressions(s) / ED Diagnoses   Final diagnoses:  Influenza-like illness    Labs:   Imaging: DG chest 2 view  Consults:  Therapeutics:  Tamiflu, DuoNeb  Discharge Meds: Tamiflu, albuterol  Assessment/Plan: 57 year old male presents today with likely influenza.  Patient is febrile with body aches, he is well-appearing.  He has clear lung sounds reassuring chest x-ray with no acute abnormalities.  Patient having no significant risk factors other than smoking.  Patient was given a breathing treatment here as he felt tightness.  Patient discharged home with albuterol as needed, Tamiflu and strict return precautions.  Patient verbalized understanding and agreement to today's plan had no further questions or concerns      ED Discharge Orders        Ordered    oseltamivir (TAMIFLU) 75 MG capsule  Every 12 hours     03/14/18 1955       Okey Regal, PA-C 03/14/18 2011    Little, Wenda Overland, MD 03/15/18 [phone]

## 2018-10-18 ENCOUNTER — Encounter: Payer: Self-pay | Admitting: Physician Assistant

## 2018-10-18 ENCOUNTER — Ambulatory Visit: Payer: BC Managed Care – PPO | Admitting: Physician Assistant

## 2018-10-18 ENCOUNTER — Other Ambulatory Visit: Payer: Self-pay

## 2018-10-18 VITALS — BP 150/100 | HR 75 | Temp 98.0°F | Resp 20 | Ht 69.09 in | Wt 245.0 lb

## 2018-10-18 DIAGNOSIS — E118 Type 2 diabetes mellitus with unspecified complications: Secondary | ICD-10-CM | POA: Diagnosis not present

## 2018-10-18 DIAGNOSIS — Z1211 Encounter for screening for malignant neoplasm of colon: Secondary | ICD-10-CM | POA: Diagnosis not present

## 2018-10-18 DIAGNOSIS — Z6836 Body mass index (BMI) 36.0-36.9, adult: Secondary | ICD-10-CM

## 2018-10-18 DIAGNOSIS — Z114 Encounter for screening for human immunodeficiency virus [HIV]: Secondary | ICD-10-CM

## 2018-10-18 DIAGNOSIS — I10 Essential (primary) hypertension: Secondary | ICD-10-CM | POA: Diagnosis not present

## 2018-10-18 LAB — POCT URINALYSIS DIP (MANUAL ENTRY)
BILIRUBIN UA: NEGATIVE
BILIRUBIN UA: NEGATIVE mg/dL
Blood, UA: NEGATIVE
Glucose, UA: NEGATIVE mg/dL
LEUKOCYTES UA: NEGATIVE
Nitrite, UA: NEGATIVE
Spec Grav, UA: 1.025 (ref 1.010–1.025)
UROBILINOGEN UA: 1 U/dL
pH, UA: 5.5 (ref 5.0–8.0)

## 2018-10-18 LAB — POCT GLYCOSYLATED HEMOGLOBIN (HGB A1C): Hemoglobin A1C: 6.5 % — AB (ref 4.0–5.6)

## 2018-10-18 MED ORDER — LISINOPRIL 20 MG PO TABS: 20.0000 mg | ORAL_TABLET | Freq: Every day | ORAL | 0 refills | Status: DC

## 2018-10-18 MED ORDER — HYDROCHLOROTHIAZIDE 12.5 MG PO TABS: 12.5000 mg | ORAL_TABLET | Freq: Every day | ORAL | 0 refills | Status: DC

## 2018-10-18 MED ORDER — METFORMIN HCL 500 MG PO TABS: 500.0000 mg | ORAL_TABLET | Freq: Every day | ORAL | 0 refills | Status: DC

## 2018-10-18 NOTE — Patient Instructions (Addendum)
In terms of elevated blood pressure, start HCTZ 12.5mg  and lisinopril 20mg  daily. I would like you to check your blood pressure at least a couple times over the next week outside of the office and document these values. It is best if you check the blood pressure at different times in the day. Your goal is <140/90.Return in 2 weeks for reevaluation. If you start to have chest pain, blurred vision, shortness of breath, severe headache, lower leg swelling, or nausea/vomiting please seek care immediately here or at the ED.   For diabetes, take metformin 500mg  daily. I suggest changing your diet because if you make healthier options it is likely you will not need medication. I have placed a referral to nutritionist and they should contact you within 2 weeks.   Thank you for letting me participate in your health and well being.   If you have lab work done today you will be contacted with your lab results within the next 2 weeks.  If you have not heard from [place] then please contact us. The fastest way to get your results is to register for My Chart.   Diabetes Mellitus and Nutrition When you have diabetes (diabetes mellitus), it is very important to have healthy eating habits because your blood sugar (glucose) levels are greatly affected by what you eat and drink. Eating healthy foods in the appropriate amounts, at about the same times every day, can help you:  Control your blood glucose.  Lower your risk of heart disease.  Improve your blood pressure.  Reach or maintain a healthy weight.  Every person with diabetes is different, and each person has different needs for a meal plan. Your health care provider may recommend that you work with a diet and nutrition specialist (dietitian) to make a meal plan that is best for you. Your meal plan may vary depending on factors such as:  The calories you need.  The medicines you take.  Your weight.  Your blood glucose, blood pressure, and cholesterol  levels.  Your activity level.  Other health conditions you have, such as heart or kidney disease.  How do carbohydrates affect me? Carbohydrates affect your blood glucose level more than any other type of food. Eating carbohydrates naturally increases the amount of glucose in your blood. Carbohydrate counting is a method for keeping track of how many carbohydrates you eat. Counting carbohydrates is important to keep your blood glucose at a healthy level, especially if you use insulin or take certain oral diabetes medicines. It is important to know how many carbohydrates you can safely have in each meal. This is different for every person. Your dietitian can help you calculate how many carbohydrates you should have at each meal and for snack. Foods that contain carbohydrates include:  Bread, cereal, rice, pasta, and crackers.  Potatoes and corn.  Peas, beans, and lentils.  Milk and yogurt.  Fruit and juice.  Desserts, such as cakes, cookies, ice cream, and candy.  How does alcohol affect me? Alcohol can cause a sudden decrease in blood glucose (hypoglycemia), especially if you use insulin or take certain oral diabetes medicines. Hypoglycemia can be a life-threatening condition. Symptoms of hypoglycemia (sleepiness, dizziness, and confusion) are similar to symptoms of having too much alcohol. If your health care provider says that alcohol is safe for you, follow these guidelines:  Limit alcohol intake to no more than 1 drink per day for nonpregnant women and 2 drinks per day for men. One drink equals 12 oz  of beer, 5 oz of wine, or 1 oz of hard liquor.  Do not drink on an empty stomach.  Keep yourself hydrated with water, diet soda, or unsweetened iced tea.  Keep in mind that regular soda, juice, and other mixers may contain a lot of sugar and must be counted as carbohydrates.  What are tips for following this plan? Reading food labels  Start by checking the serving size on the  label. The amount of calories, carbohydrates, fats, and other nutrients listed on the label are based on one serving of the food. Many foods contain more than one serving per package.  Check the total grams (g) of carbohydrates in one serving. You can calculate the number of servings of carbohydrates in one serving by dividing the total carbohydrates by 15. For example, if a food has 30 g of total carbohydrates, it would be equal to 2 servings of carbohydrates.  Check the number of grams (g) of saturated and trans fats in one serving. Choose foods that have low or no amount of these fats.  Check the number of milligrams (mg) of sodium in one serving. Most people should limit total sodium intake to less than 2,300 mg per day.  Always check the nutrition information of foods labeled as "low-fat" or "nonfat". These foods may be higher in added sugar or refined carbohydrates and should be avoided.  Talk to your dietitian to identify your daily goals for nutrients listed on the label. Shopping  Avoid buying canned, premade, or processed foods. These foods tend to be high in fat, sodium, and added sugar.  Shop around the outside edge of the grocery store. This includes fresh fruits and vegetables, bulk grains, fresh meats, and fresh dairy. Cooking  Use low-heat cooking methods, such as baking, instead of high-heat cooking methods like deep frying.  Cook using healthy oils, such as olive, canola, or sunflower oil.  Avoid cooking with butter, cream, or high-fat meats. Meal planning  Eat meals and snacks regularly, preferably at the same times every day. Avoid going long periods of time without eating.  Eat foods high in fiber, such as fresh fruits, vegetables, beans, and whole grains. Talk to your dietitian about how many servings of carbohydrates you can eat at each meal.  Eat 4-6 ounces of lean protein each day, such as lean meat, chicken, fish, eggs, or tofu. 1 ounce is equal to 1 ounce of  meat, chicken, or fish, 1 egg, or 1/4 cup of tofu.  Eat some foods each day that contain healthy fats, such as avocado, nuts, seeds, and fish. Lifestyle   Check your blood glucose regularly.  Exercise at least 30 minutes 5 or more days each week, or as told by your health care provider.  Take medicines as told by your health care provider.  Do not use any products that contain nicotine or tobacco, such as cigarettes and e-cigarettes. If you need help quitting, ask your health care provider.  Work with a Veterinary surgeon or diabetes educator to identify strategies to manage stress and any emotional and social challenges. What are some questions to ask my health care provider?  Do I need to meet with a diabetes educator?  Do I need to meet with a dietitian?  What number can I call if I have questions?  When are the best times to check my blood glucose? Where to find more information:  American Diabetes Association: diabetes.org/food-and-fitness/food  Academy of Nutrition and Dietetics: https://www.vargas.com/  [employer] of Diabetes  and Digestive and Kidney Diseases (NIH): FindJewelers.cz Summary  A healthy meal plan will help you control your blood glucose and maintain a healthy lifestyle.  Working with a diet and nutrition specialist (dietitian) can help you make a meal plan that is best for you.  Keep in mind that carbohydrates and alcohol have immediate effects on your blood glucose levels. It is important to count carbohydrates and to use alcohol carefully. This information is not intended to replace advice given to you by your health care provider. Make sure you discuss any questions you have with your health care provider. Document Released: 09/01/2005 Document Revised: 01/09/2017 Document Reviewed: 01/09/2017 Elsevier Interactive Patient Education  2018  [employer].  IF you received an x-ray today, you will receive an invoice from [facility]. Please contact [facility] at [phone] with questions or concerns regarding your invoice.   IF you received labwork today, you will receive an invoice from [facility]. Please contact [facility] at [phone] with questions or concerns regarding your invoice.   Our billing staff will not be able to assist you with questions regarding bills from these companies.  You will be contacted with the lab results as soon as they are available. The fastest way to get your results is to activate your My Chart account. Instructions are located on the last page of this paperwork. If you have not heard from [place] regarding the results in 2 weeks, please contact this office.

## 2018-10-18 NOTE — Progress Notes (Signed)
MRN: [id number]  Subjective:   Thomas Yoder is a 57 y.o. male who presents for med refills. Has been out of all meds for about 5 months.   -Type 2 diabetes mellitus: Diagnosis was made couple years ago. Patient is currently managed with none. Used to take metformin 558m BID. Denies adverse effects including metallic taste, hypoglycemia, nausea, vomiting.Patient is not checking home blood sugars. Home blood sugar records: patient does not check sugars. Current symptoms include none. Patient denies nausea, paresthesia of the feet, polydipsia, polyuria, visual disturbances, vomiting and weight loss. Patient is checking their feet daily. No foot concerns. Last diabetic eye exam eye exam: 3 weeks ago.Diet consists of junk food, no vegetables, drinks mostly water and gatorade. No structured exercise. Smokes 1 pack per week x 40 years. Known diabetic complications: none.Immunizations: Flu vaccine: never, shingles: never, pneumococal vaccine: never  -Hypertension: Dx 10 years ago. Out of lisinopril 441mand HCTZ 2568m Patient is not checking blood pressure at home.. Denies lightheadedness, dizziness, chronic headache, double vision, chest pain, shortness of breath, heart racing, palpitations, nausea, vomiting, abdominal pain, hematuria, lower leg swelling. Lifestyle: Avoiding excessive salt intake. Trying to exercise on a regular basis. Not much alcohol use.   Patient Active Problem List   Diagnosis Date Noted  . Primary osteoarthritis of left knee 11/01/2016  . Tobacco use disorder 09/18/2014  . Essential hypertension, benign 08/21/2012  . BMI 34.0-34.9,adult 08/21/2012  . Knee osteoarthritis 08/21/2012   Social History   Socioeconomic History  . Marital status: Single    Spouse name: Not on file  . Number of children: 3  . Years of education: Not on file  . Highest education level: Not on file  Occupational History  . Not on file  Social Needs  . Financial resource strain: Not on file   . Food insecurity:    Worry: Not on file    Inability: Not on file  . Transportation needs:    Medical: Not on file    Non-medical: Not on file  Tobacco Use  . Smoking status: Current Every Day Smoker    Packs/day: 2.00    Years: 3.00    Pack years: 6.00    Types: Cigarettes  . Smokeless tobacco: Never Used  Substance and Sexual Activity  . Alcohol use: No  . Drug use: No  . Sexual activity: Yes    Birth control/protection: Condom  Lifestyle  . Physical activity:    Days per week: Not on file    Minutes per session: Not on file  . Stress: Not on file  Relationships  . Social connections:    Talks on phone: Not on file    Gets together: Not on file    Attends religious service: Not on file    Active member of club or organization: Not on file    Attends meetings of clubs or organizations: Not on file    Relationship status: Not on file  . Intimate partner violence:    Fear of current or ex partner: Not on file    Emotionally abused: Not on file    Physically abused: Not on file    Forced sexual activity: Not on file  Other Topics Concern  . Not on file  Social History Narrative  . Not on file      Objective:   PHYSICAL EXAM BP (!) 150/100 (BP Location: Right Arm, Cuff Size: Large)   Pulse 75   Temp 98 F (36.7 C) (Oral)  Resp 20   Ht 5' 9.09" (1.755 m)   Wt 245 lb (111.1 kg)   SpO2 97%   BMI 36.08 kg/m   Physical Exam  Constitutional: He is oriented to person, place, and time. He appears well-developed and well-nourished. No distress.  HENT:  Head: Normocephalic and atraumatic.  Mouth/Throat: Uvula is midline, oropharynx is clear and moist and mucous membranes are normal. No tonsillar exudate.  Eyes: Pupils are equal, round, and reactive to light. Conjunctivae, EOM and lids are normal.  Neck: Normal range of motion.  Cardiovascular: Normal rate, regular rhythm, normal heart sounds and intact distal pulses.  Pulmonary/Chest: Effort normal and breath  sounds normal. He has no wheezes. He has no rales.  Abdominal: Soft. Normal appearance and bowel sounds are normal. There is no tenderness.  Musculoskeletal:       Right lower leg: He exhibits no edema.       Left lower leg: He exhibits no edema.  Neurological: He is alert and oriented to person, place, and time.  Skin: Skin is warm and dry.  Psychiatric: He has a normal mood and affect.  Vitals reviewed.   Diabetic Foot Exam - Simple   Simple Foot Form Visual Inspection See comments:  Yes Sensation Testing Intact to touch and monofilament testing bilaterally:  Yes Pulse Check Comments Callus on both feet      Results for orders placed or performed in visit on 10/18/18 (from the past 24 hour(s))  POCT urinalysis dipstick     Status: Abnormal   Collection Time: 10/18/18 12:20 PM  Result Value Ref Range   Color, UA yellow yellow   Clarity, UA clear clear   Glucose, UA negative negative mg/dL   Bilirubin, UA negative negative   Ketones, POC UA negative negative mg/dL   Spec Grav, UA 1.025 1.010 - 1.025   Blood, UA negative negative   pH, UA 5.5 5.0 - 8.0   Protein Ur, POC trace (A) negative mg/dL   Urobilinogen, UA 1.0 0.2 or 1.0 E.U./dL   Nitrite, UA Negative Negative   Leukocytes, UA Negative Negative  POCT glycosylated hemoglobin (Hb A1C)     Status: Abnormal   Collection Time: 10/18/18 12:22 PM  Result Value Ref Range   Hemoglobin A1C 6.5 (A) 4.0 - 5.6 %   HbA1c POC (<> result, manual entry)     HbA1c, POC (prediabetic range)     HbA1c, POC (controlled diabetic range)     Depression screen [facility] 2/9 10/18/2018 02/15/2017 10/05/2016 01/21/2016 01/06/2016  Decreased Interest 0 0 0 1 1  Down, Depressed, Hopeless 0 0 0 1 2  PHQ - 2 Score 0 0 0 2 3  Altered sleeping - - - 2 3  Tired, decreased energy - - - 3 3  Change in appetite - - - 2 3  Feeling bad or failure about yourself  - - - 2 3  Trouble concentrating - - - 1 3  Moving slowly or fidgety/restless - - - 0 3    Suicidal thoughts - - - 0 0  PHQ-9 Score - - - 12 21  Difficult doing work/chores - - - Extremely dIfficult -   No flowsheet data found.   Wt Readings from Last 3 Encounters:  10/18/18 245 lb (111.1 kg)  02/15/17 230 lb 6.4 oz (104.5 kg)  11/01/16 232 lb (105.2 kg)   BP Readings from Last 3 Encounters:  10/18/18 (!) 150/100  03/14/18 137/74  02/16/17 (!) 158/100  Assessment and Plan :  1. Essential hypertension Uncontrolled. Asx. Rec restarting meds at HCTZ 12.40m and lisinopril 272mdaily.  Recommend checking blood pressure outside the office.  Follow-up in 2 weeks for reevaluation.  Given strict ED precautions. - hydrochlorothiazide (HYDRODIURIL) 12.5 MG tablet; Take 1 tablet (12.5 mg total) by mouth daily.  Dispense: 90 tablet; Refill: 0 - lisinopril (PRINIVIL,ZESTRIL) 20 MG tablet; Take 1 tablet (20 mg total) by mouth daily.  Dispense: 90 tablet; Refill: 0  2. Type 2 diabetes mellitus with complication, without long-term current use of insulin (HCC) A1c 6.5.  Discussed lifestyle modifications versus starting metformin.  Patient reports that it is highly likely that he will improve his diet or exercise over the next 3 months.  Will consider going to diabetic nutritionist.  Referral placed.  Start metformin 500 mg daily.  Follow-up in 3 months for evaluation.  Declines Pneumovax. - CBC with Differential/Platelet - CMP14+EGFR - Lipid panel - TSH - POCT urinalysis dipstick - POCT glycosylated hemoglobin (Hb A1C) - Microalbumin/Creatinine Ratio, Urine - HM Diabetes Foot Exam - Ambulatory referral to diabetic education - metFORMIN (GLUCOPHAGE) 500 MG tablet; Take 1 tablet (500 mg total) by mouth daily with breakfast.  Dispense: 90 tablet; Refill: 0  3. Screen for colon cancer - Ambulatory referral to Gastroenterology  4. Screening for HIV (human immunodeficiency virus) - HIV Antibody (routine testing w rflx)  5. Class 2 severe obesity with serious comorbidity and body  mass index (BMI) of 36.0 to 36.9 in adult, unspecified obesity type ([facility]Had lengthy discussion with patient about lifestyle modifications to aid in weight loss.   BrTenna DelainePA-C  [facility] 10/18/2018 1:12 PM

## 2018-10-19 LAB — CMP14+EGFR
ALT: 16 IU/L (ref 0–44)
AST: 14 IU/L (ref 0–40)
Albumin/Globulin Ratio: 1.4 (ref 1.2–2.2)
Albumin: 3.9 g/dL (ref 3.5–5.5)
Alkaline Phosphatase: 116 IU/L (ref 39–117)
BUN/Creatinine Ratio: 13 (ref 9–20)
BUN: 13 mg/dL (ref 6–24)
Bilirubin Total: 0.2 mg/dL (ref 0.0–1.2)
CALCIUM: 9 mg/dL (ref 8.7–10.2)
CHLORIDE: 101 mmol/L (ref 96–106)
CO2: 21 mmol/L (ref 20–29)
Creatinine, Ser: 1.02 mg/dL (ref 0.76–1.27)
GFR, EST AFRICAN AMERICAN: 95 mL/min/{1.73_m2} (ref >59)
GFR, EST NON AFRICAN AMERICAN: 82 mL/min/{1.73_m2} (ref >59)
GLUCOSE: 159 mg/dL — AB (ref 65–99)
Globulin, Total: 2.8 g/dL (ref 1.5–4.5)
POTASSIUM: 4 mmol/L (ref 3.5–5.2)
Sodium: 140 mmol/L (ref 134–144)
TOTAL PROTEIN: 6.7 g/dL (ref 6.0–8.5)

## 2018-10-19 LAB — CBC WITH DIFFERENTIAL/PLATELET
BASOS ABS: 0.1 10*3/uL (ref 0.0–0.2)
BASOS: 1 %
EOS (ABSOLUTE): 0.1 10*3/uL (ref 0.0–0.4)
Eos: 3 %
Hematocrit: 46.2 % (ref 37.5–51.0)
Hemoglobin: 14.8 g/dL (ref 13.0–17.7)
IMMATURE GRANS (ABS): 0 10*3/uL (ref 0.0–0.1)
IMMATURE GRANULOCYTES: 0 %
LYMPHS: 37 %
Lymphocytes Absolute: 1.9 10*3/uL (ref 0.7–3.1)
MCH: 25.1 pg — ABNORMAL LOW (ref 26.6–33.0)
MCHC: 32 g/dL (ref 31.5–35.7)
MCV: 78 fL — ABNORMAL LOW (ref 79–97)
Monocytes Absolute: 0.2 10*3/uL (ref 0.1–0.9)
Monocytes: 5 %
NEUTROS PCT: 54 %
Neutrophils Absolute: 2.8 10*3/uL (ref 1.4–7.0)
PLATELETS: 209 10*3/uL (ref 150–450)
RBC: 5.9 x10E6/uL — AB (ref 4.14–5.80)
RDW: 16.5 % — ABNORMAL HIGH (ref 12.3–15.4)
WBC: 5.1 10*3/uL (ref 3.4–10.8)

## 2018-10-19 LAB — LIPID PANEL
CHOLESTEROL TOTAL: 199 mg/dL (ref 100–199)
Chol/HDL Ratio: 6.4 ratio — ABNORMAL HIGH (ref 0.0–5.0)
HDL: 31 mg/dL — AB (ref >39)
LDL Calculated: 105 mg/dL — ABNORMAL HIGH (ref 0–99)
Triglycerides: 317 mg/dL — ABNORMAL HIGH (ref 0–149)
VLDL CHOLESTEROL CAL: 63 mg/dL — AB (ref 5–40)

## 2018-10-19 LAB — MICROALBUMIN / CREATININE URINE RATIO
CREATININE, UR: 277.3 mg/dL
Microalb/Creat Ratio: 5.9 mg/g creat (ref 0.0–30.0)
Microalbumin, Urine: 16.3 ug/mL

## 2018-10-19 LAB — HIV ANTIBODY (ROUTINE TESTING W REFLEX): HIV SCREEN 4TH GENERATION: NONREACTIVE

## 2018-10-19 LAB — TSH: TSH: 1.35 u[IU]/mL (ref 0.450–4.500)

## 2018-10-23 ENCOUNTER — Other Ambulatory Visit: Payer: Self-pay | Admitting: Physician Assistant

## 2018-10-23 MED ORDER — ATORVASTATIN CALCIUM 20 MG PO TABS: 20.0000 mg | ORAL_TABLET | Freq: Every day | ORAL | 3 refills | Status: DC

## 2018-10-23 NOTE — Progress Notes (Signed)
Meds ordered this encounter  Medications  . atorvastatin (LIPITOR) 20 MG tablet    Sig: Take 1 tablet (20 mg total) by mouth daily.    Dispense:  90 tablet    Refill:  3    Order Specific Question:   Supervising Provider    Answer:   SAGARDIA, MIGUEL JOSE [[id number]]    

## 2018-10-26 ENCOUNTER — Encounter (HOSPITAL_COMMUNITY): Payer: Self-pay

## 2018-10-26 ENCOUNTER — Emergency Department (HOSPITAL_COMMUNITY): Payer: BC Managed Care – PPO

## 2018-10-26 ENCOUNTER — Emergency Department (HOSPITAL_COMMUNITY)
Admission: EM | Admit: 2018-10-26 | Discharge: 2018-10-26 | Disposition: A | Discharge status: Home / Self Care | Payer: BC Managed Care – PPO | Attending: Emergency Medicine | Admitting: Emergency Medicine

## 2018-10-26 ENCOUNTER — Other Ambulatory Visit: Payer: Self-pay

## 2018-10-26 DIAGNOSIS — Z7984 Long term (current) use of oral hypoglycemic drugs: Secondary | ICD-10-CM | POA: Insufficient documentation

## 2018-10-26 DIAGNOSIS — F17210 Nicotine dependence, cigarettes, uncomplicated: Secondary | ICD-10-CM | POA: Diagnosis not present

## 2018-10-26 DIAGNOSIS — Z96652 Presence of left artificial knee joint: Secondary | ICD-10-CM | POA: Diagnosis not present

## 2018-10-26 DIAGNOSIS — F329 Major depressive disorder, single episode, unspecified: Secondary | ICD-10-CM | POA: Insufficient documentation

## 2018-10-26 DIAGNOSIS — Z79899 Other long term (current) drug therapy: Secondary | ICD-10-CM | POA: Insufficient documentation

## 2018-10-26 DIAGNOSIS — I10 Essential (primary) hypertension: Secondary | ICD-10-CM | POA: Diagnosis not present

## 2018-10-26 DIAGNOSIS — R109 Unspecified abdominal pain: Secondary | ICD-10-CM | POA: Diagnosis present

## 2018-10-26 DIAGNOSIS — E119 Type 2 diabetes mellitus without complications: Secondary | ICD-10-CM | POA: Diagnosis not present

## 2018-10-26 DIAGNOSIS — R1011 Right upper quadrant pain: Secondary | ICD-10-CM | POA: Diagnosis not present

## 2018-10-26 LAB — CBC WITH DIFFERENTIAL/PLATELET
Abs Immature Granulocytes: 0.03 10*3/uL (ref 0.00–0.07)
BASOS PCT: 1 %
Basophils Absolute: 0.1 10*3/uL (ref 0.0–0.1)
EOS ABS: 0.2 10*3/uL (ref 0.0–0.5)
Eosinophils Relative: 3 %
HCT: 51.2 % (ref 39.0–52.0)
Hemoglobin: 15.3 g/dL (ref 13.0–17.0)
Immature Granulocytes: 1 %
Lymphocytes Relative: 41 %
Lymphs Abs: 2.5 10*3/uL (ref 0.7–4.0)
MCH: 24.4 pg — AB (ref 26.0–34.0)
MCHC: 29.9 g/dL — ABNORMAL LOW (ref 30.0–36.0)
MCV: 81.7 fL (ref 80.0–100.0)
MONO ABS: 0.6 10*3/uL (ref 0.1–1.0)
MONOS PCT: 9 %
NEUTROS PCT: 45 %
Neutro Abs: 2.8 10*3/uL (ref 1.7–7.7)
PLATELETS: 216 10*3/uL (ref 150–400)
RBC: 6.27 MIL/uL — ABNORMAL HIGH (ref 4.22–5.81)
RDW: 15.9 % — AB (ref 11.5–15.5)
WBC: 6 10*3/uL (ref 4.0–10.5)
nRBC: 0 % (ref 0.0–0.2)

## 2018-10-26 LAB — COMPREHENSIVE METABOLIC PANEL
ALT: 17 U/L (ref 0–44)
AST: 19 U/L (ref 15–41)
Albumin: 3.8 g/dL (ref 3.5–5.0)
Alkaline Phosphatase: 98 U/L (ref 38–126)
Anion gap: 7 (ref 5–15)
BILIRUBIN TOTAL: 0.6 mg/dL (ref 0.3–1.2)
BUN: 15 mg/dL (ref 6–20)
CO2: 27 mmol/L (ref 22–32)
CREATININE: 1.04 mg/dL (ref 0.61–1.24)
Calcium: 9.2 mg/dL (ref 8.9–10.3)
Chloride: 103 mmol/L (ref 98–111)
Glucose, Bld: 126 mg/dL — ABNORMAL HIGH (ref 70–99)
Potassium: 4 mmol/L (ref 3.5–5.1)
Sodium: 137 mmol/L (ref 135–145)
TOTAL PROTEIN: 7.1 g/dL (ref 6.5–8.1)

## 2018-10-26 LAB — URINALYSIS, ROUTINE W REFLEX MICROSCOPIC
Bilirubin Urine: NEGATIVE
GLUCOSE, UA: NEGATIVE mg/dL
HGB URINE DIPSTICK: NEGATIVE
KETONES UR: NEGATIVE mg/dL
Leukocytes, UA: NEGATIVE
Nitrite: NEGATIVE
PROTEIN: NEGATIVE mg/dL
Specific Gravity, Urine: 1.019 (ref 1.005–1.030)
pH: 5 (ref 5.0–8.0)

## 2018-10-26 LAB — LIPASE, BLOOD: LIPASE: 27 U/L (ref 11–51)

## 2018-10-26 MED ORDER — SODIUM CHLORIDE 0.9 % IV BOLUS
1000.0000 mL | Freq: Once | INTRAVENOUS | Status: AC
  Administered 2018-10-26: 1000 mL via INTRAVENOUS

## 2018-10-26 MED ORDER — SODIUM CHLORIDE 0.9 % IV SOLN
INTRAVENOUS | Status: DC
  Administered 2018-10-26: 08:00:00 via INTRAVENOUS

## 2018-10-26 MED ORDER — MORPHINE SULFATE (PF) 4 MG/ML IV SOLN
4.0000 mg | Freq: Once | INTRAVENOUS | Status: AC
  Administered 2018-10-26: 4 mg via INTRAVENOUS
  Filled 2018-10-26: qty 1

## 2018-10-26 MED ORDER — ONDANSETRON HCL 4 MG/2ML IJ SOLN
4.0000 mg | Freq: Once | INTRAMUSCULAR | Status: AC
  Administered 2018-10-26: 4 mg via INTRAVENOUS
  Filled 2018-10-26: qty 2

## 2018-10-26 MED ORDER — DICYCLOMINE HCL 20 MG PO TABS: 20.0000 mg | ORAL_TABLET | Freq: Three times a day (TID) | ORAL | 0 refills | Status: DC | PRN

## 2018-10-26 MED ORDER — OMEPRAZOLE 20 MG PO CPDR: 20.0000 mg | DELAYED_RELEASE_CAPSULE | Freq: Every day | ORAL | 0 refills | Status: DC

## 2018-10-26 MED ORDER — NAPROXEN 500 MG PO TABS: 500.0000 mg | ORAL_TABLET | Freq: Two times a day (BID) | ORAL | 0 refills | Status: DC

## 2018-10-26 NOTE — ED Triage Notes (Signed)
Pt arrives to ED from home with complaints of right lower quadrant since last night. Pt denies chest pain, nausea, or vomiting. Pt placed in position of comfort with bed locked and lowered, call bell in reach.

## 2018-10-26 NOTE — Discharge Instructions (Addendum)
Follow up with your primary care doctor to see if your symptoms are improving next week, take the medications as prescribed, return for fever, shortness of breath, or other concerning symptoms

## 2018-10-26 NOTE — ED Notes (Signed)
Pt reminded of need for urine sample.  

## 2018-10-26 NOTE — ED Notes (Signed)
Patient transported to X-ray 

## 2018-10-26 NOTE — ED Notes (Signed)
Pt voices understanding of discharge and close follow up with PCP regarding blood pressure. Has an appointment next week. NAD. Wheeled out of department.

## 2018-10-26 NOTE — ED Provider Notes (Signed)
[place] EMERGENCY DEPARTMENT Provider Note   CSN: [id number] Arrival date & time: 10/26/18  3094     History   Chief Complaint Chief Complaint  Patient presents with  . Abdominal Pain    HPI Thomas Yoder is a 57 y.o. male.  HPI  Pt has been having sharp pain in his abdomen for the last serveral days.  Initially it would come and go and last a few hours but last night it was more constant.    He has two to three loose stools per day.  No nausea or vomiting.  No dysuria.  Nothing seems to make it better or worse.  No prior abdominal pain issues.  NO prior surgeries.   No alcohol use.  No chest pain or shortness of breath. Past Medical History:  Diagnosis Date  . Depression   . Diabetes mellitus without complication ([place])   . Hypertension     Patient Active Problem List   Diagnosis Date Noted  . Primary osteoarthritis of left knee 11/01/2016  . Tobacco use disorder 09/18/2014  . Essential hypertension, benign 08/21/2012  . BMI 34.0-34.9,adult 08/21/2012  . Knee osteoarthritis 08/21/2012    Past Surgical History:  Procedure Laterality Date  . KNEE SURGERY    . TOTAL KNEE ARTHROPLASTY Left 11/01/2016   Procedure: TOTAL KNEE ARTHROPLASTY;  Surgeon: Melrose Nakayama, MD;  Location: [facility];  Service: Orthopedics;  Laterality: Left;        Home Medications    Prior to Admission medications   Medication Sig Start Date End Date Taking? Authorizing Provider  aspirin EC 325 MG EC tablet Take 1 tablet (325 mg total) by mouth 2 (two) times daily after a meal. Patient not taking: Reported on 10/18/2018 11/03/16   Loni Dolly, PA-C  atorvastatin (LIPITOR) 20 MG tablet Take 1 tablet (20 mg total) by mouth daily. 10/23/18   Tenna Delaine D, PA-C  Blood Pressure Monitoring (ADULT BLOOD PRESSURE CUFF LG) KIT Check blood pressure daily and record readings. Patient not taking: Reported on 10/18/2018 10/05/16   [name], MD  dicyclomine (BENTYL) 20 MG  tablet Take 1 tablet (20 mg total) by mouth 3 (three) times daily as needed for spasms. 10/26/18   Dorie Rank, MD  hydrochlorothiazide (HYDRODIURIL) 12.5 MG tablet Take 1 tablet (12.5 mg total) by mouth daily. 10/18/18   Tenna Delaine D, PA-C  lisinopril (PRINIVIL,ZESTRIL) 20 MG tablet Take 1 tablet (20 mg total) by mouth daily. 10/18/18   Tenna Delaine D, PA-C  metFORMIN (GLUCOPHAGE) 500 MG tablet Take 1 tablet (500 mg total) by mouth daily with breakfast. 10/18/18   Tenna Delaine D, PA-C  naproxen (NAPROSYN) 500 MG tablet Take 1 tablet (500 mg total) by mouth 2 (two) times daily with a meal. As needed for pain 10/26/18   Dorie Rank, MD  omeprazole (PRILOSEC) 20 MG capsule Take 1 capsule (20 mg total) by mouth daily. 10/26/18   Dorie Rank, MD    Family History Family History  Problem Relation Age of Onset  . Hypertension Mother   . Hypertension Father     Social History Social History   Tobacco Use  . Smoking status: Current Every Day Smoker    Packs/day: 0.50    Years: 3.00    Pack years: 1.50    Types: Cigarettes  . Smokeless tobacco: Never Used  Substance Use Topics  . Alcohol use: No  . Drug use: No     Allergies   Patient has no  known allergies.   Review of Systems Review of Systems  All other systems reviewed and are negative.    Physical Exam Updated Vital Signs BP (!) 157/89   Pulse (!) 57   Temp 98.2 F (36.8 C) (Oral)   Resp 15   Ht 1.753 m (5' 9")   Wt 108.9 kg   SpO2 93%   BMI 35.44 kg/m   Physical Exam  Constitutional: He appears well-developed and well-nourished. No distress.  HENT:  Head: Normocephalic and atraumatic.  Right Ear: External ear normal.  Left Ear: External ear normal.  Eyes: Conjunctivae are normal. Right eye exhibits no discharge. Left eye exhibits no discharge. No scleral icterus.  Neck: Neck supple. No tracheal deviation present.  Cardiovascular: Normal rate, regular rhythm and intact distal pulses.    Pulmonary/Chest: Effort normal and breath sounds normal. No stridor. No respiratory distress. He has no wheezes. He has no rales.  Abdominal: Soft. Bowel sounds are normal. He exhibits no distension. There is tenderness in the right upper quadrant. There is no rigidity, no rebound and no guarding. No hernia.  Musculoskeletal: He exhibits no edema or tenderness.  Neurological: He is alert. He has normal strength. No cranial nerve deficit (no facial droop, extraocular movements intact, no slurred speech) or sensory deficit. He exhibits normal muscle tone. He displays no seizure activity. Coordination normal.  Skin: Skin is warm and dry. No rash noted.  Psychiatric: He has a normal mood and affect.  Nursing note and vitals reviewed.    ED Treatments / Results  Labs (all labs ordered are listed, but only abnormal results are displayed) Labs Reviewed  COMPREHENSIVE METABOLIC PANEL - Abnormal; Notable for the following components:      Result Value   Glucose, Bld 126 (*)    All other components within normal limits  CBC WITH DIFFERENTIAL/PLATELET - Abnormal; Notable for the following components:   RBC 6.27 (*)    MCH 24.4 (*)    MCHC 29.9 (*)    RDW 15.9 (*)    All other components within normal limits  LIPASE, BLOOD  URINALYSIS, ROUTINE W REFLEX MICROSCOPIC    EKG EKG Interpretation  Date/Time:  Friday October 26 2018 07:32:51 EST Ventricular Rate:  73 PR Interval:    QRS Duration: 85 QT Interval:  370 QTC Calculation: 408 R Axis:   -29 Text Interpretation:  Sinus rhythm Borderline left axis deviation Abnormal R-wave progression, late transition No significant change since last tracing Confirmed by Dorie Rank [id number]) on 10/26/2018 7:39:35 AM   Radiology Dg Abd Acute W/chest  Result Date: 10/26/2018 CLINICAL DATA:  57 year old male with right abdominal pain for 1 week, progressive. EXAM: DG ABDOMEN ACUTE W/ 1V CHEST COMPARISON:  Chest radiographs 03/14/2018 and earlier. Lumbar  radiographs 05/14/2008. FINDINGS: Stable lung volumes and mediastinal contours. No pneumothorax or pneumoperitoneum. Stable lung markings. No acute pulmonary opacity. Visualized tracheal air column is within normal limits. Large round 20 millimeter calcified object to the right of the L3 vertebra projecting over the right transverse process is new since 2009. This partially projects over the right renal contour and may be within the renal pelvis. No other suspicious calcification; left hemipelvis phlebolith suspected. Superimposed Non obstructed bowel gas pattern. Other abdominal and pelvic visceral contours are normal. No acute osseous abnormality identified. IMPRESSION: 1. Large 2 cm right renal calculus suspected. Recommend CT Abdomen and Pelvis, noncontrast should suffice. 2. Non obstructed bowel gas pattern, no free air. 3. No acute cardiopulmonary abnormality. Electronically Signed  By: Genevie Ann M.D.   On: 10/26/2018 08:40   Ct Renal Stone Study  Result Date: 10/26/2018 CLINICAL DATA:  Right lower quadrant pain for 2 days. EXAM: CT ABDOMEN AND PELVIS WITHOUT CONTRAST TECHNIQUE: Multidetector CT imaging of the abdomen and pelvis was performed following the standard protocol without IV contrast. COMPARISON:  None. FINDINGS: Lower chest: No acute abnormality. Hepatobiliary: No focal liver abnormality is seen. No gallstones, gallbladder wall thickening, or biliary dilatation. Pancreas: Unremarkable. No pancreatic ductal dilatation or surrounding inflammatory changes. Spleen: Normal in size without focal abnormality. Adrenals/Urinary Tract: Adrenal glands are unremarkable. Kidneys are normal, without renal calculi, focal lesion, or hydronephrosis. Bladder is unremarkable. Stomach/Bowel: There is a small hiatal hernia. Stomach is otherwise within normal limits. Appendix appears normal. No evidence of bowel wall thickening, distention, or inflammatory changes. Vascular/Lymphatic: Aortic atherosclerosis. No  enlarged abdominal or pelvic lymph nodes. Reproductive: Prostate calcifications are noted. Other: Bilateral inguinal herniation of mesenteric fat is noted. Musculoskeletal: Degenerative joint changes of the spine are noted. IMPRESSION: No acute abnormality identified in the abdomen pelvis. No kidney stones or hydronephrosis is identified bilaterally. Appendix is normal. No bowel obstruction. Electronically Signed   By: Abelardo Diesel M.D.   On: 10/26/2018 10:09    Procedures Procedures (including critical care time)  Medications Ordered in ED Medications  sodium chloride 0.9 % bolus 1,000 mL (0 mLs Intravenous Stopped 10/26/18 1111)    And  0.9 %  sodium chloride infusion ( Intravenous Stopped 10/26/18 1111)  morphine 4 MG/ML injection 4 mg (4 mg Intravenous Given 10/26/18 0746)  ondansetron (ZOFRAN) injection 4 mg (4 mg Intravenous Given 10/26/18 0746)     Initial Impression / Assessment and Plan / ED Course  I have reviewed the triage vital signs and the nursing notes.  Pertinent labs & imaging results that were available during my care of the patient were reviewed by me and considered in my medical decision making (see chart for details).  Clinical Course as of Oct 26 1114  Fri Oct 26, 2018  0909 Plain film findings noted.  CT Scan ordered   [JK]  8032 Labs reviewed.  Essentially all normal.  CT scan also did not show any acute findings   [JK]    Clinical Course User Index [JK] Dorie Rank, MD    presented to the emergency room for evaluation of upper abdominal pain.  He denied having any trouble with vomiting, diarrhea or fever.  No shortness of breath.  ED work-up is reassuring but does not give [place] the source of his pain.  No signs of gallstones.  No ureteral stones.  No pneumonia.  No symptoms to suggest PE.  It is possible this could be musculoskeletal in nature.  Gastritis, peptic ulcer or less likely.  Plan on discharge home with medications for antacids, antispasmodics and pain.   Follow-up with his primary care doctor to be rechecked   Final Clinical Impressions(s) / ED Diagnoses   Final diagnoses:  Right upper quadrant abdominal pain    ED Discharge Orders         Ordered    naproxen (NAPROSYN) 500 MG tablet  2 times daily with meals     10/26/18 1112    dicyclomine (BENTYL) 20 MG tablet  3 times daily PRN     10/26/18 1112    omeprazole (PRILOSEC) 20 MG capsule  Daily     10/26/18 1112           Dorie Rank, MD 10/26/18  [place] [facility]

## 2018-11-02 ENCOUNTER — Encounter: Payer: Self-pay | Admitting: Physician Assistant

## 2018-11-02 ENCOUNTER — Ambulatory Visit (INDEPENDENT_AMBULATORY_CARE_PROVIDER_SITE_OTHER): Payer: BC Managed Care – PPO | Admitting: Physician Assistant

## 2018-11-02 VITALS — BP 179/100 | HR 86 | Temp 98.8°F | Resp 16 | Ht 67.5 in | Wt 245.4 lb

## 2018-11-02 DIAGNOSIS — I10 Essential (primary) hypertension: Secondary | ICD-10-CM

## 2018-11-02 MED ORDER — BLOOD PRESSURE KIT DEVI: 1.0000 | Freq: Every day | 0 refills | Status: DC

## 2018-11-02 MED ORDER — LISINOPRIL 40 MG PO TABS: 40.0000 mg | ORAL_TABLET | Freq: Every day | ORAL | 0 refills | Status: DC

## 2018-11-02 MED ORDER — HYDROCHLOROTHIAZIDE 25 MG PO TABS: 25.0000 mg | ORAL_TABLET | Freq: Every day | ORAL | 0 refills | Status: DC

## 2018-11-02 NOTE — Progress Notes (Signed)
MRN: [id number] DOB: 1961/03/10  Subjective:   Thomas Yoder is a 57 y.o. male presenting for follow up on Hypertension. Restarted on hydrochlorothiazide 12.9m daily and lisinopril 213mdaily at last OV on 10/18/18. Today, reports taking medication as prescribed. He has not checked his bp outside the office.  Denies lightheadedness, dizziness, chronic headache, double vision, chest pain, shortness of breath, heart racing, palpitations, nausea, vomiting, abdominal pain, hematuria, lower leg swelling. Lifestyle: Has not changed anything with diet since last visit. Did stop smoking. Denies any other aggravating or relieving factors, no other questions or concerns.  DoMirkoas a current medication list which includes the following prescription(s): aspirin, atorvastatin, adult blood pressure cuff lg, dicyclomine, metformin, naproxen, omeprazole, blood pressure kit, hydrochlorothiazide, and lisinopril. Also has No Known Allergies.  DoEliamhas a past medical history of Depression, Diabetes mellitus without complication ([place] and Hypertension. Also  has a past surgical history that includes Knee surgery and Total knee arthroplasty (Left, 11/01/2016).   Objective:   Vitals: BP (!) 179/100 (BP Location: Left Arm, Patient Position: Sitting, Cuff Size: Large)   Pulse 86   Temp 98.8 F (37.1 C) (Oral)   Resp 16   Ht 5' 7.5" (1.715 m)   Wt 245 lb 6.4 oz (111.3 kg)   SpO2 98%   BMI 37.87 kg/m   Physical Exam  Constitutional: He is oriented to person, place, and time. He appears well-developed and well-nourished. No distress.  HENT:  Head: Normocephalic and atraumatic.  Mouth/Throat: Uvula is midline, oropharynx is clear and moist and mucous membranes are normal. No tonsillar exudate.  Eyes: Pupils are equal, round, and reactive to light. Conjunctivae and EOM are normal.  Neck: Normal range of motion.  Cardiovascular: Normal rate, regular rhythm, normal heart sounds and intact distal pulses.    Pulmonary/Chest: Effort normal and breath sounds normal. He has no decreased breath sounds. He has no wheezes. He has no rhonchi. He has no rales.  Musculoskeletal:       Right lower leg: He exhibits no swelling.       Left lower leg: He exhibits no swelling.  Neurological: He is alert and oriented to person, place, and time.  Skin: Skin is warm and dry.  Psychiatric: He has a normal mood and affect.  Vitals reviewed.    BP Readings from Last 3 Encounters:  11/02/18 (!) 179/100  10/26/18 (!) 175/107  10/18/18 (!) 150/100    No results found for this or any previous visit (from the past 24 hour(s)).  Assessment and Plan :  1. Essential hypertension BP remains elevated, increased since last viist. He is not checking it outside office. Given Rx for blood pressure monitor. At this time, will increase hctz and lisinopril to 2529mnd 106m97mily. He is asymptomatic. Instructed to check bp outside of office over the next couple of weeks. Return in 2 weeks for bp recheck only, if elevated >140/90 suggest seeing a provider within 2 weeks as they may need to add on 3rd agent. Given strict ED precautions.   Meds ordered this encounter  Medications  . lisinopril (PRINIVIL,ZESTRIL) 40 MG tablet    Sig: Take 1 tablet (40 mg total) by mouth daily.    Dispense:  90 tablet    Refill:  0    Order Specific Question:   Supervising Provider    Answer:   SAGAHorald [id number] (HYDRODIURIL) 25 MG tablet    Sig: Take 1 tablet (25  mg total) by mouth daily.    Dispense:  90 tablet    Refill:  0    Order Specific Question:   Supervising Provider    Answer:   Horald Pollen [id number]  . Blood Pressure Monitoring (BLOOD PRESSURE KIT) DEVI    Sig: 1 Device by Does not apply route daily.    Dispense:  1 Device    Refill:  0    Order Specific Question:   Supervising Provider    Answer:   Horald Pollen [id number]    Side effects, risks, benefits, and  alternatives of the medications and treatment plan prescribed today were discussed, and patient expressed understanding of the instructions given. No barriers to understanding were identified. Red flags discussed in detail. Pt expressed understanding regarding what to do in case of emergency/urgent symptoms.     Tenna Delaine, PA-C  [facility] 11/02/2018 9:55 AM

## 2018-11-02 NOTE — Patient Instructions (Addendum)
For blood pressure: right now you are taking hydrochlorothiazide 12.5mg  daily and lisinopril 20mg  daily. I want you to double both of these tablets to a total of hydrochlorothiazide 25 mg daily and lisinopril 40 mg daily. When you pick up these new prescriptions, it will be for this dose so you only have to take one tablet of each.  Pick up lipitor from your pharmacy for cholesterol control.  I have sent a prescription for blood pressure monitor to your pharmacy, if too expensive, get one from [facility].  I would like you to check your blood pressure at least a couple times over the next week outside of the office and document these values. It is best if you check the blood pressure at different times in the day. Your goal is <140/90. If your values are consistently above this goal, please return to office for further evaluation. Otherwise, return in 2 weeks for blood pressure check. If you start to have chest pain, blurred vision, shortness of breath, severe headache, lower leg swelling, or nausea/vomiting please seek care immediately here or at the ED.      If you have lab work done today you will be contacted with your lab results within the next 2 weeks.  If you have not heard from [place] then please contact us. The fastest way to get your results is to register for My Chart.   Hypertension Hypertension is another name for high blood pressure. High blood pressure forces your heart to work harder to pump blood. This can cause problems over time. There are two numbers in a blood pressure reading. There is a top number (systolic) over a bottom number (diastolic). It is best to have a blood pressure below 120/80. Healthy choices can help lower your blood pressure. You may need medicine to help lower your blood pressure if:  Your blood pressure cannot be lowered with healthy choices.  Your blood pressure is higher than 130/80.  Follow these instructions at home: Eating and drinking  If directed,  follow the DASH eating plan. This diet includes: ? Filling half of your plate at each meal with fruits and vegetables. ? Filling one quarter of your plate at each meal with whole grains. Whole grains include whole wheat pasta, brown rice, and whole grain bread. ? Eating or drinking low-fat dairy products, such as skim milk or low-fat yogurt. ? Filling one quarter of your plate at each meal with low-fat (lean) proteins. Low-fat proteins include fish, skinless chicken, eggs, beans, and tofu. ? Avoiding fatty meat, cured and processed meat, or chicken with skin. ? Avoiding premade or processed food.  Eat less than 1,500 mg of salt (sodium) a day.  Limit alcohol use to no more than 1 drink a day for nonpregnant women and 2 drinks a day for men. One drink equals 12 oz of beer, 5 oz of wine, or 1 oz of hard liquor. Lifestyle  Work with your doctor to stay at a healthy weight or to lose weight. Ask your doctor what the best weight is for you.  Get at least 30 minutes of exercise that causes your heart to beat faster (aerobic exercise) most days of the week. This may include walking, swimming, or biking.  Get at least 30 minutes of exercise that strengthens your muscles (resistance exercise) at least 3 days a week. This may include lifting weights or pilates.  Do not use any products that contain nicotine or tobacco. This includes cigarettes and e-cigarettes. If you need  help quitting, ask your doctor.  Check your blood pressure at home as told by your doctor.  Keep all follow-up visits as told by your doctor. This is important. Medicines  Take over-the-counter and prescription medicines only as told by your doctor. Follow directions carefully.  Do not skip doses of blood pressure medicine. The medicine does not work as well if you skip doses. Skipping doses also puts you at risk for problems.  Ask your doctor about side effects or reactions to medicines that you should watch for. Contact a  doctor if:  You think you are having a reaction to the medicine you are taking.  You have headaches that keep coming back (recurring).  You feel dizzy.  You have swelling in your ankles.  You have trouble with your vision. Get help right away if:  You get a very bad headache.  You start to feel confused.  You feel weak or numb.  You feel faint.  You get very bad pain in your: ? Chest. ? Belly (abdomen).  You throw up (vomit) more than once.  You have trouble breathing. Summary  Hypertension is another name for high blood pressure.  Making healthy choices can help lower blood pressure. If your blood pressure cannot be controlled with healthy choices, you may need to take medicine. This information is not intended to replace advice given to you by your health care provider. Make sure you discuss any questions you have with your health care provider. Document Released: 05/23/2008 Document Revised: 11/02/2016 Document Reviewed: 11/02/2016 Elsevier Interactive Patient Education  2018 [employer].  IF you received an x-ray today, you will receive an invoice from [facility]. Please contact [facility] at [phone] with questions or concerns regarding your invoice.   IF you received labwork today, you will receive an invoice from [facility]. Please contact [facility] at [phone] with questions or concerns regarding your invoice.   Our billing staff will not be able to assist you with questions regarding bills from these companies.  You will be contacted with the lab results as soon as they are available. The fastest way to get your results is to activate your My Chart account. Instructions are located on the last page of this paperwork. If you have not heard from [place] regarding the results in 2 weeks, please contact this office.

## 2018-11-07 ENCOUNTER — Encounter: Payer: Self-pay | Admitting: Physician Assistant

## 2018-11-07 NOTE — Progress Notes (Signed)
Lab letter has been sent via mail to patient home address 

## 2018-11-26 ENCOUNTER — Encounter (HOSPITAL_COMMUNITY): Payer: Self-pay | Admitting: Emergency Medicine

## 2018-11-26 ENCOUNTER — Ambulatory Visit (INDEPENDENT_AMBULATORY_CARE_PROVIDER_SITE_OTHER): Payer: BC Managed Care – PPO | Admitting: Family Medicine

## 2018-11-26 ENCOUNTER — Other Ambulatory Visit: Payer: Self-pay

## 2018-11-26 ENCOUNTER — Encounter: Payer: Self-pay | Admitting: Family Medicine

## 2018-11-26 ENCOUNTER — Emergency Department (HOSPITAL_COMMUNITY): Payer: BC Managed Care – PPO

## 2018-11-26 ENCOUNTER — Observation Stay (HOSPITAL_COMMUNITY)
Admission: EM | Admit: 2018-11-26 | Discharge: 2018-11-28 | Disposition: A | Discharge status: Home / Self Care | Payer: BC Managed Care – PPO | Attending: Internal Medicine | Admitting: Internal Medicine

## 2018-11-26 VITALS — BP 174/135 | HR 74 | Ht 67.5 in | Wt 245.0 lb

## 2018-11-26 DIAGNOSIS — R0602 Shortness of breath: Secondary | ICD-10-CM

## 2018-11-26 DIAGNOSIS — Z87891 Personal history of nicotine dependence: Secondary | ICD-10-CM | POA: Insufficient documentation

## 2018-11-26 DIAGNOSIS — Z79899 Other long term (current) drug therapy: Secondary | ICD-10-CM | POA: Insufficient documentation

## 2018-11-26 DIAGNOSIS — Z6837 Body mass index (BMI) 37.0-37.9, adult: Secondary | ICD-10-CM | POA: Insufficient documentation

## 2018-11-26 DIAGNOSIS — R079 Chest pain, unspecified: Principal | ICD-10-CM | POA: Insufficient documentation

## 2018-11-26 DIAGNOSIS — Z791 Long term (current) use of non-steroidal anti-inflammatories (NSAID): Secondary | ICD-10-CM | POA: Diagnosis not present

## 2018-11-26 DIAGNOSIS — I10 Essential (primary) hypertension: Secondary | ICD-10-CM | POA: Diagnosis not present

## 2018-11-26 DIAGNOSIS — R0789 Other chest pain: Secondary | ICD-10-CM

## 2018-11-26 DIAGNOSIS — E669 Obesity, unspecified: Secondary | ICD-10-CM | POA: Diagnosis not present

## 2018-11-26 DIAGNOSIS — Z7982 Long term (current) use of aspirin: Secondary | ICD-10-CM | POA: Insufficient documentation

## 2018-11-26 DIAGNOSIS — R0683 Snoring: Secondary | ICD-10-CM | POA: Diagnosis not present

## 2018-11-26 DIAGNOSIS — R072 Precordial pain: Secondary | ICD-10-CM

## 2018-11-26 DIAGNOSIS — E119 Type 2 diabetes mellitus without complications: Secondary | ICD-10-CM

## 2018-11-26 DIAGNOSIS — E118 Type 2 diabetes mellitus with unspecified complications: Secondary | ICD-10-CM

## 2018-11-26 DIAGNOSIS — R9431 Abnormal electrocardiogram [ECG] [EKG]: Secondary | ICD-10-CM

## 2018-11-26 HISTORY — DX: Obesity, unspecified: E66.9

## 2018-11-26 HISTORY — DX: Snoring: R06.83

## 2018-11-26 HISTORY — DX: Chest pain, unspecified: R07.9

## 2018-11-26 HISTORY — DX: Personal history of nicotine dependence: Z87.891

## 2018-11-26 LAB — HEMOGLOBIN A1C
Hgb A1c MFr Bld: 6.4 % — ABNORMAL HIGH (ref 4.8–5.6)
Mean Plasma Glucose: 136.98 mg/dL

## 2018-11-26 LAB — BASIC METABOLIC PANEL
Anion gap: 10 (ref 5–15)
BUN: 19 mg/dL (ref 6–20)
CHLORIDE: 103 mmol/L (ref 98–111)
CO2: 25 mmol/L (ref 22–32)
CREATININE: 1.2 mg/dL (ref 0.61–1.24)
Calcium: 9.4 mg/dL (ref 8.9–10.3)
GFR calc non Af Amer: >60 mL/min (ref >60)
GLUCOSE: 90 mg/dL (ref 70–99)
Potassium: 4.3 mmol/L (ref 3.5–5.1)
Sodium: 138 mmol/L (ref 135–145)

## 2018-11-26 LAB — I-STAT TROPONIN, ED: Troponin i, poc: 0.01 ng/mL (ref 0.00–0.08)

## 2018-11-26 LAB — GLUCOSE, CAPILLARY: GLUCOSE-CAPILLARY: 169 mg/dL — AB (ref 70–99)

## 2018-11-26 LAB — CBC
HEMATOCRIT: 47 % (ref 39.0–52.0)
Hemoglobin: 14.1 g/dL (ref 13.0–17.0)
MCH: 24.4 pg — ABNORMAL LOW (ref 26.0–34.0)
MCHC: 30 g/dL (ref 30.0–36.0)
MCV: 81.5 fL (ref 80.0–100.0)
NRBC: 0 % (ref 0.0–0.2)
Platelets: 222 10*3/uL (ref 150–400)
RBC: 5.77 MIL/uL (ref 4.22–5.81)
RDW: 15.8 % — AB (ref 11.5–15.5)
WBC: 5.5 10*3/uL (ref 4.0–10.5)

## 2018-11-26 LAB — TROPONIN I: Troponin I: <0.03 ng/mL (ref <0.03)

## 2018-11-26 MED ORDER — AMLODIPINE BESYLATE 10 MG PO TABS: 10.0000 mg | ORAL_TABLET | Freq: Every day | ORAL | 3 refills | Status: DC

## 2018-11-26 MED ORDER — ACETAMINOPHEN 325 MG PO TABS: 650.0000 mg | ORAL_TABLET | ORAL | Status: DC | PRN

## 2018-11-26 MED ORDER — HYDROCHLOROTHIAZIDE 25 MG PO TABS: 25.0000 mg | ORAL_TABLET | Freq: Every day | ORAL | 1 refills | Status: DC

## 2018-11-26 MED ORDER — MORPHINE SULFATE (PF) 2 MG/ML IV SOLN
2.0000 mg | INTRAVENOUS | Status: DC | PRN
  Filled 2018-11-26: qty 1

## 2018-11-26 MED ORDER — AMLODIPINE BESYLATE 10 MG PO TABS: 10.0000 mg | ORAL_TABLET | Freq: Every day | ORAL | 1 refills | Status: DC

## 2018-11-26 MED ORDER — LISINOPRIL 40 MG PO TABS: 40.0000 mg | ORAL_TABLET | Freq: Every day | ORAL | 1 refills | Status: DC

## 2018-11-26 MED ORDER — ONDANSETRON HCL 4 MG/2ML IJ SOLN: 4.0000 mg | Freq: Four times a day (QID) | INTRAMUSCULAR | Status: DC | PRN

## 2018-11-26 MED ORDER — PANTOPRAZOLE SODIUM 40 MG PO TBEC
40.0000 mg | DELAYED_RELEASE_TABLET | Freq: Every day | ORAL | Status: DC
  Administered 2018-11-27 – 2018-11-28 (×2): 40 mg via ORAL
  Filled 2018-11-26 (×3): qty 1

## 2018-11-26 MED ORDER — CLOPIDOGREL BISULFATE 75 MG PO TABS: 75.0000 mg | ORAL_TABLET | Freq: Every day | ORAL | Status: DC

## 2018-11-26 MED ORDER — LISINOPRIL 20 MG PO TABS
20.0000 mg | ORAL_TABLET | Freq: Two times a day (BID) | ORAL | Status: DC
  Administered 2018-11-26 – 2018-11-28 (×4): 20 mg via ORAL
  Filled 2018-11-26 (×4): qty 1

## 2018-11-26 MED ORDER — CLOPIDOGREL BISULFATE 300 MG PO TABS
300.0000 mg | ORAL_TABLET | Freq: Once | ORAL | Status: AC
  Administered 2018-11-26: 300 mg via ORAL
  Filled 2018-11-26: qty 1

## 2018-11-26 MED ORDER — ASPIRIN EC 81 MG PO TBEC
81.0000 mg | DELAYED_RELEASE_TABLET | Freq: Every day | ORAL | Status: DC
  Administered 2018-11-27 – 2018-11-28 (×2): 81 mg via ORAL
  Filled 2018-11-26 (×3): qty 1

## 2018-11-26 MED ORDER — ATORVASTATIN CALCIUM 10 MG PO TABS
20.0000 mg | ORAL_TABLET | Freq: Every day | ORAL | Status: DC
  Administered 2018-11-26 – 2018-11-28 (×3): 20 mg via ORAL
  Filled 2018-11-26 (×3): qty 2

## 2018-11-26 MED ORDER — ASPIRIN EC 325 MG PO TBEC: 325.0000 mg | DELAYED_RELEASE_TABLET | Freq: Two times a day (BID) | ORAL | Status: DC

## 2018-11-26 MED ORDER — HYDROCHLOROTHIAZIDE 25 MG PO TABS
25.0000 mg | ORAL_TABLET | Freq: Every day | ORAL | Status: DC
  Administered 2018-11-27 – 2018-11-28 (×2): 25 mg via ORAL
  Filled 2018-11-26 (×3): qty 1

## 2018-11-26 MED ORDER — ENOXAPARIN SODIUM 40 MG/0.4ML ~~LOC~~ SOLN
40.0000 mg | SUBCUTANEOUS | Status: DC
  Administered 2018-11-27 – 2018-11-28 (×2): 40 mg via SUBCUTANEOUS
  Filled 2018-11-26 (×3): qty 0.4

## 2018-11-26 MED ORDER — AMLODIPINE BESYLATE 5 MG PO TABS: 5.0000 mg | ORAL_TABLET | Freq: Every day | ORAL | Status: DC

## 2018-11-26 MED ORDER — METOPROLOL TARTRATE 25 MG PO TABS
25.0000 mg | ORAL_TABLET | ORAL | Status: DC
  Administered 2018-11-27: 25 mg via ORAL
  Filled 2018-11-26 (×2): qty 1

## 2018-11-26 MED ORDER — INSULIN ASPART 100 UNIT/ML ~~LOC~~ SOLN
0.0000 [IU] | SUBCUTANEOUS | Status: DC
  Administered 2018-11-27: 1 [IU] via SUBCUTANEOUS

## 2018-11-26 MED ORDER — ATORVASTATIN CALCIUM 20 MG PO TABS: 20.0000 mg | ORAL_TABLET | Freq: Every day | ORAL | 3 refills | Status: DC

## 2018-11-26 NOTE — Consult Note (Addendum)
Cardiology Consultation:   Patient ID: Thomas Yoder; [id number]; 02-23-1961   Admit date: 11/26/2018 Date of Consult: 11/26/2018  Primary Care Provider: [name], MD Primary Cardiologist: Clarnce Flock Dr. Einar Gip remotely; saw Dr. Curt Bears 2017 for HTN  Chief Complaint: elevated BP, also CP  Patient Profile:   Thomas Yoder is a 57 y.o. male with a hx of DM, HTN, intermittent noncompliance, former tobacco abuse and smoking who is being seen today for the evaluation of CP and abnormal EKG at the request of PA Geiple.  History of Present Illness:   Thomas Yoder remotely had a stress test ordered by primary care in 2015 with perfusion demonstrating moderate sized mild ischemia in inferior wall extending from base towards apex, + inferior wall HK, EF 49%. This appears to have been at Dr. Irven Shelling office. He does not recall having this study and didn't follow up with cardiology thereafter until a BP OV with Dr. Curt Bears in 2017. No specific further cardiac testing was advised at that time. He has had issues controlling BP lately prior to dental extraction but had not been taking meds reliably. He says he ran out for a few weeks before getting back on them recently. At PCP's office today upon review of symptoms, he noted rare episodic chest discomfort, L sided, with left arm discomfort. This is nonexertional lasting 5 mins approx every other day for the last week. He denies any recent unusual SOB. No edema, syncope, palpitations. Pain is unprovoked by anything in particular and sometimes improves lifting L arm. It resolves spontaneously. He EKG showed new TWI in I, II, avL, V5-V6 so he was sent to the ER for evaluation. He denies any acute symptom at present time, just wants to eat. He reports he would not have come into the ER for this specific symptom. He works as a Sports coach and does not typically experience any symptoms with exertion. He quit smoking tobacco 3 weeks ago but does smoke marijuana. Fiance  does report he snores heavily.  Past Medical History:  Diagnosis Date  . Depression   . Diabetes mellitus without complication ([place])   . Former tobacco use   . Hypertension     Past Surgical History:  Procedure Laterality Date  . KNEE SURGERY    . 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 [START ON 11/27/2018] metoprolol tartrate  25 mg Oral UD   Continuous Infusions:  PRN Meds:   Home Meds: Prior to Admission medications   Medication Sig Start Date End Date Taking? Authorizing Provider  dicyclomine (BENTYL) 20 MG tablet Take 1 tablet (20 mg total) by mouth 3 (three) times daily as needed for spasms. 10/26/18  Yes Dorie Rank, MD  hydrochlorothiazide (HYDRODIURIL) 12.5 MG tablet Take 12.5 mg by mouth daily.   Yes [provider]  lisinopril (PRINIVIL,ZESTRIL) 20 MG tablet Take 20 mg by mouth 2 (two) times daily.    Yes [provider]  metFORMIN (GLUCOPHAGE) 500 MG tablet Take 1 tablet (500 mg total) by mouth daily with breakfast. 10/18/18  Yes Timmothy Euler, [place] D, PA-C  naproxen (NAPROSYN) 500 MG tablet Take 1 tablet (500 mg total) by mouth 2 (two) times daily with a meal. As needed for pain Patient taking differently: Take 500 mg by mouth 2 (two) times daily as needed (for pain).  10/26/18  Yes Tomi Bamberger,  Wille Glaser, MD  omeprazole (PRILOSEC) 20 MG capsule Take 1 capsule (20 mg total) by mouth daily. 10/26/18  Yes Dorie Rank, MD  amLODipine (NORVASC) 10 MG tablet Take 1 tablet (10 mg total) by mouth daily. 11/26/18   Rutherford Guys, MD  aspirin EC 325 MG EC tablet Take 1 tablet (325 mg total) by mouth 2 (two) times daily after a meal. Patient not taking: Reported on 11/26/2018 11/03/16   Loni Dolly, PA-C  atorvastatin (LIPITOR) 20 MG tablet Take 1 tablet (20 mg total) by mouth daily. 11/26/18   Rutherford Guys, MD  Blood Pressure Monitoring (ADULT BLOOD PRESSURE CUFF LG) KIT Check blood pressure daily and record readings. 10/05/16   [name], MD  Blood Pressure Monitoring (BLOOD PRESSURE KIT) DEVI 1 Device by Does not apply route daily. 11/02/18   Tenna Delaine D, PA-C  hydrochlorothiazide (HYDRODIURIL) 25 MG tablet Take 1 tablet (25 mg total) by mouth daily. 11/26/18   Rutherford Guys, MD  lisinopril (PRINIVIL,ZESTRIL) 40 MG tablet Take 1 tablet (40 mg total) by mouth daily. 11/26/18   Rutherford Guys, MD    Allergies:   No Known Allergies  Social History:   Social History   Socioeconomic History  . Marital status: Single    Spouse name: Not on file  . Number of children: 3  . Years of education: Not on file  . Highest education level: Not on file  Occupational History  . Not on file  Social Needs  . Financial resource strain: Not on file  . Food insecurity:    Worry: Not on file    Inability: Not on file  . Transportation needs:    Medical: Not on file    Non-medical: Not on file  Tobacco Use  . Smoking status: Former Smoker    Packs/day: 0.50    Years: 34.00    Pack years: 17.00    Types: Cigarettes  . Smokeless tobacco: Never Used  . Tobacco comment: 2-3 weeks  Substance and Sexual Activity  . Alcohol use: No  . Drug use: Yes    Types: Marijuana  . Sexual activity: Yes    Birth control/protection: Condom  Lifestyle  . Physical activity:    Days per week: Not on file    Minutes per session: Not on file  . Stress: Not on file  Relationships  . Social connections:    Talks on phone: Not on file    Gets together: Not on file    Attends religious service: Not on file    Active member of club or organization: Not on file    Attends meetings of clubs or organizations: Not on file    Relationship status: Not on file  . Intimate partner violence:    Fear of current or ex partner: Not on file    Emotionally abused: Not on file    Physically abused: Not  on file    Forced sexual activity: Not on file  Other Topics Concern  . Not on file  Social History Narrative  . Not on file    Family History:   The patient's family history includes Hypertension in his father and mother.  ROS:  Please see the history of present illness.  All other ROS reviewed and negative.     Physical Exam/Data:   Vitals:   11/26/18 1700 11/26/18 1715 11/26/18 1730 11/26/18 1745  BP: (!) 148/89 (!) 139/93 (!) 151/95 (!) 144/79  Pulse: 62 68 64  74  Resp:  20    Temp:      TempSrc:      SpO2: 97% 99% 98% 95%  Weight:      Height:       No intake or output data in the 24 hours ending 11/26/18 1902 Filed Weights   11/26/18 1416  Weight: 110.7 kg   Body mass index is 37.65 kg/m.  General: Well developed, well nourished, in no acute distress. Head: Normocephalic, atraumatic, sclera non-icteric, no xanthomas, nares are without discharge.  Neck: Negative for carotid bruits. JVD not elevated. Lungs: Clear bilaterally to auscultation without wheezes, rales, or rhonchi. Breathing is unlabored. Heart: RRR with S1 S2. No murmurs, rubs, or gallops appreciated. Abdomen: Soft, non-tender, non-distended with normoactive bowel sounds. No hepatomegaly. No rebound/guarding. No obvious abdominal masses. Msk:  Strength and tone appear normal for age. Extremities: No clubbing or cyanosis. No edema.  Distal pedal pulses are 2+ and equal bilaterally. Neuro: Alert and oriented X 3. No facial asymmetry. No focal deficit. Moves all extremities spontaneously. Psych:  Responds to questions appropriately with a normal affect.  EKG:  The EKG was personally reviewed and demonstrates NSR with TWI I, II, avL, V5-V6. Prior EKG showed nonspecific TW in V6  Laboratory Data:  Chemistry Recent Labs  Lab 11/26/18 1423  NA 138  K 4.3  CL 103  CO2 25  GLUCOSE 90  BUN 19  CREATININE 1.20  CALCIUM 9.4  GFRNONAA >60  GFRAA >60  ANIONGAP 10    No results for input(s): PROT,  ALBUMIN, AST, ALT, ALKPHOS, BILITOT in the last 168 hours. Hematology Recent Labs  Lab 11/26/18 1423  WBC 5.5  RBC 5.77  HGB 14.1  HCT 47.0  MCV 81.5  MCH 24.4*  MCHC 30.0  RDW 15.8*  PLT 222   Cardiac EnzymesNo results for input(s): TROPONINI in the last 168 hours.  Recent Labs  Lab 11/26/18 1429  TROPIPOC 0.01    BNPNo results for input(s): BNP, PROBNP in the last 168 hours.  DDimer No results for input(s): DDIMER in the last 168 hours.  Radiology/Studies:  Dg Chest 2 View  Result Date: 11/26/2018 CLINICAL DATA:  Left chest and arm pain today.  Abnormal EKG. EXAM: CHEST - 2 VIEW COMPARISON:  PA and lateral chest 03/14/2018 and 02/15/2017. FINDINGS: Lung volumes are lower than on the comparison exams with mild basilar atelectasis. Heart size is upper normal. No pneumothorax or pleural fluid. No acute or focal bony abnormality. IMPRESSION: No acute disease. Electronically Signed   By: Inge Rise M.D.   On: 11/26/2018 14:46    Assessment and Plan:   1. Abnormal EKG/atypical chest discomfort - prior abnormal nuc noted. He denies any high risk symptoms but is somewhat vague in his descriptors. Multiple cardiac risk factors noted. Suspect some component of hypertensive heart disease/LVH based on history and EKG. Plan cardiac CT tomorrow as well as echo. Will given 32m of Lopressor prior to cardiac CT which will be scheduled by CT team tomorrow to achieve HR consistently 65 or less for optimal imaging. Placed order for 18g antecubital IV. OK to eat per Dr. TRadford Pax It appears Dr. PVirgina Jockentered order earlier for Plavix when it was originally thought Dr. GIrven Shellingteam would see the patient. Dr. TRadford Paxdiscussed with EDP and it was determined [facility] would follow as pt saw Dr. CCurt Bearsmore recently in 2017. She does not feel patient requires Plavix at this time. Given absence of CP and negative troponin, she  does not recommend any IV heparin at present time. Cardiology will follow. I  have sent a message to Drs Johnsie Cancel and Aundra Dubin to find out who will be able to read CT study tomorrow. Check UDS for completeness.  2. HTN - Per Dr. Radford Pax, would recommend to continue home HCTZ 12.22m daily, lisinopril 220mBID, and add amlodipine at 1m49maily (had not started at home, was advised to begin 70m36m OV t[place]). Follow.  3. DM - per IM.  4. Snoring - suggest OP sleep study.  For questions or updates, please contact [facility] consult [email or web address] for contact info under Cardiology/STEMI.    Signed, DaynCharlie Pitter-C  11/26/2018 7:02 PM

## 2018-11-26 NOTE — Progress Notes (Addendum)
See consult note. Avry Roedl PA-C

## 2018-11-26 NOTE — H&P (Signed)
Thomas Yoder WIO:[id number] DOB: 01/24/1961 DOA: 11/26/2018     PCP: [name], MD   Outpatient Specialists:   [facility]  Seen Dr. Einar Gip once for [name] Patient arrived to ER on 11/26/18 at 1402  Patient coming from: home Lives alone,    Chief Complaint:  Chief Complaint  Patient presents with  . Chest Pain    HPI: Thomas Yoder is a 57 y.o. male with medical history significant of HTN, DM2, depression, HLD abnormal stress test    Presented with remittent shortness of breath and chest tightness worsening with exertion radiating to left arm and left neck is been going on for the past1 wk no nausea no diaophoresis.  He was at [facility] urgent care today getting refills for his blood pressure medications and reported to the provider EKG was ordered showing T wave inversions worse from prior he was recommended to present to emergency department NO fever no chills no leg swelling   pain improved with lifting up the arm unsure if got worse by activity  No recent travel   Reports snoring  Regarding pertinent Chronic problems: Apparently had cardiac work-up done in the past that showed abnormal [place] 2015 he was supposed to get cardiac catheterization done but never did   History of hypertension on hydrochlorothiazide,lisinopril recently increased to 19 BIDwas supposed to be on Norvasc but did not start  History of diabetes on metformin Per lipidemia on Lipitor  While in ER: Initial troponin unremarkable ER spoke with Dr. Irven Shelling office who felt that since patient never had follow-up in the clinic he was unassigned at which point ER spoke to Dr. Radford Pax with cardiology who agreed to put patient on the list for cardiology to be seen  Currently chest pain free  The following Work up has been ordered so far:  Orders Placed This Encounter  Procedures  . DG Chest 2 View  . Basic metabolic panel  . CBC  . Diet Heart Room service appropriate? Yes; Fluid  consistency: Thin  . Cardiac monitoring  . Saline Lock IV, Maintain IV access  . Patient may eat/drink  . Inpatient consult to Cardiology  . Inpatient consult to Cardiology  . Consult for [facility] Admission  . Pulse oximetry, continuous  . I-stat troponin, ED  . ED EKG within 10 minutes  . EKG 12-Lead     Following Medications were ordered in ER: Medications  clopidogrel (PLAVIX) tablet 300 mg (300 mg Oral Given 11/26/18 1732)    Significant initial  Findings: Abnormal Labs Reviewed  CBC - Abnormal; Notable for the following components:      Result Value   MCH 24.4 (*)    RDW 15.8 (*)    All other components within normal limits     Lactic Acid, Venous No results found for: LATICACIDVEN  Na 138 K 4.3  Cr   stable,   Lab Results  Component Value Date   CREATININE 1.20 11/26/2018   CREATININE 1.04 10/26/2018   CREATININE 1.02 10/18/2018      WBC  5.5  HG/HCT  Stable,     Component Value Date/Time   HGB 14.1 11/26/2018 1423   HGB 14.8 10/18/2018 1452   HCT 47.0 11/26/2018 1423   HCT 46.2 10/18/2018 1452    Troponin (Point of Care Test) Recent Labs    11/26/18 1429  TROPIPOC 0.01       UA  not ordered     CXR -  NON  acute  ECG:  Personally reviewed by me showing: HR : 67 Rhythm:  NSR,    nonspecific changes, TWI [id number]      ED Triage Vitals  Enc Vitals Group     BP 11/26/18 1416 (!) 162/101     Pulse Rate 11/26/18 1416 63     Resp 11/26/18 1416 (!) 24     Temp 11/26/18 1416 97.9 F (36.6 C)     Temp Source 11/26/18 1416 Oral     SpO2 11/26/18 1407 98 %     Weight 11/26/18 1416 244 lb (110.7 kg)     Height 11/26/18 1416 5' 7.5" (1.715 m)     Head Circumference --      Peak Flow --      Pain Score 11/26/18 1406 4     Pain Loc --      Pain Edu? --      Excl. in [place]? --   TMAX(24)@       Latest  Blood pressure (!) 144/79, pulse 74, temperature 97.9 F (36.6 C), temperature source Oral, resp. rate 20, height 5' 7.5" (1.715  m), weight 110.7 kg, SpO2 95 %.     ER Provider Called: Dr. Radford Pax      They Recommend echo cardiac enzymes admit to medicine Will see in ER  Hospitalist was called for admission for chest pain evaluation   Review of Systems:    Pertinent positives include:  chest pain, shortness of breath at rest.  Constitutional:  No weight loss, night sweats, Fevers, chills, fatigue, weight loss  HEENT:  No headaches, Difficulty swallowing,Tooth/dental problems,Sore throat,  No sneezing, itching, ear ache, nasal congestion, post nasal drip,  Cardio-vascular:  No  Orthopnea, PND, anasarca, dizziness, palpitations.no Bilateral lower extremity swelling  GI:  No heartburn, indigestion, abdominal pain, nausea, vomiting, diarrhea, change in bowel habits, loss of appetite, melena, blood in stool, hematemesis Resp:  no  No dyspnea on exertion, No excess mucus, no productive cough, No non-productive cough, No coughing up of blood.No change in color of mucus.No wheezing. Skin:  no rash or lesions. No jaundice GU:  no dysuria, change in color of urine, no urgency or frequency. No straining to urinate.  No flank pain.  Musculoskeletal:  No joint pain or no joint swelling. No decreased range of motion. No back pain.  Psych:  No change in mood or affect. No depression or anxiety. No memory loss.  Neuro: no localizing neurological complaints, no tingling, no weakness, no double vision, no gait abnormality, no slurred speech, no confusion  All systems reviewed and apart from [place] all are negative  Past Medical History:   Past Medical History:  Diagnosis Date  . Depression   . Diabetes mellitus without complication ([place])   . Hypertension       Past Surgical History:  Procedure Laterality Date  . KNEE SURGERY    . 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 His smoking use included cigarettes. He has a 17.00 pack-year smoking history. He has never used smokeless tobacco. He reports that he has current or past drug history. Drug: Marijuana. He reports that he does not drink alcohol.     Family History:   Family History  Problem Relation Age of Onset  . Hypertension  Mother   . Hypertension Father     Allergies: No Known Allergies   Prior to Admission medications   Medication Sig Start Date End Date Taking? Authorizing Provider  dicyclomine (BENTYL) 20 MG tablet Take 1 tablet (20 mg total) by mouth 3 (three) times daily as needed for spasms. 10/26/18  Yes Dorie Rank, MD  hydrochlorothiazide (HYDRODIURIL) 12.5 MG tablet Take 12.5 mg by mouth daily.   Yes [provider]  lisinopril (PRINIVIL,ZESTRIL) 20 MG tablet Take 20 mg by mouth 2 (two) times daily.    Yes [provider]  metFORMIN (GLUCOPHAGE) 500 MG tablet Take 1 tablet (500 mg total) by mouth daily with breakfast. 10/18/18  Yes Timmothy Euler, [place] D, PA-C  naproxen (NAPROSYN) 500 MG tablet Take 1 tablet (500 mg total) by mouth 2 (two) times daily with a meal. As needed for pain Patient taking differently: Take 500 mg by mouth 2 (two) times daily as needed (for pain).  10/26/18  Yes Dorie Rank, MD  omeprazole (PRILOSEC) 20 MG capsule Take 1 capsule (20 mg total) by mouth daily. 10/26/18  Yes Dorie Rank, MD  amLODipine (NORVASC) 10 MG tablet Take 1 tablet (10 mg total) by mouth daily. 11/26/18   Rutherford Guys, MD  aspirin EC 325 MG EC tablet Take 1 tablet (325 mg total) by mouth 2 (two) times daily after a meal. Patient not taking: Reported on 11/26/2018 11/03/16   Loni Dolly, PA-C  atorvastatin (LIPITOR) 20 MG tablet Take 1 tablet (20 mg total) by mouth daily. 11/26/18   Rutherford Guys, MD  Blood Pressure Monitoring (ADULT BLOOD PRESSURE CUFF LG) KIT Check blood pressure daily and record  readings. 10/05/16   [name], MD  Blood Pressure Monitoring (BLOOD PRESSURE KIT) DEVI 1 Device by Does not apply route daily. 11/02/18   Tenna Delaine D, PA-C  hydrochlorothiazide (HYDRODIURIL) 25 MG tablet Take 1 tablet (25 mg total) by mouth daily. 11/26/18   Rutherford Guys, MD  lisinopril (PRINIVIL,ZESTRIL) 40 MG tablet Take 1 tablet (40 mg total) by mouth daily. 11/26/18   Rutherford Guys, MD   Physical Exam: Blood pressure (!) 144/79, pulse 74, temperature 97.9 F (36.6 C), temperature source Oral, resp. rate 20, height 5' 7.5" (1.715 m), weight 110.7 kg, SpO2 95 %. 1. General:  in No Acute distress  well -appearing 2. Psychological: Alert and   Oriented 3. Head/ENT:   Moist Mucous Membranes                          Head Non traumatic, neck supple                          Poor Dentition 4. SKIN: normal   Skin turgor,  Skin clean Dry and intact no rash 5. Heart: Regular rate and rhythm no  Murmur, no Rub or gallop 6. Lungs:  Clear to auscultation bilaterally, no wheezes or crackles   7. Abdomen: Soft,  non-tender, Non distended  obese  bowel sounds present 8. Lower extremities: no clubbing, cyanosis, or  edema 9. Neurologically Grossly intact, moving all 4 extremities equally   10. MSK: Normal range of motion   LABS:     Recent Labs  Lab 11/26/18 1423  WBC 5.5  HGB 14.1  HCT 47.0  MCV 81.5  PLT 703   Basic Metabolic Panel: Recent Labs  Lab 11/26/18 1423  NA 138  K 4.3  CL 103  CO2 25  GLUCOSE 90  BUN 19  CREATININE 1.20  CALCIUM 9.4      No results for input(s): AST, ALT, ALKPHOS, BILITOT, PROT, ALBUMIN in the last 168 hours. No results for input(s): LIPASE, AMYLASE in the last 168 hours. No results for input(s): AMMONIA in the last 168 hours.    HbA1C: No results for input(s): HGBA1C in the last 72 hours. CBG: No results for input(s): GLUCAP in the last 168 hours.    Urine analysis:    Component Value Date/Time   COLORURINE YELLOW  10/26/2018 0732   APPEARANCEUR CLEAR 10/26/2018 0732   LABSPEC 1.019 10/26/2018 0732   PHURINE 5.0 10/26/2018 0732   GLUCOSEU NEGATIVE 10/26/2018 0732   HGBUR NEGATIVE 10/26/2018 0732   BILIRUBINUR NEGATIVE 10/26/2018 0732   BILIRUBINUR negative 10/18/2018 1220   BILIRUBINUR Neg 09/18/2014 1200   KETONESUR NEGATIVE 10/26/2018 0732   PROTEINUR NEGATIVE 10/26/2018 0732   UROBILINOGEN 1.0 10/18/2018 1220   UROBILINOGEN 1.0 12/05/2009 0313   NITRITE NEGATIVE 10/26/2018 0732   LEUKOCYTESUR NEGATIVE 10/26/2018 0732       Cultures: No results found for: SDES, SPECREQUEST, CULT, REPTSTATUS   Radiological Exams on Admission: Dg Chest 2 View  Result Date: 11/26/2018 CLINICAL DATA:  Left chest and arm pain today.  Abnormal EKG. EXAM: CHEST - 2 VIEW COMPARISON:  PA and lateral chest 03/14/2018 and 02/15/2017. FINDINGS: Lung volumes are lower than on the comparison exams with mild basilar atelectasis. Heart size is upper normal. No pneumothorax or pleural fluid. No acute or focal bony abnormality. IMPRESSION: No acute disease. Electronically Signed   By: Inge Rise M.D.   On: 11/26/2018 14:46    Chart has been reviewed    Assessment/Plan   57 y.o. male with medical history significant of HTN, DM2, depression, HLD abnormal stress test  Admitted for Chest pain evaluation  Present on Admission: . Chest pain  - - H=  1   ,E= 1 , A=  1  , R    2  , T 0   for the  Total of 5 therefore will admit for observation and further evaluation ( Risk of MACE: Scores 0-3  of 0.9-1.7%.,  4-6: 12-16.6% , Scores ?7: 50-65% )      - monitor on telemetry, cycle cardiac enzymes, obtain serial ECG and  ECHO in AM.   - Daily aspirin -  Further risk stratify with lipid panel, hgA1C, obtain TSH.  Make sure patient is on Aspirin.  We will notify cardiology regarding patient's admission. Further management depends on pending  workup  . Essential hypertension, benign restart home medications including  hydrochlorothiazide and Norvasc . Snoring will need sleep study as an outpatient  Dm 2 -  - Order Sensitive   SSI     -  check TSH and HgA1C  - Hold by mouth medications    Other plan as per orders.  DVT prophylaxis:    Lovenox     Code Status:  FULL CODE  as per patient  I had personally discussed CODE STATUS with patient    Family Communication:   Family not  at  Bedside    Disposition Plan:        To home once workup is complete and patient is stable  Consults called:  Cardiology Aware  Admission status:  Obs    Level of care    tele  For  24H         Sullivan Blasing 11/26/2018, 8:09 PM    [facility]  Pager [phone]   after 2 AM please page floor coverage PA If 7AM-7PM, please contact the day team taking care of the patient  [email or web address]  Password [email or web address]

## 2018-11-26 NOTE — ED Notes (Signed)
Heart healthy dinner tray ordered 

## 2018-11-26 NOTE — ED Provider Notes (Signed)
[facility] EMERGENCY DEPARTMENT Provider Note   CSN: [id number] Arrival date & time: 11/26/18  1402     History   Chief Complaint Chief Complaint  Patient presents with  . Chest Pain    HPI Thomas Yoder is a 57 y.o. male.  Patient with history of diabetes, hypertension, abnormal Myoview in 2015 --presents with complaint of 2 weeks of intermittent chest pain.  Patient describes a "stiffness" that he gets in his left chest when he is active.  It is not daily but has been occurring frequently.  His last episode was 2 days ago.  He brought this up when he went to his doctor today for a blood pressure appointment.  He had an EKG performed which showed more pronounced T wave inversions in leads I, II, and aVL than in the past.  He was then transferred to the emergency department for further work-up.  Patient reports being out of his blood pressure medications and went back today to have them refilled.  He does not remember seeing a cardiologist in the past.  Per records, he saw Dr. Einar Gip in 2015.  He had a abnormal Myoview in 03/2014.  It appears that he was scheduled for a heart cath but never had a performed in 04/2014.  Patient saw Dr. Curt Bears for preop clearance in 2017.  It does not appear that he is seen a cardiologist since that time.  Patient denies associated diaphoresis, radiation of pain, shortness of breath.  He states that he has swelling in his bilateral legs at the end of the workday, but no unilateral pain or swelling.  No history of blood clots. ASA given by EMS PTA.     Past Medical History:  Diagnosis Date  . Depression   . Diabetes mellitus without complication ([place])   . Hypertension     Patient Active Problem List   Diagnosis Date Noted  . Primary osteoarthritis of left knee 11/01/2016  . Tobacco use disorder 09/18/2014  . Essential hypertension, benign 08/21/2012  . BMI 34.0-34.9,adult 08/21/2012  . Knee osteoarthritis 08/21/2012    Past  Surgical History:  Procedure Laterality Date  . KNEE SURGERY    . TOTAL KNEE ARTHROPLASTY Left 11/01/2016   Procedure: TOTAL KNEE ARTHROPLASTY;  Surgeon: Melrose Nakayama, MD;  Location: [place];  Service: Orthopedics;  Laterality: Left;        Home Medications    Prior to Admission medications   Medication Sig Start Date End Date Taking? Authorizing Provider  dicyclomine (BENTYL) 20 MG tablet Take 1 tablet (20 mg total) by mouth 3 (three) times daily as needed for spasms. 10/26/18  Yes Dorie Rank, MD  hydrochlorothiazide (HYDRODIURIL) 12.5 MG tablet Take 12.5 mg by mouth daily.   Yes [provider]  lisinopril (PRINIVIL,ZESTRIL) 20 MG tablet Take 20 mg by mouth 2 (two) times daily.    Yes [provider]  metFORMIN (GLUCOPHAGE) 500 MG tablet Take 1 tablet (500 mg total) by mouth daily with breakfast. 10/18/18  Yes Timmothy Euler, [place] D, PA-C  naproxen (NAPROSYN) 500 MG tablet Take 1 tablet (500 mg total) by mouth 2 (two) times daily with a meal. As needed for pain Patient taking differently: Take 500 mg by mouth 2 (two) times daily as needed (for pain).  10/26/18  Yes Dorie Rank, MD  omeprazole (PRILOSEC) 20 MG capsule Take 1 capsule (20 mg total) by mouth daily. 10/26/18  Yes Dorie Rank, MD  amLODipine (NORVASC) 10 MG tablet Take 1 tablet (10  mg total) by mouth daily. 11/26/18   Rutherford Guys, MD  aspirin EC 325 MG EC tablet Take 1 tablet (325 mg total) by mouth 2 (two) times daily after a meal. Patient not taking: Reported on 11/26/2018 11/03/16   Loni Dolly, PA-C  atorvastatin (LIPITOR) 20 MG tablet Take 1 tablet (20 mg total) by mouth daily. 11/26/18   Rutherford Guys, MD  Blood Pressure Monitoring (ADULT BLOOD PRESSURE CUFF LG) KIT Check blood pressure daily and record readings. 10/05/16   [name], MD  Blood Pressure Monitoring (BLOOD PRESSURE KIT) DEVI 1 Device by Does not apply route daily. 11/02/18   Tenna Delaine D, PA-C  hydrochlorothiazide  (HYDRODIURIL) 25 MG tablet Take 1 tablet (25 mg total) by mouth daily. 11/26/18   Rutherford Guys, MD  lisinopril (PRINIVIL,ZESTRIL) 40 MG tablet Take 1 tablet (40 mg total) by mouth daily. 11/26/18   Rutherford Guys, MD    Family History Family History  Problem Relation Age of Onset  . Hypertension Mother   . Hypertension Father     Social History Social History   Tobacco Use  . Smoking status: Former Smoker    Packs/day: 0.50    Years: 34.00    Pack years: 17.00    Types: Cigarettes  . Smokeless tobacco: Never Used  . Tobacco comment: 2-3 weeks  Substance Use Topics  . Alcohol use: No  . Drug use: Yes    Types: Marijuana     Allergies   Patient has no known allergies.   Review of Systems Review of Systems  Constitutional: Negative for diaphoresis and fever.  Eyes: Negative for redness.  Respiratory: Negative for cough and shortness of breath.   Cardiovascular: Positive for chest pain and leg swelling. Negative for palpitations.  Gastrointestinal: Negative for abdominal pain, nausea and vomiting.  Genitourinary: Negative for dysuria.  Musculoskeletal: Negative for back pain and neck pain.  Skin: Negative for rash.  Neurological: Negative for syncope and light-headedness.  Psychiatric/Behavioral: The patient is not nervous/anxious.      Physical Exam Updated Vital Signs BP (!) 139/93   Pulse 68   Temp 97.9 F (36.6 C) (Oral)   Resp 20   Ht 5' 7.5" (1.715 m)   Wt 110.7 kg   SpO2 99%   BMI 37.65 kg/m   Physical Exam  Constitutional: He appears well-developed and well-nourished.  HENT:  Head: Normocephalic and atraumatic.  Mouth/Throat: Mucous membranes are normal. Mucous membranes are not dry.  Eyes: Conjunctivae are normal.  Neck: Trachea normal and normal range of motion. Neck supple. Normal carotid pulses and no JVD present. No muscular tenderness present. Carotid bruit is not present. No tracheal deviation present.  Cardiovascular: Normal rate,  regular rhythm, S1 normal, S2 normal, normal heart sounds and intact distal pulses. Exam reveals no distant heart sounds and no decreased pulses.  No murmur heard. Pulmonary/Chest: Effort normal and breath sounds normal. No respiratory distress. He has no wheezes. He exhibits no tenderness.  Abdominal: Soft. Normal aorta and bowel sounds are normal. There is no tenderness. There is no rebound and no guarding.  Musculoskeletal: He exhibits no edema.  Neurological: He is alert.  Skin: Skin is warm and dry. He is not diaphoretic. No cyanosis. No pallor.  Psychiatric: He has a normal mood and affect.  Nursing note and vitals reviewed.    ED Treatments / Results  Labs (all labs ordered are listed, but only abnormal results are displayed) Labs Reviewed  CBC -  Abnormal; Notable for the following components:      Result Value   MCH 24.4 (*)    RDW 15.8 (*)    All other components within normal limits  BASIC METABOLIC PANEL  TROPONIN I  TROPONIN I  TROPONIN I  I-STAT TROPONIN, ED    EKG EKG Interpretation  Date/Time:  Monday November 26 2018 15:10:27 EST Ventricular Rate:  67 PR Interval:  172 QRS Duration: 84 QT Interval:  398 QTC Calculation: 420 R Axis:   -15 Text Interpretation:  Normal sinus rhythm Minimal voltage criteria for LVH, may be normal variant T wave abnormality, consider lateral ischemia Abnormal ECG Confirmed by Fredia Sorrow [phone]) on 11/26/2018 4:39:03 PM   Radiology Dg Chest 2 View  Result Date: 11/26/2018 CLINICAL DATA:  Left chest and arm pain today.  Abnormal EKG. EXAM: CHEST - 2 VIEW COMPARISON:  PA and lateral chest 03/14/2018 and 02/15/2017. FINDINGS: Lung volumes are lower than on the comparison exams with mild basilar atelectasis. Heart size is upper normal. No pneumothorax or pleural fluid. No acute or focal bony abnormality. IMPRESSION: No acute disease. Electronically Signed   By: Inge Rise M.D.   On: 11/26/2018 14:46     Procedures Procedures (including critical care time)  Medications Ordered in ED Medications  clopidogrel (PLAVIX) tablet 75 mg (has no administration in time range)  clopidogrel (PLAVIX) tablet 300 mg (300 mg Oral Given 11/26/18 1732)     Initial Impression / Assessment and Plan / ED Course  I have reviewed the triage vital signs and the nursing notes.  Pertinent labs & imaging results that were available during my care of the patient were reviewed by me and considered in my medical decision making (see chart for details).     Patient seen and examined. Work-up initiated. Medications ordered. EKGs reviewed with Dr. Rogene Houston.   Vital signs reviewed and are as follows: BP (!) 139/93   Pulse 68   Temp 97.9 F (36.6 C) (Oral)   Resp 20   Ht 5' 7.5" (1.715 m)   Wt 110.7 kg   SpO2 99%   BMI 37.65 kg/m   Patient with concerning story, moderate risk heart score.  Initially spoke with Dr. Virgina Jock, Dr. Irven Shelling associate. Established patient is an unassigned cardiology patient.   Then spoke with Dr. Radford Pax. Pt okay for hospitalist admit, but states cardiology will consult.   Patient updated and agrees.  His only current complaint is that he is hungry.  Tray ordered.  6:30 PM Spoke with Dr. Roel Cluck who will see patient.   Final Clinical Impressions(s) / ED Diagnoses   Final diagnoses:  Precordial pain  Abnormal EKG   Admit.   ED Discharge Orders    None       Carlisle Cater, Hershal Coria 11/26/18 1831    Fredia Sorrow, MD 11/27/18 [phone]

## 2018-11-26 NOTE — ED Notes (Signed)
Attempted report x1. RN unavailable. 

## 2018-11-26 NOTE — Progress Notes (Signed)
12/9/201912:24 PM  Thomas Yoder Feb 28, 1961, 57 y.o. male [id number]  Chief Complaint  Patient presents with  . Hypertension    speak with pt on the impotance of taking medcation daily, has yet to pick up the lipitor. Needs clearance form completed from dentist. Not able to do extaction due to high bp    HPI:   Patient is a 57 y.o. male with past medical history significant for HTN, DM2, depression who presents today for BP followup  Last OV with in NOV 2019 with Timmothy Euler Currently taking HCTZ 12.47m, lisinopril 253mAlso takes metformin 50067mnce a day Unable to pickup BP meds as he had to leave out of town  Needs to have dental work - teeth extraction, broken teeth  Having some SOB and chest tightness radiating towards left arm that started today Denies headache, vision changes, diaphoresis, nausea, vomiting, abd pain, cough  Lab Results  Component Value Date   HGBA1C 6.5 (A) 10/18/2018   HGBA1C 6.7 02/15/2017   HGBA1C 6.1 (H) 10/05/2016   Lab Results  Component Value Date   MICROALBUR 0.8 10/05/2016   LDLCALC 105 (H) 10/18/2018   CREATININE 1.04 10/26/2018    Fall Risk  11/26/2018 10/18/2018 02/15/2017 10/05/2016  Falls in the past year? 0 No No No     Dep[facility] screen [facility] 11/26/2018 10/18/2018 02/15/2017  Decreased Interest 0 0 0  Down, Depressed, Hopeless 0 0 0  PHQ - 2 Score 0 0 0  Altered sleeping - - -  Tired, decreased energy - - -  Change in appetite - - -  Feeling bad or failure about yourself  - - -  Trouble concentrating - - -  Moving slowly or fidgety/restless - - -  Suicidal thoughts - - -  PHQ-9 Score - - -  Difficult doing work/chores - - -    No Known Allergies  Prior to Admission medications   Medication Sig Start Date End Date Taking? Authorizing Provider  Blood Pressure Monitoring (ADULT BLOOD PRESSURE CUFF LG) KIT Check blood pressure daily and record readings. 10/05/16  Yes StaForrest MoronD  Blood Pressure Monitoring (BLOOD  PRESSURE KIT) DEVI 1 Device by Does not apply route daily. 11/02/18  Yes WisTimmothy EulerriT[place] PA-C  hydrochlorothiazide (HYDRODIURIL) 25 MG tablet Take 1 tablet (25 mg total) by mouth daily. 11/02/18  Yes WisTenna Delaine PA-C  metFORMIN (GLUCOPHAGE) 500 MG tablet Take 1 tablet (500 mg total) by mouth daily with breakfast. 10/18/18  Yes WisLeonie DouglasA-C  aspirin EC 325 MG EC tablet Take 1 tablet (325 mg total) by mouth 2 (two) times daily after a meal. Patient not taking: Reported on 11/26/2018 11/03/16   NidLoni DollyA-C  atorvastatin (LIPITOR) 20 MG tablet Take 1 tablet (20 mg total) by mouth daily. Patient not taking: Reported on 11/26/2018 10/23/18   WisTenna Delaine PA-C  dicyclomine (BENTYL) 20 MG tablet Take 1 tablet (20 mg total) by mouth 3 (three) times daily as needed for spasms. Patient not taking: Reported on 11/26/2018 10/26/18   KnaDorie RankD  lisinopril (PRINIVIL,ZESTRIL) 40 MG tablet Take 1 tablet (40 mg total) by mouth daily. Patient not taking: Reported on 11/26/2018 11/02/18   WisTenna Delaine PA-C  naproxen (NAPROSYN) 500 MG tablet Take 1 tablet (500 mg total) by mouth 2 (two) times daily with a meal. As needed for pain Patient not taking: Reported on 11/26/2018 10/26/18   KnaDorie RankD  omeprazole (PRILOSEC) 20 MG capsule  Take 1 capsule (20 mg total) by mouth daily. Patient not taking: Reported on 11/26/2018 10/26/18   Dorie Rank, MD    Past Medical History:  Diagnosis Date  . Depression   . Diabetes mellitus without complication ([place])   . Hypertension     Past Surgical History:  Procedure Laterality Date  . KNEE SURGERY    . TOTAL KNEE ARTHROPLASTY Left 11/01/2016   Procedure: TOTAL KNEE ARTHROPLASTY;  Surgeon: Melrose Nakayama, MD;  Location: [place];  Service: Orthopedics;  Laterality: Left;    Social History   Tobacco Use  . Smoking status: Current Every Day Smoker    Packs/day: 0.50    Years: 3.00    Pack years: 1.50    Types: Cigarettes  .  Smokeless tobacco: Never Used  Substance Use Topics  . Alcohol use: No    Family History  Problem Relation Age of Onset  . Hypertension Mother   . Hypertension Father     ROS Per hpi  OBJECTIVE:  Blood pressure (!) 174/135, pulse 74, height 5' 7.5" (1.715 m), weight 245 lb (111.1 kg), SpO2 95 %. Body mass index is 37.81 kg/m.   Physical Exam  Constitutional: He is oriented to person, place, and time. He appears well-developed and well-nourished.  HENT:  Head: Normocephalic and atraumatic.  Mouth/Throat: Oropharynx is clear and moist.  Eyes: Pupils are equal, round, and reactive to light. Conjunctivae and EOM are normal.  Neck: Neck supple.  Cardiovascular: Normal rate and regular rhythm. Exam reveals no gallop and no friction rub.  No murmur heard. Pulmonary/Chest: Effort normal and breath sounds normal. He has no wheezes. He has no rales.  Musculoskeletal: He exhibits no edema.  Neurological: He is alert and oriented to person, place, and time.  Skin: Skin is warm and dry.  Psychiatric: He has a normal mood and affect.  Nursing note and vitals reviewed.   Given ASA 325 @ 1259 Started on O2 @ 1259  My interpretation of EKG:  Sinus, HR 66, new Twave inversion in I, II and AVL, V4 and V5  ASSESSMENT and PLAN  1. T wave inversion in EKG Concerning new ischemic changes in patient with symptoms and risk factors for CAD. Sent to ER with EMS for further eval/risk stratification  2. Chest tightness  3. SOB (shortness of breath)  4. Essential hypertension Uncontrolled. Patient took second tab of lisinopril 60m and hctz 12.536mwhile in clinic, to max dose. Also adding amlodipine 1058m - EKG 12-Lead  5. Type 2 diabetes mellitus with complication, without long-term current use of insulin (HCC) Controlled. Cont current regime  Other orders - atorvastatin (LIPITOR) 20 MG tablet; Take 1 tablet (20 mg total) by mouth daily. - lisinopril (PRINIVIL,ZESTRIL) 40 MG tablet;  Take 1 tablet (40 mg total) by mouth daily. - hydrochlorothiazide (HYDRODIURIL) 25 MG tablet; Take 1 tablet (25 mg total) by mouth daily. - amLODipine (NORVASC) 10 MG tablet; Take 1 tablet (10 mg total) by mouth daily.  Return for after er/hosp.    IrmRutherford GuysD [facility] [id number].  [phone]

## 2018-11-26 NOTE — Patient Instructions (Addendum)
Blood pressure medications: 1. Lisinopril 40mg  once a day  (in the morning) 2. HCTZ 25mg  once a day (in the morning) 3. Amlodipine 10 mg once a day (at night)   Check BP at home daily   Managing Your Hypertension Hypertension is commonly called high blood pressure. This is when the force of your blood pressing against the walls of your arteries is too strong. Arteries are blood vessels that carry blood from your heart throughout your body. Hypertension forces the heart to work harder to pump blood, and may cause the arteries to become narrow or stiff. Having untreated or uncontrolled hypertension can cause heart attack, stroke, kidney disease, and other problems. What are blood pressure readings? A blood pressure reading consists of a higher number over a lower number. Ideally, your blood pressure should be below 120/80. The first ("top") number is called the systolic pressure. It is a measure of the pressure in your arteries as your heart beats. The second ("bottom") number is called the diastolic pressure. It is a measure of the pressure in your arteries as the heart relaxes. What does my blood pressure reading mean? Blood pressure is classified into four stages. Based on your blood pressure reading, your health care provider may use the following stages to determine what type of treatment you need, if any. Systolic pressure and diastolic pressure are measured in a unit called mm Hg. Normal  Systolic pressure: below 120.  Diastolic pressure: below 80. Elevated  Systolic pressure: 120-129.  Diastolic pressure: below 80. Hypertension stage 1  Systolic pressure: 130-139.  Diastolic pressure: 80-89. Hypertension stage 2  Systolic pressure: 140 or above.  Diastolic pressure: 90 or above. What health risks are associated with hypertension? Managing your hypertension is an important responsibility. Uncontrolled hypertension can lead to:  A heart attack.  A stroke.  A weakened  blood vessel (aneurysm).  Heart failure.  Kidney damage.  Eye damage.  Metabolic syndrome.  Memory and concentration problems.  What changes can I make to manage my hypertension? Hypertension can be managed by making lifestyle changes and possibly by taking medicines. Your health care provider will help you make a plan to bring your blood pressure within a normal range. Eating and drinking  Eat a diet that is high in fiber and potassium, and low in salt (sodium), added sugar, and fat. An example eating plan is called the DASH (Dietary Approaches to Stop Hypertension) diet. To eat this way: ? Eat plenty of fresh fruits and vegetables. Try to fill half of your plate at each meal with fruits and vegetables. ? Eat whole grains, such as whole wheat pasta, brown rice, or whole grain bread. Fill about one quarter of your plate with whole grains. ? Eat low-fat diary products. ? Avoid fatty cuts of meat, processed or cured meats, and poultry with skin. Fill about one quarter of your plate with lean proteins such as fish, chicken without skin, beans, eggs, and tofu. ? Avoid premade and processed foods. These tend to be higher in sodium, added sugar, and fat.  Reduce your daily sodium intake. Most people with hypertension should eat less than 1,500 mg of sodium a day.  Limit alcohol intake to no more than 1 drink a day for nonpregnant women and 2 drinks a day for men. One drink equals 12 oz of beer, 5 oz of wine, or 1 oz of hard liquor. Lifestyle  Work with your health care provider to maintain a healthy body weight, or to lose weight.  Ask what an ideal weight is for you.  Get at least 30 minutes of exercise that causes your heart to beat faster (aerobic exercise) most days of the week. Activities may include walking, swimming, or biking.  Include exercise to strengthen your muscles (resistance exercise), such as weight lifting, as part of your weekly exercise routine. Try to do these types of  exercises for 30 minutes at least 3 days a week.  Do not use any products that contain nicotine or tobacco, such as cigarettes and e-cigarettes. If you need help quitting, ask your health care provider.  Control any long-term (chronic) conditions you have, such as high cholesterol or diabetes. Monitoring  Monitor your blood pressure at home as told by your health care provider. Your personal target blood pressure may vary depending on your medical conditions, your age, and other factors.  Have your blood pressure checked regularly, as often as told by your health care provider. Working with your health care provider  Review all the medicines you take with your health care provider because there may be side effects or interactions.  Talk with your health care provider about your diet, exercise habits, and other lifestyle factors that may be contributing to hypertension.  Visit your health care provider regularly. Your health care provider can help you create and adjust your plan for managing hypertension. Will I need medicine to control my blood pressure? Your health care provider may prescribe medicine if lifestyle changes are not enough to get your blood pressure under control, and if:  Your systolic blood pressure is 130 or higher.  Your diastolic blood pressure is 80 or higher.  Take medicines only as told by your health care provider. Follow the directions carefully. Blood pressure medicines must be taken as prescribed. The medicine does not work as well when you skip doses. Skipping doses also puts you at risk for problems. Contact a health care provider if:  You think you are having a reaction to medicines you have taken.  You have repeated (recurrent) headaches.  You feel dizzy.  You have swelling in your ankles.  You have trouble with your vision. Get help right away if:  You develop a severe headache or confusion.  You have unusual weakness or numbness, or you feel  faint.  You have severe pain in your chest or abdomen.  You vomit repeatedly.  You have trouble breathing. Summary  Hypertension is when the force of blood pumping through your arteries is too strong. If this condition is not controlled, it may put you at risk for serious complications.  Your personal target blood pressure may vary depending on your medical conditions, your age, and other factors. For most people, a normal blood pressure is less than 120/80.  Hypertension is managed by lifestyle changes, medicines, or both. Lifestyle changes include weight loss, eating a healthy, low-sodium diet, exercising more, and limiting alcohol. This information is not intended to replace advice given to you by your health care provider. Make sure you discuss any questions you have with your health care provider. Document Released: 08/29/2012 Document Revised: 11/02/2016 Document Reviewed: 11/02/2016 Elsevier Interactive Patient Education  [employer].   If you have lab work done today you will be contacted with your lab results within the next 2 weeks.  If you have not heard from [place] then please contact us. The fastest way to get your results is to register for My Chart.   IF you received an x-ray today, you will receive  an Economist from [facility]. Please contact [facility] at [phone] with questions or concerns regarding your invoice.   IF you received labwork today, you will receive an invoice from [facility]. Please contact [facility] at [phone] with questions or concerns regarding your invoice.   Our billing staff will not be able to assist you with questions regarding bills from these companies.  You will be contacted with the lab results as soon as they are available. The fastest way to get your results is to activate your My Chart account. Instructions are located on the last page of this paperwork. If you have not heard from [place] regarding the results in 2  weeks, please contact this office.

## 2018-11-26 NOTE — ED Triage Notes (Signed)
Cp since last week , pain in neck and left arm that comes and goes, went to dr today ane was sent by drwas given 324 asa but refused  ntg

## 2018-11-26 NOTE — ED Notes (Signed)
Gave pt crackers and peanut butter

## 2018-11-27 ENCOUNTER — Observation Stay (HOSPITAL_BASED_OUTPATIENT_CLINIC_OR_DEPARTMENT_OTHER): Payer: BC Managed Care – PPO

## 2018-11-27 ENCOUNTER — Observation Stay (HOSPITAL_COMMUNITY): Payer: BC Managed Care – PPO

## 2018-11-27 DIAGNOSIS — R079 Chest pain, unspecified: Secondary | ICD-10-CM

## 2018-11-27 DIAGNOSIS — I10 Essential (primary) hypertension: Secondary | ICD-10-CM | POA: Diagnosis not present

## 2018-11-27 DIAGNOSIS — E1169 Type 2 diabetes mellitus with other specified complication: Secondary | ICD-10-CM

## 2018-11-27 DIAGNOSIS — Z6837 Body mass index (BMI) 37.0-37.9, adult: Secondary | ICD-10-CM

## 2018-11-27 DIAGNOSIS — R9431 Abnormal electrocardiogram [ECG] [EKG]: Secondary | ICD-10-CM

## 2018-11-27 DIAGNOSIS — E119 Type 2 diabetes mellitus without complications: Secondary | ICD-10-CM | POA: Diagnosis not present

## 2018-11-27 DIAGNOSIS — R0683 Snoring: Secondary | ICD-10-CM | POA: Diagnosis not present

## 2018-11-27 LAB — GLUCOSE, CAPILLARY
GLUCOSE-CAPILLARY: 112 mg/dL — AB (ref 70–99)
Glucose-Capillary: 117 mg/dL — ABNORMAL HIGH (ref 70–99)
Glucose-Capillary: 128 mg/dL — ABNORMAL HIGH (ref 70–99)
Glucose-Capillary: 132 mg/dL — ABNORMAL HIGH (ref 70–99)
Glucose-Capillary: 144 mg/dL — ABNORMAL HIGH (ref 70–99)
Glucose-Capillary: 154 mg/dL — ABNORMAL HIGH (ref 70–99)

## 2018-11-27 LAB — LIPID PANEL
Cholesterol: 204 mg/dL — ABNORMAL HIGH (ref 0–200)
HDL: 25 mg/dL — ABNORMAL LOW (ref >40)
LDL Cholesterol: 106 mg/dL — ABNORMAL HIGH (ref 0–99)
Total CHOL/HDL Ratio: 8.2 RATIO
Triglycerides: 364 mg/dL — ABNORMAL HIGH (ref <150)
VLDL: 73 mg/dL — ABNORMAL HIGH (ref 0–40)

## 2018-11-27 LAB — TROPONIN I
Troponin I: <0.03 ng/mL (ref <0.03)
Troponin I: <0.03 ng/mL (ref <0.03)

## 2018-11-27 LAB — ECHOCARDIOGRAM COMPLETE
Height: 67 in
Weight: 3872 oz

## 2018-11-27 MED ORDER — INSULIN ASPART 100 UNIT/ML ~~LOC~~ SOLN
0.0000 [IU] | Freq: Three times a day (TID) | SUBCUTANEOUS | Status: DC
  Administered 2018-11-28 (×2): 1 [IU] via SUBCUTANEOUS

## 2018-11-27 MED ORDER — NITROGLYCERIN 0.4 MG SL SUBL
0.8000 mg | SUBLINGUAL_TABLET | Freq: Once | SUBLINGUAL | Status: AC
  Administered 2018-11-27: 0.8 mg via SUBLINGUAL

## 2018-11-27 MED ORDER — IOPAMIDOL (ISOVUE-370) INJECTION 76%
100.0000 mL | Freq: Once | INTRAVENOUS | Status: AC | PRN
  Administered 2018-11-27: 100 mL via INTRAVENOUS

## 2018-11-27 MED ORDER — NITROGLYCERIN 0.4 MG SL SUBL
SUBLINGUAL_TABLET | SUBLINGUAL | Status: AC
  Filled 2018-11-27: qty 2

## 2018-11-27 NOTE — Progress Notes (Signed)
Coronary CTA showed Coronary artery calcium score 157 Agatston units. This places the patient in the 83rd percentile, suggesting high risk for futurecardiac events.  There appeared to be mild stenosis in the proximal LCx and the proximal and mid RCA.  FFR pending.  Needs aggressive risk factor modification.  Continue ASA 81mg  daily and Lipitor.  Needs tight BP control.  Continue HCTZ, BB and ACE I and increase Lopressor as BP tolerates.

## 2018-11-27 NOTE — Progress Notes (Signed)
  Echocardiogram 2D Echocardiogram has been performed.  Thomas Yoder 11/27/2018, 11:55 AM

## 2018-11-27 NOTE — Progress Notes (Signed)
Dr. Shirlee Latch responded OK to request to read CT today Emelly Wurtz PA-C

## 2018-11-27 NOTE — Progress Notes (Signed)
Trops are all negative.  Plan for coronary CTA today to define coronary anatomy.  Needs outpt sleep study for nocturnal hypoxemia.

## 2018-11-27 NOTE — Progress Notes (Signed)
Patient is snoring and apneic with mouth breathing. 02 drops to 75 and back up 90s when aroused, placed on 02 2L and back up to high 90s.

## 2018-11-27 NOTE — Progress Notes (Addendum)
Progress Note    Thomas Yoder  LID:[id number] DOB: 1961/05/11  DOA: 11/26/2018 PCP: Doristine Bosworth, MD    Brief Narrative:     Medical records reviewed and are as summarized below:  Thomas Yoder is an 57 y.o. male with medical history significant of HTN, DM2, depression, HLD abnormal stress test  Admitted for Chest pain evaluation.  CTA read as high risk coronary events.  Echo done with decreased EF  Assessment/Plan:   Active Problems:   Essential hypertension, benign   Chest pain   Snoring   DM (diabetes mellitus), type 2 (HCC)  Chest pain  -echo: Left ventricle: The cavity size was normal. There was mild   concentric hypertrophy. Systolic function was mildly to   moderately reduced. The estimated ejection fraction was in the   range of 40% to 45%. Mild diffuse hypokinesis with no   identifiable regional variations. Doppler parameters are   consistent with abnormal left ventricular relaxation (grade 1   diastolic dysfunction). Indeterminate mean left atrial filling   pressure. -LDL 106 -CTA: high risk for future cardiac events but getting FFR -cardiology consulted and await further recommendations   Incidental lung nodule -will need outpatient follow up  Essential hypertension -restart home medications-- adjust as needed  Suspected OSA -will need outpatient sleep study  Tobacco abuse -quit 2 weeks ago  Dm 2  -SSI  -hgbA1c: 6.4  obesity Body mass index is 37.9 kg/m.   Family Communication/Anticipated D/C date and plan/Code Status   DVT prophylaxis: Lovenox ordered. Code Status: Full Code.  Family Communication:  Disposition Plan:    Medical Consultants:   Cardiology   Subjective:   Wearing O2 because his saturations went down while he was sleeping  Objective:    Vitals:   11/27/18 0419 11/27/18 0428 11/27/18 0833 11/27/18 1212  BP:  (!) 159/65 (!) 152/103 (!) 144/99  Pulse:  64 67 63  Resp:  20 18 19   Temp:  98.6 F (37 C)  98.2 F (36.8 C) (!) 97.5 F (36.4 C)  TempSrc:  Oral Oral Oral  SpO2: 100% 98% 99% 100%  Weight:  109.8 kg    Height:        Intake/Output Summary (Last 24 hours) at 11/27/2018 1527 Last data filed at 11/27/2018 0930 Gross per 24 hour  Intake 120 ml  Output -  Net 120 ml   Filed Weights   11/26/18 1416 11/26/18 2146 11/27/18 0428  Weight: 110.7 kg 111.4 kg 109.8 kg    Exam: In bed, O2 on Nasal congestion rrr Diminished breath sounds +BS, obese abd Min LE Edema  Data Reviewed:   I have personally reviewed following labs and imaging studies:  Labs: Labs show the following:   Basic Metabolic Panel: Recent Labs  Lab 11/26/18 1423  NA 138  K 4.3  CL 103  CO2 25  GLUCOSE 90  BUN 19  CREATININE 1.20  CALCIUM 9.4   GFR Estimated Creatinine Clearance: 81.3 mL/min (by C-G formula based on SCr of 1.2 mg/dL). Liver Function Tests: No results for input(s): AST, ALT, ALKPHOS, BILITOT, PROT, ALBUMIN in the last 168 hours. No results for input(s): LIPASE, AMYLASE in the last 168 hours. No results for input(s): AMMONIA in the last 168 hours. Coagulation profile No results for input(s): INR, PROTIME in the last 168 hours.  CBC: Recent Labs  Lab 11/26/18 1423  WBC 5.5  HGB 14.1  HCT 47.0  MCV 81.5  PLT 222   Cardiac Enzymes:  Recent Labs  Lab 11/26/18 2230 11/27/18 0028 11/27/18 0634  TROPONINI <0.03 <0.03 <0.03   BNP (last 3 results) No results for input(s): PROBNP in the last 8760 hours. CBG: Recent Labs  Lab 11/26/18 2147 11/27/18 0027 11/27/18 0412 11/27/18 0749 11/27/18 1259  GLUCAP 169* 154* 144* 112* 132*   D-Dimer: No results for input(s): DDIMER in the last 72 hours. Hgb A1c: Recent Labs    11/26/18 2230  HGBA1C 6.4*   Lipid Profile: Recent Labs    11/27/18 0028  CHOL 204*  HDL 25*  LDLCALC 106*  TRIG 364*  CHOLHDL 8.2   Thyroid function studies: No results for input(s): TSH, T4TOTAL, T3FREE, THYROIDAB in the last 72  hours.  Invalid input(s): FREET3 Anemia work up: No results for input(s): VITAMINB12, FOLATE, FERRITIN, TIBC, IRON, RETICCTPCT in the last 72 hours. Sepsis Labs: Recent Labs  Lab 11/26/18 1423  WBC 5.5    Microbiology No results found for this or any previous visit (from the past 240 hour(s)).  Procedures and diagnostic studies:  Dg Chest 2 View  Result Date: 11/26/2018 CLINICAL DATA:  Left chest and arm pain today.  Abnormal EKG. EXAM: CHEST - 2 VIEW COMPARISON:  PA and lateral chest 03/14/2018 and 02/15/2017. FINDINGS: Lung volumes are lower than on the comparison exams with mild basilar atelectasis. Heart size is upper normal. No pneumothorax or pleural fluid. No acute or focal bony abnormality. IMPRESSION: No acute disease. Electronically Signed   By: Drusilla Kanner M.D.   On: 11/26/2018 14:46   Ct Coronary Morph W/cta Cor W/score W/ca W/cm &/or Wo/cm  Addendum Date: 11/27/2018   ADDENDUM REPORT: 11/27/2018 14:36 CLINICAL DATA:  Chest pain EXAM: Cardiac CTA MEDICATIONS: Sub lingual nitro. 4mg  x 2 TECHNIQUE: The patient was scanned on a Siemens 192 slice scanner. Gantry rotation speed was 250 msecs. Collimation was 0.6 mm. A 100 kV prospective scan was triggered in the ascending thoracic aorta at 35-75% of the R-R interval. Average HR during the scan was 60 bpm. The 3D data set was interpreted on a dedicated work station using MPR, MIP and VRT modes. A total of 80cc of contrast was used. FINDINGS: Non-cardiac: See separate report from [facility]. Pulmonary veins drained normally to the left atrium. Calcium Score: 157 Agatston units. Coronary Arteries: Right dominant with no anomalies LM: No plaque or stenosis. LAD system: Mixed plaque in the proximal LAD, no significant stenosis. Circumflex system: Mixed plaque in the proximal LCx, suspect mild stenosis (<50%). RCA system: Mixed plaque with mild stenosis (<50%) in the proximal RCA. Mixed plaque with mild stenosis in the mid  RCA. IMPRESSION: 1. Coronary artery calcium score 157 Agatston units. This places the patient in the 83rd percentile, suggesting high risk for future cardiac events. 2. Mild stenosis in the proximal LCx and the proximal and mid RCA. Will confirm with FFR. Dalton Mclean Electronically Signed   By: Marca Ancona M.D.   On: 11/27/2018 14:36   Result Date: 11/27/2018 EXAM: OVER-READ INTERPRETATION  CT CHEST The following report is an over-read performed by radiologist Dr. Genevive Bi of [facility] on 11/27/2018. This over-read does not include interpretation of cardiac or coronary anatomy or pathology. The coronary CTA interpretation by the cardiologist is attached. COMPARISON:  None. FINDINGS: Limited view of the lung parenchyma demonstrates 5 mm noncalcified nodule within the superior aspect of the LEFT lower lobe (image 7/12). Airways are normal. Limited view of the mediastinum demonstrates no adenopathy. Esophagus normal. Limited view of the  upper abdomen unremarkable. Limited view of the skeleton and chest wall is unremarkable. Degenerative osteophytosis of the spine. IMPRESSION: 1. LEFT lobe pulmonary nodule. No follow-up needed if patient is low-risk. Non-contrast chest CT can be considered in 12 months if patient is high-risk. This recommendation follows the consensus statement: Guidelines for Management of Incidental Pulmonary Nodules Detected on CT Images: From the Fleischner Society 2017; Radiology 2017; 284:228-243. 2.  Degenerative osteophytosis of the spine. These results will be called to the ordering clinician or representative by the Radiologist Assistant, and communication documented in the PACS or zVision Dashboard. Electronically Signed: By: Genevive Bi M.D. On: 11/27/2018 14:15    Medications:   . aspirin  81 mg Oral Daily  . atorvastatin  20 mg Oral Daily  . enoxaparin (LOVENOX) injection  40 mg Subcutaneous Q24H  . hydrochlorothiazide  25 mg Oral Daily  .  insulin aspart  0-9 Units Subcutaneous Q4H  . lisinopril  20 mg Oral BID  . metoprolol tartrate  25 mg Oral UD  . nitroGLYCERIN      . pantoprazole  40 mg Oral Daily   Continuous Infusions:   LOS: 0 days   Joseph Art  [facility]   *Please refer to [email or web address], password [email or web address] to get updated schedule on who will round on this patient, as hospitalists switch teams weekly. If 7PM-7AM, please contact night-coverage at [email or web address], password [email or web address] for any overnight needs.  11/27/2018, 3:27 PM

## 2018-11-27 NOTE — Plan of Care (Signed)

## 2018-11-27 NOTE — Progress Notes (Signed)
continuous pulse ox on SATs 96-100 dropped to 87 then back up.

## 2018-11-28 ENCOUNTER — Encounter (HOSPITAL_COMMUNITY): Payer: Self-pay | Admitting: Internal Medicine

## 2018-11-28 ENCOUNTER — Telehealth: Payer: Self-pay | Admitting: Cardiology

## 2018-11-28 DIAGNOSIS — E119 Type 2 diabetes mellitus without complications: Secondary | ICD-10-CM | POA: Diagnosis not present

## 2018-11-28 DIAGNOSIS — I10 Essential (primary) hypertension: Secondary | ICD-10-CM | POA: Diagnosis not present

## 2018-11-28 DIAGNOSIS — E669 Obesity, unspecified: Secondary | ICD-10-CM | POA: Diagnosis present

## 2018-11-28 DIAGNOSIS — R0683 Snoring: Secondary | ICD-10-CM | POA: Diagnosis not present

## 2018-11-28 DIAGNOSIS — R079 Chest pain, unspecified: Secondary | ICD-10-CM | POA: Diagnosis not present

## 2018-11-28 DIAGNOSIS — E1169 Type 2 diabetes mellitus with other specified complication: Secondary | ICD-10-CM | POA: Diagnosis not present

## 2018-11-28 LAB — GLUCOSE, CAPILLARY
Glucose-Capillary: 106 mg/dL — ABNORMAL HIGH (ref 70–99)
Glucose-Capillary: 138 mg/dL — ABNORMAL HIGH (ref 70–99)
Glucose-Capillary: 131 mg/dL — ABNORMAL HIGH (ref 70–99)

## 2018-11-28 LAB — BASIC METABOLIC PANEL
Anion gap: 10 (ref 5–15)
BUN: 13 mg/dL (ref 6–20)
CHLORIDE: 102 mmol/L (ref 98–111)
CO2: 26 mmol/L (ref 22–32)
CREATININE: 1.05 mg/dL (ref 0.61–1.24)
Calcium: 8.8 mg/dL — ABNORMAL LOW (ref 8.9–10.3)
GFR calc Af Amer: >60 mL/min (ref >60)
GFR calc non Af Amer: >60 mL/min (ref >60)
GLUCOSE: 103 mg/dL — AB (ref 70–99)
Potassium: 3.8 mmol/L (ref 3.5–5.1)
SODIUM: 138 mmol/L (ref 135–145)

## 2018-11-28 LAB — CBC
HCT: 45.6 % (ref 39.0–52.0)
Hemoglobin: 13.9 g/dL (ref 13.0–17.0)
MCH: 24.3 pg — ABNORMAL LOW (ref 26.0–34.0)
MCHC: 30.5 g/dL (ref 30.0–36.0)
MCV: 79.9 fL — ABNORMAL LOW (ref 80.0–100.0)
PLATELETS: 201 10*3/uL (ref 150–400)
RBC: 5.71 MIL/uL (ref 4.22–5.81)
RDW: 15.6 % — ABNORMAL HIGH (ref 11.5–15.5)
WBC: 5.5 10*3/uL (ref 4.0–10.5)
nRBC: 0 % (ref 0.0–0.2)

## 2018-11-28 LAB — TROPONIN I
Troponin I: <0.03 ng/mL (ref <0.03)
Troponin I: <0.03 ng/mL (ref <0.03)

## 2018-11-28 MED ORDER — METOPROLOL TARTRATE 25 MG PO TABS: 25.0000 mg | ORAL_TABLET | Freq: Two times a day (BID) | ORAL | Status: DC

## 2018-11-28 MED ORDER — METOPROLOL SUCCINATE ER 50 MG PO TB24: 50.0000 mg | ORAL_TABLET | Freq: Every day | ORAL | Status: DC

## 2018-11-28 MED ORDER — AMLODIPINE BESYLATE 5 MG PO TABS: 5.0000 mg | ORAL_TABLET | Freq: Every day | ORAL | 1 refills | Status: DC

## 2018-11-28 MED ORDER — AMLODIPINE BESYLATE 2.5 MG PO TABS
5.0000 mg | ORAL_TABLET | Freq: Every day | ORAL | Status: DC
  Administered 2018-11-28: 5 mg via ORAL
  Filled 2018-11-28: qty 2

## 2018-11-28 MED ORDER — ASPIRIN 81 MG PO TBEC: 81.0000 mg | DELAYED_RELEASE_TABLET | Freq: Every day | ORAL | Status: DC

## 2018-11-28 MED ORDER — METOPROLOL TARTRATE 25 MG PO TABS: 25.0000 mg | ORAL_TABLET | Freq: Two times a day (BID) | ORAL | 1 refills | Status: DC

## 2018-11-28 MED ORDER — ATORVASTATIN CALCIUM 80 MG PO TABS: 80.0000 mg | ORAL_TABLET | Freq: Every day | ORAL | 11 refills | Status: DC

## 2018-11-28 MED ORDER — METOPROLOL SUCCINATE ER 50 MG PO TB24: 50.0000 mg | ORAL_TABLET | Freq: Every day | ORAL | 1 refills | Status: DC

## 2018-11-28 MED ORDER — ATORVASTATIN CALCIUM 40 MG PO TABS: 40.0000 mg | ORAL_TABLET | Freq: Every day | ORAL | Status: DC

## 2018-11-28 MED ORDER — ATORVASTATIN CALCIUM 40 MG PO TABS: 40.0000 mg | ORAL_TABLET | Freq: Every day | ORAL | 1 refills | Status: DC

## 2018-11-28 NOTE — Telephone Encounter (Signed)
**Note De-identified Thomas Yoder Obfuscation** The pt is being discharged from the hospital today. We will call him tomorrow 

## 2018-11-28 NOTE — Progress Notes (Addendum)
Patient complains of 6/10 chest pain @ 0920. Vitals and EKG obtained. Attending and Cardiology text paged by charge RN.  [phone]: Primary RN made aware.

## 2018-11-28 NOTE — Progress Notes (Signed)
Pt is asleep with 4L [place] on. When pt starts snoring pts O2 drops down into the high 70s. O2 comes back up when pt stops snoring

## 2018-11-28 NOTE — Progress Notes (Signed)
11/28/18  1710  Reviewed discharge instructions with patient. Patient verbalized understanding of discharge instructions. Copy of discharge instructions given to patient.

## 2018-11-28 NOTE — Progress Notes (Signed)
Progress Note  Patient Name: Thomas Yoder Date of Encounter: 11/28/2018  Primary Cardiologist: Armanda Magic, MD   Subjective   Patient reported an episode of chest discomfort this morning while eating breakfast. Resolved without intervention. Denies SOB or palpitations  Inpatient Medications    Scheduled Meds: . amLODipine  5 mg Oral Daily  . aspirin  81 mg Oral Daily  . [START ON 11/29/2018] atorvastatin  40 mg Oral Daily  . enoxaparin (LOVENOX) injection  40 mg Subcutaneous Q24H  . hydrochlorothiazide  25 mg Oral Daily  . insulin aspart  0-9 Units Subcutaneous TID WC  . lisinopril  20 mg Oral BID  . metoprolol tartrate  25 mg Oral BID  . pantoprazole  40 mg Oral Daily   Continuous Infusions:  PRN Meds: acetaminophen, morphine injection, ondansetron (ZOFRAN) IV   Vital Signs    Vitals:   11/28/18 0418 11/28/18 0927 11/28/18 1101 11/28/18 1217  BP: (!) 143/95 119/68 (!) 154/93 (!) 141/81  Pulse: 60 71 64 65  Resp: 20 18 20 18   Temp: 98.1 F (36.7 C)   98.3 F (36.8 C)  TempSrc: Oral   Oral  SpO2: 94% 91% 98% 95%  Weight: 108.6 kg     Height:        Intake/Output Summary (Last 24 hours) at 11/28/2018 1421 Last data filed at 11/28/2018 1244 Gross per 24 hour  Intake 840 ml  Output 900 ml  Net -60 ml   Filed Weights   11/26/18 2146 11/27/18 0428 11/28/18 0418  Weight: 111.4 kg 109.8 kg 108.6 kg    Telemetry    NSR - Personally Reviewed  ECG    EKG at the time of chest pain with NSR, lateral TWI (seen on prior), no STE/D - Personally Reviewed  Physical Exam   GEN: Laying in bed in no acute distress.   Neck: No JVD, no carotid bruits Cardiac: RRR, no murmurs, rubs, or gallops.  Respiratory: Clear to auscultation bilaterally, no wheezes/ rales/ rhonchi GI: NABS, soft, obese, nontender, non-distended  MS: No edema; No deformity. Neuro:  Nonfocal, moving all extremities spontaneously Psych: Normal affect   Labs    Chemistry Recent Labs  Lab  11/26/18 1423 11/28/18 0533  NA 138 138  K 4.3 3.8  CL 103 102  CO2 25 26  GLUCOSE 90 103*  BUN 19 13  CREATININE 1.20 1.05  CALCIUM 9.4 8.8*  GFRNONAA >60 >60  GFRAA >60 >60  ANIONGAP 10 10     Hematology Recent Labs  Lab 11/26/18 1423 11/28/18 0533  WBC 5.5 5.5  RBC 5.77 5.71  HGB 14.1 13.9  HCT 47.0 45.6  MCV 81.5 79.9*  MCH 24.4* 24.3*  MCHC 30.0 30.5  RDW 15.8* 15.6*  PLT 222 201    Cardiac Enzymes Recent Labs  Lab 11/26/18 2230 11/27/18 0028 11/27/18 0634 11/28/18 1008  TROPONINI <0.03 <0.03 <0.03 <0.03    Recent Labs  Lab 11/26/18 1429  TROPIPOC 0.01     BNPNo results for input(s): BNP, PROBNP in the last 168 hours.   DDimer No results for input(s): DDIMER in the last 168 hours.   Radiology    Dg Chest 2 View  Result Date: 11/26/2018 CLINICAL DATA:  Left chest and arm pain today.  Abnormal EKG. EXAM: CHEST - 2 VIEW COMPARISON:  PA and lateral chest 03/14/2018 and 02/15/2017. FINDINGS: Lung volumes are lower than on the comparison exams with mild basilar atelectasis. Heart size is upper normal. No pneumothorax or  pleural fluid. No acute or focal bony abnormality. IMPRESSION: No acute disease. Electronically Signed   By: Drusilla Kanner M.D.   On: 11/26/2018 14:46   Ct Coronary Morph W/cta Cor W/score W/ca W/cm &/or Wo/cm  Addendum Date: 11/27/2018   ADDENDUM REPORT: 11/27/2018 14:36 CLINICAL DATA:  Chest pain EXAM: Cardiac CTA MEDICATIONS: Sub lingual nitro. 4mg  x 2 TECHNIQUE: The patient was scanned on a Siemens 192 slice scanner. Gantry rotation speed was 250 msecs. Collimation was 0.6 mm. A 100 kV prospective scan was triggered in the ascending thoracic aorta at 35-75% of the R-R interval. Average HR during the scan was 60 bpm. The 3D data set was interpreted on a dedicated work station using MPR, MIP and VRT modes. A total of 80cc of contrast was used. FINDINGS: Non-cardiac: See separate report from [facility]. Pulmonary veins drained  normally to the left atrium. Calcium Score: 157 Agatston units. Coronary Arteries: Right dominant with no anomalies LM: No plaque or stenosis. LAD system: Mixed plaque in the proximal LAD, no significant stenosis. Circumflex system: Mixed plaque in the proximal LCx, suspect mild stenosis (<50%). RCA system: Mixed plaque with mild stenosis (<50%) in the proximal RCA. Mixed plaque with mild stenosis in the mid RCA. IMPRESSION: 1. Coronary artery calcium score 157 Agatston units. This places the patient in the 83rd percentile, suggesting high risk for future cardiac events. 2. Mild stenosis in the proximal LCx and the proximal and mid RCA. Will confirm with FFR. Dalton Mclean Electronically Signed   By: Marca Ancona M.D.   On: 11/27/2018 14:36   Result Date: 11/27/2018 EXAM: OVER-READ INTERPRETATION  CT CHEST The following report is an over-read performed by radiologist Dr. Genevive Bi of [facility] on 11/27/2018. This over-read does not include interpretation of cardiac or coronary anatomy or pathology. The coronary CTA interpretation by the cardiologist is attached. COMPARISON:  None. FINDINGS: Limited view of the lung parenchyma demonstrates 5 mm noncalcified nodule within the superior aspect of the LEFT lower lobe (image 7/12). Airways are normal. Limited view of the mediastinum demonstrates no adenopathy. Esophagus normal. Limited view of the upper abdomen unremarkable. Limited view of the skeleton and chest wall is unremarkable. Degenerative osteophytosis of the spine. IMPRESSION: 1. LEFT lobe pulmonary nodule. No follow-up needed if patient is low-risk. Non-contrast chest CT can be considered in 12 months if patient is high-risk. This recommendation follows the consensus statement: Guidelines for Management of Incidental Pulmonary Nodules Detected on CT Images: From the Fleischner Society 2017; Radiology 2017; 284:228-243. 2.  Degenerative osteophytosis of the spine. These results will be  called to the ordering clinician or representative by the Radiologist Assistant, and communication documented in the PACS or zVision Dashboard. Electronically Signed: By: Genevive Bi M.D. On: 11/27/2018 14:15    Cardiac Studies   Echocardiogram 11/27/18: Study Conclusions  - Left ventricle: The cavity size was normal. There was mild   concentric hypertrophy. Systolic function was mildly to   moderately reduced. The estimated ejection fraction was in the   range of 40% to 45%. Mild diffuse hypokinesis with no   identifiable regional variations. Doppler parameters are   consistent with abnormal left ventricular relaxation (grade 1   diastolic dysfunction). Indeterminate mean left atrial filling   pressure. - Left atrium: The atrium was mildly dilated. - Pulmonary arteries: Systolic pressure was mildly increased. PA   peak pressure: 33 mm Hg (S).  Patient Profile     57 y.o. male with a hx of DM,  HTN, intermittent noncompliance, former tobacco abuse and smoking who is being followed by cardiology for the evaluation of CP  Assessment & Plan    1. Chest pain: patient presented with intermittent non-exertional chest pain. Trop negative x3. EKG with TWI in lateral leads. Patient had a coronary CTA with elevated calcium score of 157 placing patient in the 83rd percentile suggesting high future risk of cardiac events. He was noted to have mild stenosis in the pLCx and prox-mid RCA which was not hemodynamically significant. He is recommended for aggressive risk factor modifications.  - Continue aspirin and statin - Continue BP management below  2. Chronic combined CHF: echo this admission with EF 40-45% - likely 2/2 poorly controlled BP. Coronary CTA without significant blockages.  - Transition metoprolol to succinate 50mg  daily - Continue lisinopril  3. HTN: BP poorly controlled. Started on amlodipine 5mg  daily. - Continue amlodipine, lisinopril, and HCTZ - Will transition to  metoprolol succinate given mildly decreased LV function  4. HLD: LDL 106. Atorvastatin increased to 80mg  daily - Continue statin  5. DM type 2: A1C 6.4 this admission. Would benefit from better glycemic control - Continue management per primary team  6. Suspected OSA: fiance reports snoring - Message sent to schedulers to arrange an outpatient sleep study   [facility] will sign off.   Medication Recommendations:  Amlodipine 5mg  daily, lisinopril 20mg  BID, HCTZ 25mg  daily, and metoprolol succinate 50mg  daily Other recommendations (labs, testing, etc):  Outpatient sleep study Follow up as an outpatient:  Follow-up arranged with Ronie Spies, PA-C 12/11/18 and AVS updated.      For questions or updates, please contact [facility] Please consult [email or web address] for contact info under Cardiology/STEMI.      Signed, Beatriz Stallion, PA-C  11/28/2018, 2:21 PM   [phone]

## 2018-11-28 NOTE — Plan of Care (Signed)

## 2018-11-28 NOTE — Discharge Summary (Addendum)
Physician Discharge Summary  Thomas Yoder VQX:[id number] DOB: 31-Jul-1961 DOA: 11/26/2018  PCP: [name], MD  Admit date: 11/26/2018 Discharge date: 11/28/2018  Time spent: 45 minutes  Recommendations for Outpatient Follow-up:  1. Follow up with cardiology as scheduled 2. Follow up with PCP 1-2 weeks for evaluation of BP control. Likely benefit sleep study as well   Discharge Diagnoses:  Principal Problem:   Chest pain Active Problems:   Essential hypertension, benign   Snoring   DM (diabetes mellitus), type 2 (HCC)   Obesity (BMI 30-39.9)   Discharge Condition: stable  Diet recommendation: heart healthy carb modified  Filed Weights   11/26/18 2146 11/27/18 0428 11/28/18 0418  Weight: 111.4 kg 109.8 kg 108.6 kg    History of present illness:  Thomas Yoder is an 57 y.o. male with medical history significant of HTN, DM2, depression, HLD abnormal stress testAdmitted for Chest pain evaluation on 11/27/18.  CTA read as high risk coronary events.  Echo done with decreased EF.   Hospital Course:  Chest pain -echo: Left ventricle: The cavity size was normal. There was mild concentric hypertrophy. Systolic function was mildly to moderately reduced. The estimated ejection fraction was in the range of 40% to 45%. Mild diffuse hypokinesis with no identifiable regional variations. Doppler parameters are consistent with abnormal left ventricular relaxation (grade 1 diastolic dysfunction). Indeterminate mean left atrial filling pressure. -LDL 106 -CTA: high risk for future cardiac events. FFR unremarkable per card PA. -evaluated by cardiology who recommend aggressive risk factor modification with tight BP control. Lopressor increased, continue HCTZ, BB and ACE I. -OP follow up with cardiology scheduled.   Incidental lung nodule -will need outpatient follow up  Essential hypertension -see above  Suspected OSA -will need outpatient sleep  study  Obesity. BMI 37.8.  -nutritional consult -weight loss discussed  Tobacco abuse -quit 2 weeks ago  Dm 2  -SSI  -hgbA1c: 6.4   Procedures:  Echo   CTA  Consultations:  traci turner cards  Discharge Exam: Vitals:   11/28/18 1101 11/28/18 1217  BP: (!) 154/93 (!) 141/81  Pulse: 64 65  Resp: 20 18  Temp:  98.3 F (36.8 C)  SpO2: 98% 95%    General: obese lying in bed snoring. Easily aroused no acute distress Cardiovascular: rrr no mgr no LE edema Respiratory: normal effort Respers somewhat shallow BS distant but clear  Discharge Instructions   Discharge Instructions    Call MD for:  difficulty breathing, headache or visual disturbances   Complete by:  As directed    Call MD for:  persistant dizziness or light-headedness   Complete by:  As directed    Call MD for:  persistant nausea and vomiting   Complete by:  As directed    Diet - low sodium heart healthy   Complete by:  As directed    Discharge instructions   Complete by:  As directed    Follow up with cardiology as scheudled   Increase activity slowly   Complete by:  As directed      Allergies as of 11/28/2018   No Known Allergies     Medication List    TAKE these medications   Adult Blood Pressure Cuff Lg Kit Check blood pressure daily and record readings.   Blood Pressure Kit Devi 1 Device by Does not apply route daily.   amLODipine 5 MG tablet Commonly known as:  NORVASC Take 1 tablet (5 mg total) by mouth daily. What changed:  medication strength  how much to take   aspirin 81 MG EC tablet Take 1 tablet (81 mg total) by mouth daily. Start taking on:  11/29/2018 What changed:    medication strength  how much to take  when to take this   atorvastatin 80 MG tablet Commonly known as:  LIPITOR Take 1 tablet (80 mg total) by mouth daily. What changed:    medication strength  how much to take   dicyclomine 20 MG tablet Commonly known as:  BENTYL Take 1  tablet (20 mg total) by mouth 3 (three) times daily as needed for spasms.   hydrochlorothiazide 12.5 MG tablet Commonly known as:  HYDRODIURIL Take 12.5 mg by mouth daily. What changed:  Another medication with the same name was removed. Continue taking this medication, and follow the directions you see here.   lisinopril 40 MG tablet Commonly known as:  PRINIVIL,ZESTRIL Take 1 tablet (40 mg total) by mouth daily. What changed:  Another medication with the same name was removed. Continue taking this medication, and follow the directions you see here.   metFORMIN 500 MG tablet Commonly known as:  GLUCOPHAGE Take 1 tablet (500 mg total) by mouth daily with breakfast.   metoprolol succinate 50 MG 24 hr tablet Commonly known as:  TOPROL-XL Take 1 tablet (50 mg total) by mouth daily. Take with or immediately following a meal. Start taking on:  11/29/2018   naproxen 500 MG tablet Commonly known as:  NAPROSYN Take 1 tablet (500 mg total) by mouth 2 (two) times daily with a meal. As needed for pain What changed:    when to take this  reasons to take this  additional instructions   omeprazole 20 MG capsule Commonly known as:  PRILOSEC Take 1 capsule (20 mg total) by mouth daily.      No Known Allergies Follow-up Information    Charlie Pitter, PA-C Follow up on 12/11/2018.   Specialties:  Cardiology, Radiology Why:  Please arrive 15 minutes early for your 9:00am post-hospital cardiology appointment Contact information: [address] [place] [address] [phone]            The results of significant diagnostics from this hospitalization (including imaging, microbiology, ancillary and laboratory) are listed below for reference.    Significant Diagnostic Studies: Dg Chest 2 View  Result Date: 11/26/2018 CLINICAL DATA:  Left chest and arm pain today.  Abnormal EKG. EXAM: CHEST - 2 VIEW COMPARISON:  PA and lateral chest 03/14/2018 and 02/15/2017.  FINDINGS: Lung volumes are lower than on the comparison exams with mild basilar atelectasis. Heart size is upper normal. No pneumothorax or pleural fluid. No acute or focal bony abnormality. IMPRESSION: No acute disease. Electronically Signed   By: Inge Rise M.D.   On: 11/26/2018 14:46   Ct Coronary Morph W/cta Cor W/score W/ca W/cm &/or Wo/cm  Addendum Date: 11/28/2018   ADDENDUM REPORT: 11/28/2018 15:15 ADDENDUM: Coronary CTA was sent for FFR. Findings: FFR 0.82 distal RCA FFR 0.85 mid LCx No evidence for hemodynamically significant stenosis. Electronically Signed   By: Loralie Champagne M.D.   On: 11/28/2018 15:15   Addendum Date: 11/27/2018   ADDENDUM REPORT: 11/27/2018 14:36 CLINICAL DATA:  Chest pain EXAM: Cardiac CTA MEDICATIONS: Sub lingual nitro. 24m x 2 TECHNIQUE: The patient was scanned on a Siemens 1326slice scanner. Gantry rotation speed was 250 msecs. Collimation was 0.6 mm. A 100 kV prospective scan was triggered in the ascending thoracic aorta at 35-75%  of the R-R interval. Average HR during the scan was 60 bpm. The 3D data set was interpreted on a dedicated work station using MPR, MIP and VRT modes. A total of 80cc of contrast was used. FINDINGS: Non-cardiac: See separate report from [facility]. Pulmonary veins drained normally to the left atrium. Calcium Score: 157 Agatston units. Coronary Arteries: Right dominant with no anomalies LM: No plaque or stenosis. LAD system: Mixed plaque in the proximal LAD, no significant stenosis. Circumflex system: Mixed plaque in the proximal LCx, suspect mild stenosis (<50%). RCA system: Mixed plaque with mild stenosis (<50%) in the proximal RCA. Mixed plaque with mild stenosis in the mid RCA. IMPRESSION: 1. Coronary artery calcium score 157 Agatston units. This places the patient in the 83rd percentile, suggesting high risk for future cardiac events. 2. Mild stenosis in the proximal LCx and the proximal and mid RCA. Will confirm with FFR.  Dalton Mclean Electronically Signed   By: Loralie Champagne M.D.   On: 11/27/2018 14:36   Result Date: 11/28/2018 EXAM: OVER-READ INTERPRETATION  CT CHEST The following report is an over-read performed by radiologist Dr. Suzy Bouchard of [facility], [place] on 11/27/2018. This over-read does not include interpretation of cardiac or coronary anatomy or pathology. The coronary CTA interpretation by the cardiologist is attached. COMPARISON:  None. FINDINGS: Limited view of the lung parenchyma demonstrates 5 mm noncalcified nodule within the superior aspect of the LEFT lower lobe (image 7/12). Airways are normal. Limited view of the mediastinum demonstrates no adenopathy. Esophagus normal. Limited view of the upper abdomen unremarkable. Limited view of the skeleton and chest wall is unremarkable. Degenerative osteophytosis of the spine. IMPRESSION: 1. LEFT lobe pulmonary nodule. No follow-up needed if patient is low-risk. Non-contrast chest CT can be considered in 12 months if patient is high-risk. This recommendation follows the consensus statement: Guidelines for Management of Incidental Pulmonary Nodules Detected on CT Images: From the Fleischner Society 2017; Radiology 2017; 284:228-243. 2.  Degenerative osteophytosis of the spine. These results will be called to the ordering clinician or representative by the Radiologist Assistant, and communication documented in the PACS or zVision Dashboard. Electronically Signed: By: Suzy Bouchard M.D. On: 11/27/2018 14:15    Microbiology: No results found for this or any previous visit (from the past 240 hour(s)).   Labs: Basic Metabolic Panel: Recent Labs  Lab 11/26/18 1423 11/28/18 0533  NA 138 138  K 4.3 3.8  CL 103 102  CO2 25 26  GLUCOSE 90 103*  BUN 19 13  CREATININE 1.20 1.05  CALCIUM 9.4 8.8*   Liver Function Tests: No results for input(s): AST, ALT, ALKPHOS, BILITOT, PROT, ALBUMIN in the last 168 hours. No results for input(s): LIPASE,  AMYLASE in the last 168 hours. No results for input(s): AMMONIA in the last 168 hours. CBC: Recent Labs  Lab 11/26/18 1423 11/28/18 0533  WBC 5.5 5.5  HGB 14.1 13.9  HCT 47.0 45.6  MCV 81.5 79.9*  PLT 222 201   Cardiac Enzymes: Recent Labs  Lab 11/26/18 2230 11/27/18 0028 11/27/18 0634 11/28/18 1008  TROPONINI <0.03 <0.03 <0.03 <0.03   BNP: BNP (last 3 results) No results for input(s): BNP in the last 8760 hours.  ProBNP (last 3 results) No results for input(s): PROBNP in the last 8760 hours.  CBG: Recent Labs  Lab 11/27/18 1259 11/27/18 1655 11/27/18 1929 11/28/18 0727 11/28/18 1150  GLUCAP 132* 117* 128* 106* 138*       Signed:  Radene Gunning NP  [facility] 11/28/2018, 3:59 PM

## 2018-11-28 NOTE — Plan of Care (Signed)
  Problem: Nutrition: Goal: Adequate nutrition will be maintained Outcome: Progressing   Problem: Elimination: Goal: Will not experience complications related to bowel motility Outcome: Progressing   Problem: Pain Managment: Goal: General experience of comfort will improve Outcome: Progressing   

## 2018-11-28 NOTE — Telephone Encounter (Signed)
New message   TOC appt made for 12.24.19 at 9:00am with Dayna Dunn per Dot Lanes.

## 2018-11-29 ENCOUNTER — Telehealth: Payer: Self-pay | Admitting: *Deleted

## 2018-11-29 DIAGNOSIS — E1169 Type 2 diabetes mellitus with other specified complication: Secondary | ICD-10-CM

## 2018-11-29 DIAGNOSIS — I10 Essential (primary) hypertension: Secondary | ICD-10-CM

## 2018-11-29 DIAGNOSIS — R0683 Snoring: Secondary | ICD-10-CM

## 2018-11-29 NOTE — Telephone Encounter (Signed)
Patient contacted regarding discharge from [facility] on 11/28/18.  Patient understands to follow up with provider Ronie Spies on 12/11/18 at 9:00 at [address] Patient understands discharge instructions? yes Patient understands medications and regiment? yes Patient understands to bring all medications to this visit? yes

## 2018-11-29 NOTE — Telephone Encounter (Signed)
Patient is scheduled for lab study on 01/03/19. Patient understands his sleep study will be done at [facility] sleep lab. Patient understands he will receive a sleep packet in a week or so. Patient understands to call if he does not receive the sleep packet in a timely manner.  Left detailed message on voicemail with date and time of titration and informed patient to call back to confirm or reschedule.

## 2018-11-29 NOTE — Telephone Encounter (Signed)
  Gaynelle Cage, CMA  Reesa Chew, CMA        Per Mont Dutton @ [employer] no PA is required. Ok to schedule sleep study.

## 2018-11-29 NOTE — Telephone Encounter (Signed)
-----   Message from Gaynelle Cage, CMA sent at 11/29/2018 12:30 PM EST ----- Regarding: RE: PRE CERT There's not a order in the computer. ----- Message ----- From: Reesa Chew, CMA Sent: 11/28/2018   6:29 PM EST To: [facility] Subject: PRE CERT                                         ----- Message ----- From: Beatriz Stallion., PA-C Sent: 11/28/2018   2:55 PM EST To: Reesa Chew, CMA Subject: Outpatient sleep study                         Hello again! I have another patient who would benefit from an outpatient sleep study. Can you help arrange?  Thanks you!

## 2018-11-29 NOTE — Telephone Encounter (Signed)
-----   Message from Reesa Chew, CMA sent at 11/29/2018  1:14 PM EST ----- Regarding: RE: PRE CERT Order in sorry! ----- Message ----- From: Gaynelle Cage, CMA Sent: 11/29/2018  12:30 PM EST To: Reesa Chew, CMA Subject: RE: PRE CERT                                   There's not a order in the computer. ----- Message ----- From: Reesa Chew, CMA Sent: 11/28/2018   6:29 PM EST To: [facility] Subject: PRE CERT                                         ----- Message ----- From: Beatriz Stallion., PA-C Sent: 11/28/2018   2:55 PM EST To: Reesa Chew, CMA Subject: Outpatient sleep study                         Hello again! I have another patient who would benefit from an outpatient sleep study. Can you help arrange?  Thanks you!

## 2018-11-29 NOTE — Telephone Encounter (Signed)
lpmtcb 12/12 

## 2018-11-29 NOTE — Telephone Encounter (Signed)
Staff message sent to [name] per Denny Peon C @ [employer] No PA is required. Ok to schedule sleep study.

## 2018-12-05 ENCOUNTER — Inpatient Hospital Stay: Payer: BC Managed Care – PPO | Admitting: Family Medicine

## 2018-12-10 ENCOUNTER — Encounter: Payer: Self-pay | Admitting: Physician Assistant

## 2018-12-10 NOTE — Progress Notes (Signed)
Cardiology Office Note    Date:  12/11/2018  ID:  [name], DOB 10-19-1961, MRN [id number] PCP:  [name], MD  Cardiologist:  Fransico Him, MD   Chief Complaint: post-hospital follow-up  History of Present Illness:  Thomas Yoder is a 57 y.o. male with history of DM, HTN, intermittent noncompliance, tobacco abuse, snoring, NICM, nonobstructive CAD by CT 11/2018 who presents for post-hospital follow-up.  To recap, the patient remotely had a stress test ordered by primary care in 2015 with perfusion demonstrating moderate sized mild ischemia in inferior wall extending from base towards apex, + inferior wall HK, EF 49% at Dr. Irven Shelling office. The patient He did not recall having this study and didn't follow up with cardiology thereafter until a blood pressure with Dr. Curt Bears in 2017. No specific further cardiac testing was advised at that time. He recently saw primary care back for HTN in the setting of not taking meds reliably. At their office visit he reports rare episodic chest discomfort and was advised to go to the ER. He ruled out for MI. 2D echo 11/27/18 showed EF 40-45%, grade 1 DD, mildly dilated LA, mildly increased PASP. Cardiac CT showed calcium score of 83rd percentile, mild stenosis in the proximal LCx and the proximal and mid RCA, negative FFR for hemodynamic significance. Medical therapy was advised. Otherwise labs showed K 3.8, Cr 1.05, LDL 106, trig 364, A1C 6.4, LFTs wnl. His blood pressure meds were adjusted and he was also started on atorvastatin. Sleep study is pending.  He returns for follow-up today alone.  He brings in his medication bottles, but is not quite sure what he is taking because his girlfriend sets up all of his medication.  He states he is taking everything that he brought today.  Unfortunately it appears there are duplicates of the hydrochlorothiazide and lisinopril.  There is a 20 mg bottle of lisinopril and a 12.5 mg bottle of HCTZ, the latter of  which somebody has written to increase to 2 tablets daily.  He should be on lisinopril 40 mg and HCTZ 25 mg daily.  His blood pressure remains high.  Otherwise he is feeling great without any chest pain, shortness of breath, orthopnea, PND.  He does report occasional ankle swelling, but this is not currently present.  We discussed his diagnosis of cardiomyopathy in depth and I believe he probably has some degree of chronic combined heart failure based on prior swelling.   Past Medical History:  Diagnosis Date  . CAD (coronary artery disease)    a. Cardiac CT showed calcium score of 83rd percentile, mild stenosis in the proximal LCx and the proximal and mid RCA, negative FFR for hemodynamic significance.  . Chest pain   . Depression   . Diabetes mellitus without complication ([place])   . Former tobacco use   . H/O noncompliance with medical treatment, presenting hazards to health   . Hyperlipidemia   . Hypertension   . NICM (nonischemic cardiomyopathy) ([place])    a. EF 40-45% by echo 11/2018.  . Obesity (BMI 30-39.9)   . Snoring     Past Surgical History:  Procedure Laterality Date  . JOINT REPLACEMENT    . KNEE SURGERY    . 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 amLODipine (NORVASC) 5 MG tablet Take 1  tablet (5 mg total) by mouth daily.  Marland Kitchen aspirin EC 81 MG EC tablet Take 1 tablet (81 mg total) by mouth daily.  Marland Kitchen atorvastatin (LIPITOR) 80 MG tablet Take 1 tablet (80 mg total) by mouth daily.  . Blood Pressure Monitoring (ADULT BLOOD PRESSURE CUFF LG) KIT Check blood pressure daily and record readings.  . Blood Pressure Monitoring (BLOOD PRESSURE KIT) DEVI 1 Device by Does not apply route daily.  Marland Kitchen dicyclomine (BENTYL) 20 MG tablet Take 1 tablet (20 mg total) by mouth 3 (three) times daily as needed for  spasms.  . hydrochlorothiazide (HYDRODIURIL) 12.5 MG tablet Take 12.5 mg by mouth 2 (two) times daily.   . hydrochlorothiazide (HYDRODIURIL) 25 MG tablet Take 25 mg by mouth daily.  Marland Kitchen lisinopril (PRINIVIL,ZESTRIL) 20 MG tablet Take 20 mg by mouth daily.  Marland Kitchen lisinopril (PRINIVIL,ZESTRIL) 40 MG tablet Take 1 tablet (40 mg total) by mouth daily.  . naproxen (NAPROSYN) 500 MG tablet Take 1 tablet (500 mg total) by mouth 2 (two) times daily with a meal. As needed for pain (Patient taking differently: Take 500 mg by mouth 2 (two) times daily as needed (for pain). )  . [DISCONTINUED] metoprolol succinate (TOPROL-XL) 50 MG 24 hr tablet Take 1 tablet (50 mg total) by mouth daily. Take with or immediately following a meal.    Allergies:   Patient has no known allergies.   Social History   Socioeconomic History  . Marital status: Single    Spouse name: Not on file  . Number of children: 3  . Years of education: Not on file  . Highest education level: Not on file  Occupational History  . Not on file  Social Needs  . Financial resource strain: Not on file  . Food insecurity:    Worry: Not on file    Inability: Not on file  . Transportation needs:    Medical: Not on file    Non-medical: Not on file  Tobacco Use  . Smoking status: Former Smoker    Packs/day: 0.50    Years: 34.00    Pack years: 17.00    Types: Cigarettes  . Smokeless tobacco: Never Used  . Tobacco comment: 2-3 weeks  Substance and Sexual Activity  . Alcohol use: No  . Drug use: Yes    Types: Marijuana  . Sexual activity: Yes    Birth control/protection: Condom  Lifestyle  . Physical activity:    Days per week: Not on file    Minutes per session: Not on file  . Stress: Not on file  Relationships  . Social connections:    Talks on phone: Not on file    Gets together: Not on file    Attends religious service: Not on file    Active member of club or organization: Not on file    Attends meetings of clubs or  organizations: Not on file    Relationship status: Not on file  Other Topics Concern  . Not on file  Social History Narrative  . Not on file     Family History:  The patient's family history includes Diabetes in his mother; Hypertension in his father and mother. There is no history of CAD, Stroke, or Cancer.  ROS:   Please see the history of present illness.  All other systems are reviewed and otherwise negative.    PHYSICAL EXAM:   VS:  BP (!) 150/98   Pulse 69   Ht '5\' 7"'  (1.702 m)   Wt  248 lb 12.8 oz (112.9 kg)   SpO2 97%   BMI 38.97 kg/m   BMI: Body mass index is 38.97 kg/m. GEN: Well nourished, well developed AAM in no acute distress HEENT: normocephalic, atraumatic Neck: no JVD, carotid bruits, or masses Cardiac: RRR; no murmurs, rubs, or gallops, no edema  Respiratory:  clear to auscultation bilaterally, normal work of breathing GI: soft, nontender, nondistended, + BS MS: no deformity or atrophy Skin: warm and dry, no rash Neuro:  Alert and Oriented x 3, Strength and sensation are intact, follows commands Psych: euthymic mood, full affect  Wt Readings from Last 3 Encounters:  12/11/18 248 lb 12.8 oz (112.9 kg)  11/28/18 239 lb 6.4 oz (108.6 kg)  11/26/18 245 lb (111.1 kg)      Studies/Labs Reviewed:   EKG:   EKG was not ordered today  Recent Labs: 10/18/2018: TSH 1.350 10/26/2018: ALT 17 11/28/2018: BUN 13; Creatinine, Ser 1.05; Hemoglobin 13.9; Platelets 201; Potassium 3.8; Sodium 138   Lipid Panel    Component Value Date/Time   CHOL 204 (H) 11/27/2018 0028   CHOL 199 10/18/2018 1452   TRIG 364 (H) 11/27/2018 0028   HDL 25 (L) 11/27/2018 0028   HDL 31 (L) 10/18/2018 1452   CHOLHDL 8.2 11/27/2018 0028   VLDL 73 (H) 11/27/2018 0028   LDLCALC 106 (H) 11/27/2018 0028   LDLCALC 105 (H) 10/18/2018 1452    Additional studies/ records that were reviewed today include: Summarized above.   ASSESSMENT & PLAN:   1. CAD -doing well without angina.   Continue aggressive risk factor modification.  He requested that I typed out his most recent diagnoses for his significant other and I have done so on his AVS. 2. NICM/chronic combined CHF - he is NYHA class 1 today. Will continue medication titration. BP remains elevated. I believe we would get more benefit from carvedilol to lower BP than metoprolol. D/c metoprolol and change to carvedilol 12.67m BID.  He remains on amlodipine for now which may be less ideal given his LV dysfunction, but on the other hand, his decreased EF may be related to hypertensive cardiomyopathy so I will continue for now. Consideration can be given going forward to changing this to spironolactone.  I do not want to overwhelm the patient with too many medicine changes as it appears that med reconciliation has proven challenging for him.  While some people can handle using up the older lower dose prescriptions to equal the higher dose in the meantime, I am worried about the multiple bottles confusing him so we set aside the lower dose lisinopril and HCTZ. We put a rubber band around these medicines, as well as the metoprolol that is being stopped, with a large sticky note that says "DO NOT TAKE."  He tried to reach his girlfriend during today's office visit but was unable to make contact.  I recommended he have her attend his next office visits since she manages his medications.  We tried to make his instructions as clear as possible on his paperwork.  We will also check BMET today. Reviewed 2g sodium restriction, 2L fluid restriction, daily weights with patient. 3. Essential HTN - follow with medication changes above. Sleep study pending. Also discussed role of weight los. 4. Hyperlipidemia-continue with statin.  If he is tolerating this and taking at time of follow-up, would recommend arranging follow-up lipid profile and LFTs 2-3 months from initiation.  Disposition: F/u with nurse BP check in ~1 week, and me in 3  weeks.  Medication  Adjustments/Labs and Tests Ordered: Current medicines are reviewed at length with the patient today.  Concerns regarding medicines are outlined above. Medication changes, Labs and Tests ordered today are summarized above and listed in the Patient Instructions accessible in Encounters.   Signed, Charlie Pitter, PA-C  12/11/2018 9:49 AM    [facility] [address], [place], [place]  [address] Phone: [phone]; Fax: [phone]

## 2018-12-11 ENCOUNTER — Ambulatory Visit (INDEPENDENT_AMBULATORY_CARE_PROVIDER_SITE_OTHER): Payer: BC Managed Care – PPO | Admitting: Physician Assistant

## 2018-12-11 ENCOUNTER — Encounter: Payer: Self-pay | Admitting: Physician Assistant

## 2018-12-11 VITALS — BP 150/98 | HR 69 | Ht 67.0 in | Wt 248.8 lb

## 2018-12-11 DIAGNOSIS — I10 Essential (primary) hypertension: Secondary | ICD-10-CM

## 2018-12-11 DIAGNOSIS — E785 Hyperlipidemia, unspecified: Secondary | ICD-10-CM

## 2018-12-11 DIAGNOSIS — I428 Other cardiomyopathies: Secondary | ICD-10-CM | POA: Diagnosis not present

## 2018-12-11 DIAGNOSIS — I2510 Atherosclerotic heart disease of native coronary artery without angina pectoris: Secondary | ICD-10-CM

## 2018-12-11 DIAGNOSIS — I5042 Chronic combined systolic (congestive) and diastolic (congestive) heart failure: Secondary | ICD-10-CM | POA: Insufficient documentation

## 2018-12-11 HISTORY — DX: Chronic combined systolic (congestive) and diastolic (congestive) heart failure: I50.42

## 2018-12-11 LAB — BASIC METABOLIC PANEL
BUN/Creatinine Ratio: 16 (ref 9–20)
BUN: 16 mg/dL (ref 6–24)
CALCIUM: 9.3 mg/dL (ref 8.7–10.2)
CO2: 25 mmol/L (ref 20–29)
Chloride: 101 mmol/L (ref 96–106)
Creatinine, Ser: 0.98 mg/dL (ref 0.76–1.27)
GFR calc Af Amer: 99 mL/min/{1.73_m2} (ref >59)
GFR calc non Af Amer: 86 mL/min/{1.73_m2} (ref >59)
Glucose: 121 mg/dL — ABNORMAL HIGH (ref 65–99)
POTASSIUM: 4.1 mmol/L (ref 3.5–5.2)
Sodium: 139 mmol/L (ref 134–144)

## 2018-12-11 MED ORDER — CARVEDILOL 12.5 MG PO TABS: 12.5000 mg | ORAL_TABLET | Freq: Two times a day (BID) | ORAL | 3 refills | Status: DC

## 2018-12-11 NOTE — Patient Instructions (Addendum)
Medication Instructions:  Your physician has recommended you make the following change in your medication:  1.  STOP the Metoprolol 2.  START Carvedilol 12.5 mg taking 1 tablet twice a day  We were concerned during today's visit that you may be getting your medications confused. To simplify this, we have put a rubber band around the medications we DO NOT want you to take. We put a check mark on the bottles you brought in today that we want you to continue with the new medication added today, Carvedilol  If you need a refill on your cardiac medications before your next appointment, please call your pharmacy.   Lab work: TODAY:  BMET  If you have labs (blood work) drawn today and your tests are completely normal, you will receive your results only by: Marland Kitchen MyChart Message (if you have MyChart) OR . A paper copy in the mail If you have any lab test that is abnormal or we need to change your treatment, we will call you to review the results.  Testing/Procedures: None ordered   Follow-Up: Your physician recommends that you schedule a follow-up appointment in: 12/21/18 ARRIVE AT 1:45 FOR A BLOOD PRESSURE CHECK   Any Other Special Instructions Will Be Listed Below (If Applicable).  Mr. Kochanski, you requested we provide an update of your recent medical history for your significant other. To recap, you remotely stress test ordered by primary care in 2015 with which was abnormal at Dr. Verl Dicker office. When we talked in the hospital, you did not recall having this study. You were recently sent to the hospital for chest pain and bloodwork ruled out a heart attack. Your echo showed your ejection fraction (squeeze function) number was 40-45% with stiffness of the heart called diastolic dysfunction. Normal squeeze function is 55-60%. This can increase risk of congestive heart failure symptoms. Cardiac CT showed calcium score of 83rd percentile indicating increased risk of cardiac events. Fortunately this only  showed mild blockages in the heart arteries called the circumflex and right coronary artery. There did not appear to be any impairment of the blood flow, but you really need to work on risk factor control such as blood pressure, cholesterol, and blood sugar.   For patients with congestive heart failure, we give them these special instructions:  1. Follow a low-salt diet - you are allowed no more than 2,000mg  of sodium per day. Watch your fluid intake. In general, you should not be taking in more than 2 liters of fluid per day (no more than 8 glasses per day). This includes sources of water in foods like soup, coffee, tea, milk, etc. 2. Weigh yourself on the same scale at same time of day and keep a log. 3. Call your doctor: (Anytime you feel any of the following symptoms)  - 3lb weight gain overnight or 5lb within a few days - Shortness of breath, with or without a dry hacking cough  - Swelling in the hands, feet or stomach  - If you have to sleep on extra pillows at night in order to breathe   IT IS IMPORTANT TO LET YOUR DOCTOR KNOW EARLY ON IF YOU ARE HAVING SYMPTOMS SO WE CAN HELP YOU!

## 2018-12-13 ENCOUNTER — Telehealth: Payer: Self-pay | Admitting: *Deleted

## 2018-12-13 NOTE — Telephone Encounter (Signed)
Called pt re: lab results, left a message for him to call back  

## 2018-12-13 NOTE — Telephone Encounter (Signed)
-----   Message from Dayna N Dunn, PA-C sent at 12/11/2018  3:56 PM EST ----- Please let patient know labs stable. Continue plan as discussed. Dayna Dunn PA-C  

## 2018-12-17 NOTE — Telephone Encounter (Signed)
Follow up ° °Patient is returning call for lab results. °

## 2018-12-17 NOTE — Telephone Encounter (Signed)
Pt returned my call and he has been made aware of his stable lab results, and continue current regimen. Pt verbalized understanding.

## 2018-12-17 NOTE — Telephone Encounter (Signed)
2nd attempt to reach pt re: lab results.  Left another message for pt to call back. 

## 2018-12-17 NOTE — Telephone Encounter (Signed)
-----   Message from Laurann Montana, [place] sent at 12/11/2018  3:56 PM EST ----- Please let patient know labs stable. Continue plan as discussed. Dayna Dunn PA-C

## 2018-12-21 ENCOUNTER — Ambulatory Visit (INDEPENDENT_AMBULATORY_CARE_PROVIDER_SITE_OTHER): Payer: BC Managed Care – PPO

## 2018-12-21 VITALS — BP 118/78 | HR 77 | Ht 67.0 in | Wt 242.2 lb

## 2018-12-21 DIAGNOSIS — I10 Essential (primary) hypertension: Secondary | ICD-10-CM

## 2018-12-21 NOTE — Patient Instructions (Signed)
Medication Instructions:   If you need a refill on your cardiac medications before your next appointment, please call your pharmacy.   Lab work:  If you have labs (blood work) drawn today and your tests are completely normal, you will receive your results only by: Marland Kitchen MyChart Message (if you have MyChart) OR . A paper copy in the mail If you have any lab test that is abnormal or we need to change your treatment, we will call you to review the results.  Testing/Procedures: NONE ordered today.  Follow-Up: At [facility], you and your health needs are our priority.  As part of our continuing mission to provide you with exceptional heart care, we have created designated Provider Care Teams.  These Care Teams include your primary Cardiologist (physician) and Advanced Practice Providers (APPs -  Physician Assistants and Nurse Practitioners) who all work together to provide you with the care you need, when you need it. You will need a follow up appointment in 6 months.  Please call our office 2 months in advance to schedule this appointment.  You may see Armanda Magic, MD or one of the following Advanced Practice Providers on your designated Care Team:   [place], PA-C Ronie Spies, PA-C . Jacolyn Reedy, PA-C

## 2018-12-21 NOTE — Progress Notes (Signed)
1.) Reason for visit: BP check  2.) Name of MD requesting visit: Dayna Dunn PA  3.) H&P: Patient has history of HTN, DM, CAD, and NICM.   4.) ROS related to problem: Patient's metoprolol was changed to Carvedilol 12.5 mg by mouth twice daily on 12/11/18 with office visit with Ronie Spies PA. Patient's vitals today BP 118/78, HR 77, O2 97 % on room air. Patient weight 242 lb and height 5\' 7" .   Assessment and plan per MD: Consulted DOD, Dr. Katrinka Blazing, he recommended patient stay on same medications with no changes. Advised patient to continue to check his BP and if his BP starts being high again to give our office a call. Patient will follow up with Dr. Mayford Knife or someone on her team.

## 2018-12-21 NOTE — Progress Notes (Signed)
Perfect! Agree with current regimen. Thanks. Antavius Sperbeck PA-C

## 2018-12-26 ENCOUNTER — Ambulatory Visit: Payer: BC Managed Care – PPO | Admitting: Family Medicine

## 2018-12-26 ENCOUNTER — Other Ambulatory Visit: Payer: Self-pay

## 2018-12-26 ENCOUNTER — Encounter: Payer: Self-pay | Admitting: Family Medicine

## 2018-12-26 VITALS — BP 138/88 | HR 72 | Temp 98.7°F | Resp 17 | Ht 67.0 in | Wt 242.6 lb

## 2018-12-26 DIAGNOSIS — I25810 Atherosclerosis of coronary artery bypass graft(s) without angina pectoris: Secondary | ICD-10-CM | POA: Diagnosis not present

## 2018-12-26 DIAGNOSIS — I5042 Chronic combined systolic (congestive) and diastolic (congestive) heart failure: Secondary | ICD-10-CM

## 2018-12-26 DIAGNOSIS — I428 Other cardiomyopathies: Secondary | ICD-10-CM

## 2018-12-26 DIAGNOSIS — M1712 Unilateral primary osteoarthritis, left knee: Secondary | ICD-10-CM | POA: Diagnosis not present

## 2018-12-26 DIAGNOSIS — Z5181 Encounter for therapeutic drug level monitoring: Secondary | ICD-10-CM

## 2018-12-26 DIAGNOSIS — E118 Type 2 diabetes mellitus with unspecified complications: Secondary | ICD-10-CM

## 2018-12-26 DIAGNOSIS — Z09 Encounter for follow-up examination after completed treatment for conditions other than malignant neoplasm: Secondary | ICD-10-CM

## 2018-12-26 DIAGNOSIS — R0683 Snoring: Secondary | ICD-10-CM

## 2018-12-26 DIAGNOSIS — Z1211 Encounter for screening for malignant neoplasm of colon: Secondary | ICD-10-CM

## 2018-12-26 NOTE — Patient Instructions (Addendum)
If you have lab work done today you will be contacted with your lab results within the next 2 weeks.  If you have not heard from [place] then please contact us. The fastest way to get your results is to register for My Chart.   IF you received an x-ray today, you will receive an invoice from [facility]. Please contact [facility] at [phone] with questions or concerns regarding your invoice.   IF you received labwork today, you will receive an invoice from [place]. Please contact [facility] at [phone] with questions or concerns regarding your invoice.   Our billing staff will not be able to assist you with questions regarding bills from these companies.  You will be contacted with the lab results as soon as they are available. The fastest way to get your results is to activate your My Chart account. Instructions are located on the last page of this paperwork. If you have not heard from [place] regarding the results in 2 weeks, please contact this office.     Cardiomyopathy, Adult     Cardiomyopathy is a long-term (chronic) disease of the heart muscle. The disease makes the heart muscle thick, weak, or stiff. As a result, the heart works harder to pump blood. Over time cardiomyopathy can lead to an irregular heartbeat (arrhythmia) and heart failure. There are several types of cardiomyopathy:  Dilated cardiomyopathy. This type causes the ventricles to become weak and stretched out.  Hypertrophic cardiomyopathy. This type causes the heart muscle to thicken.  Restrictive cardiomyopathy. This type causes the heart muscle to become stiff.  Ischemic cardiomyopathy. This type involves narrowing arteries that cause the walls of the heart to get thinner.  Peripartum cardiomyopathy. This type occurs during pregnancy or shortly after pregnancy. What are the causes? This condition may be caused by:  A gene that is passed down (inherited) from a family member.  A medical  condition that damages the heart, such as: ? Diabetes. ? High blood pressure. ? Viral infection of the heart. ? Heart attack. ? Coronary heart disease.  Alcoholism.  Using illegal drugs or some prescription medicines.  Pregnancy.  Your body absorbing and storing too much iron (hemochromatosis).  Autoimmune diseases, connective tissue diseases, endocrine diseases, and muscle diseases.  Cancer treatments.  Buildup of proteins in your organs (amyloidosis), or inflammation in your organs (sarcoidosis). Often, the cause is not known. What increases the risk? This condition is more likely to develop in people who:  Have a family history of cardiomyopathy or other heart problems.  Are overweight or obese.  Use illegal drugs.  Abuse alcohol.  Have a medical condition that damages the heart. What are the signs or symptoms? Symptoms of this condition include:  Shortness of breath, especially during activity.  Fatigue.  An irregular heartbeat and heart murmurs.  Dizziness.  Light-headedness.  Fainting.  Chest pain.  Coughing.  Swelling in the lower legs, ankles, feet, abdomen, and neck veins. Often, people with this condition have no symptoms. How is this diagnosed? This condition is diagnosed based on:  Your symptoms and medical history.  A physical exam.  Tests. Tests may include:  Blood tests.  Imaging studies of your heart, such as: ? X-rays. ? An echocardiogram. ? An MRI.  An electrocardiogram (ECG). This records your heart's electrical activity.  A test in which you wear a portable device (event monitor) to record your heart's electrical activity while you go about your day.  A stress test. This monitors  your heart's activity while exercising.  Cardiac catheterization. This procedure checks the blood pressure and blood flow in your heart.  An angiogram. This is an injection of dye into your arteries before imaging studies are taken.  Heart  tissue biopsy. This removes a sample of heart tissue for examination. How is this treated? Treatment for this condition depends on the type of cardiomyopathy you have and the severity of your symptoms. If you do not have symptoms, you may not need treatment. If you need treatment, it may include:  Lifestyle changes, such as: ? Eating a heart-healthy diet that includes plenty of fruits, vegetables, and whole grains, and cutting down on salt (sodium). ? Maintaining a healthy weight, and losing weight, if needed. ? Getting regular exercise. ? Quitting smoking, if you smoke. ? Avoiding alcohol. ? Medicine to:  Lower your blood pressure.  Slow down your heart rate.  Keep your heart beating in a steady rhythm.  Clear excess fluids from your body.  Prevent blood clots.  Balance minerals (electrolytes) in your body and get rid of extra sodium in your body.  Reduce inflammation.  Strengthen your heartbeat. ? Surgery to:  Repair a defect.  Remove thickened tissue.  Destroy tissues in the area of abnormal electrical activity (ablation).  Implant a device to treat serious heart rhythm problems (implantable cardioverter-defibrillator, or ICD), or a pacemaker.  Replace your heart (heart transplant) if all other treatments have failed (end stage). Other treatments may include cardiac resynchronization therapy (CRT) or a left ventricular assist device (LVAD). Follow these instructions at home: Lifestyle   Eat a heart-healthy diet. Work with your health care provider or a registered dietitian to learn about healthy eating options.  Maintain a healthy weight.  Stay physically active. Ask your health care provider to suggest some activities that are good for you.  Do not use any products that contain nicotine or tobacco, such as cigarettes and e-cigarettes. If you need help quitting, ask your health care provider.  Limit alcohol intake to no more than one drink per day for nonpregnant  women and no more than two drinks per day for men. One drink equals 12 oz of beer, 5 oz of wine, or 1 oz of hard liquor.  Try to get at least 7 hours of sleep each night.  Find healthy ways to manage stress. General instructions  Take over-the-counter and prescription medicines only as told by your health care provider. Some medicines can be dangerous for your heart.  Tell all health care providers, including your dentist, that you have cardiomyopathy. When you visit the dentist or have surgery, ask your health care provider if you need antibiotics before having dental care or before the surgery.  Ask your health care provider if you should wear a medical identification bracelet. This may be important if you have a pacemaker or a defibrillator.  Make sure you get all recommended vaccinations and an annual flu shot.  Work closely with your health care provider to manage any long-lasting (chronic) conditions.  Keep all follow-up visits as told by your health care provider. This is important, even if you do not have any symptoms. Your health care provider may need to make sure your condition is not getting worse. How is this prevented? This condition cannot be prevented. Parents, siblings, and children of people with this condition may be at risk for the condition. It is a good idea for them to get screened for the condition because it is best when cardiomyopathy  is found early. Screening is done with an ECG and echocardiogram. People who want to start a family may also want to meet with a genetic counselor to discuss the risk of having a child with cardiomyopathy. Contact a health care provider if:  Your symptoms get worse.  You have new symptoms. Get help right away if:  You have severe chest pain.  You have shortness of breath.  You cough up pink, bubbly material.  You have sudden sweating.  You feel nauseous and you vomit.  You suddenly become light-headed or dizzy.  You  feel your heart beating very quickly.  It feels like your heart is skipping beats. These symptoms may represent a serious problem that is an emergency. Do not wait to see if the symptoms will go away. Get medical help right away. Call your local emergency services (911 in the [place] Do not drive yourself to the hospital. Summary  Cardiomyopathy is a long-term (chronic) disease of the heart muscle.  Over time cardiomyopathy can lead to an irregular heartbeat (arrhythmia) and heart failure. This information is not intended to replace advice given to you by your health care provider. Make sure you discuss any questions you have with your health care provider. Document Released: 02/17/2005 Document Revised: 02/24/2017 Document Reviewed: 06/06/2016 Elsevier Interactive Patient Education  2019 [employer].

## 2018-12-26 NOTE — Progress Notes (Signed)
Established Patient Office Visit  Subjective:  Patient ID: Thomas Yoder, male    DOB: 11/16/1961  Age: 58 y.o. MRN: [id number]  CC:  Chief Complaint  Patient presents with  . Hospitalization Follow-up    blood pressure, pt declined flu vaccine while in hospital    HPI [name] presents for   Patient with NICM with chronic heart failure with systolic and diastolic dysfunction  He denies any chest pains and has not smoked since beginning of December 2019 He reports that he chews gum to help his cravings and was able to quit cold [place] He has lost 2 pounds and is exercising. He was put on salt restrictions of 2g sodium per day and 2L water restriction. He reports that he is a picky eater who does not like to eat vegetables.  He states that he started eating vegetables last week.  Lab Results  Component Value Date   CHOL 204 (H) 11/27/2018   CHOL 199 10/18/2018   CHOL 210 (H) 08/21/2012   Lab Results  Component Value Date   HDL 25 (L) 11/27/2018   HDL 31 (L) 10/18/2018   HDL 31 (L) 08/21/2012   Lab Results  Component Value Date   LDLCALC 106 (H) 11/27/2018   LDLCALC 105 (H) 10/18/2018   [place]  08/21/2012     Comment:       Not calculated due to Triglyceride >400. Suggest ordering Direct LDL (Unit Code: [phone]).   Total Cholesterol/HDL Ratio:CHD Risk                        Coronary Heart Disease Risk Table                                        Men       Women          1/2 Average Risk              3.4        3.3              Average Risk              5.0        4.4           2X Average Risk              9.6        7.1           3X Average Risk             23.4       11.0 Use the calculated Patient Ratio above and the CHD Risk table  to determine the patient's CHD Risk. ATP III Classification (LDL):       < 100        mg/dL         Optimal      100 - 129     mg/dL         Near or Above Optimal      130 - 159     mg/dL         Borderline High      160 - 189      mg/dL         High       > 190  mg/dL         Very High     Lab Results  Component Value Date   TRIG 364 (H) 11/27/2018   TRIG 317 (H) 10/18/2018   TRIG 531 (H) 08/21/2012   Lab Results  Component Value Date   CHOLHDL 8.2 11/27/2018   CHOLHDL 6.4 (H) 10/18/2018   CHOLHDL 6.8 08/21/2012   No results found for: LDLDIRECT   Diabetes Diabetes Mellitus: Patient presents for follow up of diabetes. Symptoms: none. Symptoms have stabilized. Patient denies foot ulcerations, hyperglycemia, hypoglycemia , increase appetite, nausea, paresthesia of the feet, polydipsia, polyuria, visual disturbances and vomitting.  Evaluation to date has been included: hemoglobin A1C.  Home sugars: patient does not check sugars. Treatment to date: Continued metformin which has been effective.  Lab Results  Component Value Date   HGBA1C 6.4 (H) 11/26/2018     Past Medical History:  Diagnosis Date  . CAD (coronary artery disease)    a. Cardiac CT showed calcium score of 83rd percentile, mild stenosis in the proximal LCx and the proximal and mid RCA, negative FFR for hemodynamic significance.  . Chest pain   . Chronic combined systolic and diastolic CHF (congestive heart failure) ([place]) 12/11/2018  . Depression   . Diabetes mellitus without complication ([place])   . Former tobacco use   . H/O noncompliance with medical treatment, presenting hazards to health   . Hyperlipidemia   . Hypertension   . NICM (nonischemic cardiomyopathy) ([place])    a. EF 40-45% by echo 11/2018.  . Obesity (BMI 30-39.9)   . Snoring     Past Surgical History:  Procedure Laterality Date  . JOINT REPLACEMENT    . KNEE SURGERY    . TOTAL KNEE ARTHROPLASTY Left 11/01/2016   Procedure: TOTAL KNEE ARTHROPLASTY;  Surgeon: Melrose Nakayama, MD;  Location: [facility];  Service: Orthopedics;  Laterality: Left;    Family History  Problem Relation Age of Onset  . Hypertension Mother   . Diabetes Mother   . Hypertension Father   .  CAD Neg Hx   . Stroke Neg Hx   . Cancer Neg Hx     Social History   Socioeconomic History  . Marital status: Single    Spouse name: Not on file  . Number of children: 3  . Years of education: Not on file  . Highest education level: Not on file  Occupational History  . Not on file  Social Needs  . Financial resource strain: Not on file  . Food insecurity:    Worry: Not on file    Inability: Not on file  . Transportation needs:    Medical: Not on file    Non-medical: Not on file  Tobacco Use  . Smoking status: Former Smoker    Packs/day: 0.50    Years: 34.00    Pack years: 17.00    Types: Cigarettes  . Smokeless tobacco: Never Used  . Tobacco comment: 2-3 weeks  Substance and Sexual Activity  . Alcohol use: No  . Drug use: Yes    Types: Marijuana  . Sexual activity: Yes    Birth control/protection: Condom  Lifestyle  . Physical activity:    Days per week: Not on file    Minutes per session: Not on file  . Stress: Not on file  Relationships  . Social connections:    Talks on phone: Not on file    Gets together: Not on file    Attends religious service: Not on  file    Active member of club or organization: Not on file    Attends meetings of clubs or organizations: Not on file    Relationship status: Not on file  . Intimate partner violence:    Fear of current or ex partner: Not on file    Emotionally abused: Not on file    Physically abused: Not on file    Forced sexual activity: Not on file  Other Topics Concern  . Not on file  Social History Narrative  . Not on file    Outpatient Medications Prior to Visit  Medication Sig Dispense Refill  . amLODipine (NORVASC) 5 MG tablet Take 1 tablet (5 mg total) by mouth daily. 30 tablet 1  . aspirin EC 81 MG EC tablet Take 1 tablet (81 mg total) by mouth daily.    Marland Kitchen atorvastatin (LIPITOR) 80 MG tablet Take 1 tablet (80 mg total) by mouth daily. 30 tablet 11  . Blood Pressure Monitoring (ADULT BLOOD PRESSURE CUFF  LG) KIT Check blood pressure daily and record readings. 1 each 0  . Blood Pressure Monitoring (BLOOD PRESSURE KIT) DEVI 1 Device by Does not apply route daily. 1 Device 0  . carvedilol (COREG) 12.5 MG tablet Take 1 tablet (12.5 mg total) by mouth 2 (two) times daily. 60 tablet 3  . dicyclomine (BENTYL) 20 MG tablet Take 1 tablet (20 mg total) by mouth 3 (three) times daily as needed for spasms. 21 tablet 0  . hydrochlorothiazide (HYDRODIURIL) 25 MG tablet Take 25 mg by mouth daily.    Marland Kitchen lisinopril (PRINIVIL,ZESTRIL) 40 MG tablet Take 1 tablet (40 mg total) by mouth daily. 90 tablet 1  . metFORMIN (GLUCOPHAGE) 500 MG tablet Take 1 tablet (500 mg total) by mouth daily with breakfast. 90 tablet 0  . naproxen (NAPROSYN) 500 MG tablet Take 1 tablet (500 mg total) by mouth 2 (two) times daily with a meal. As needed for pain (Patient taking differently: Take 500 mg by mouth 2 (two) times daily as needed (for pain). ) 14 tablet 0   No facility-administered medications prior to visit.     No Known Allergies  ROS Review of Systems Review of Systems  Constitutional: Negative for activity change, appetite change, chills and fever.  HENT: +congestion and loud snoring, negative nosebleeds, trouble swallowing and voice change.   Respiratory: Negative for cough, shortness of breath and wheezing.   Gastrointestinal: Negative for diarrhea, nausea and vomiting.  Genitourinary: Negative for difficulty urinating, dysuria, flank pain and hematuria.  Musculoskeletal: Negative for back pain, joint swelling and neck pain.  Neurological: Negative for dizziness, speech difficulty, light-headedness and numbness.  See HPI. All other review of systems negative.     Objective:    Physical Exam  BP (!) 144/90 (BP Location: Left Arm, Patient Position: Sitting, Cuff Size: Large)   Pulse 72   Temp 98.7 F (37.1 C) (Oral)   Resp 17   Ht '5\' 7"'  (1.702 m)   Wt 242 lb 9.6 oz (110 kg)   SpO2 98%   BMI 38.00 kg/m     Wt Readings from Last 3 Encounters:  12/26/18 242 lb 9.6 oz (110 kg)  12/21/18 242 lb 3.2 oz (109.9 kg)  12/11/18 248 lb 12.8 oz (112.9 kg)   General: alert, oriented, in NAD Head: normocephalic, atraumatic, no sinus tenderness Eyes: EOM intact, no scleral icterus or conjunctival injection Ears: TM clear bilaterally Nose: mucosa nonerythematous, nonedematous Throat: no pharyngeal exudate or erythema Neck: large neck  circumference, supple Lymph: no posterior auricular, submental or cervical lymph adenopathy Heart: normal rate, normal sinus rhythm, no murmurs Lungs: clear to auscultation bilaterally, no wheezing    Health Maintenance Due  Topic Date Due  . PNEUMOCOCCAL POLYSACCHARIDE VACCINE AGE 79-64 HIGH RISK  05/08/1964  . OPHTHALMOLOGY EXAM  05/08/1972  . COLONOSCOPY  05/08/2012    There are no preventive care reminders to display for this patient.  Lab Results  Component Value Date   TSH 1.350 10/18/2018   Lab Results  Component Value Date   WBC 5.5 11/28/2018   HGB 13.9 11/28/2018   HCT 45.6 11/28/2018   MCV 79.9 (L) 11/28/2018   PLT 201 11/28/2018   Lab Results  Component Value Date   NA 139 12/11/2018   K 4.1 12/11/2018   CO2 25 12/11/2018   GLUCOSE 121 (H) 12/11/2018   BUN 16 12/11/2018   CREATININE 0.98 12/11/2018   BILITOT 0.6 10/26/2018   ALKPHOS 98 10/26/2018   AST 19 10/26/2018   ALT 17 10/26/2018   PROT 7.1 10/26/2018   ALBUMIN 3.8 10/26/2018   CALCIUM 9.3 12/11/2018   ANIONGAP 10 11/28/2018   Lab Results  Component Value Date   CHOL 204 (H) 11/27/2018   Lab Results  Component Value Date   HDL 25 (L) 11/27/2018   Lab Results  Component Value Date   LDLCALC 106 (H) 11/27/2018   Lab Results  Component Value Date   TRIG 364 (H) 11/27/2018   Lab Results  Component Value Date   CHOLHDL 8.2 11/27/2018   Lab Results  Component Value Date   HGBA1C 6.4 (H) 11/26/2018      Assessment & Plan:   Problem List Items Addressed This  Visit   Hospital discharge follow up - reviewed studies and labs from hospitalization with patient and provided education as well as created plan on how to prevent readmission     Cardiovascular and Mediastinum   Chronic combined systolic and diastolic CHF (congestive heart failure) ([place]) -  Reviewed standard of care for CHF including dietary modifications Placed referral to nutrition for patient education given his diabetes and heart disease     Musculoskeletal and Integument   Knee osteoarthritis - Primary  - advised to avoid NSAIDs He should follow up with Orthopedics     Other   Snoring-  Pt already scheduled for sleep study    Other Visit Diagnoses    NICM (nonischemic cardiomyopathy) ([place])       Coronary artery disease involving coronary bypass graft of native heart without angina pectoris       Type 2 diabetes mellitus with complication, without long-term current use of insulin ([place])     -  Complicated by CAD, CHF and NICM as well as likely OSA -  CONTINUE CURRENT MEDS -  Follow up with Nutrition for education      No orders of the defined types were placed in this encounter.   Follow-up: No follow-ups on file.    [name], MD

## 2019-01-01 ENCOUNTER — Encounter: Payer: Self-pay | Admitting: Family Medicine

## 2019-01-03 ENCOUNTER — Ambulatory Visit (HOSPITAL_BASED_OUTPATIENT_CLINIC_OR_DEPARTMENT_OTHER): Payer: BC Managed Care – PPO | Attending: Cardiology | Admitting: Cardiology

## 2019-01-03 DIAGNOSIS — E1169 Type 2 diabetes mellitus with other specified complication: Secondary | ICD-10-CM

## 2019-01-03 DIAGNOSIS — G4733 Obstructive sleep apnea (adult) (pediatric): Secondary | ICD-10-CM | POA: Insufficient documentation

## 2019-01-03 DIAGNOSIS — R0683 Snoring: Secondary | ICD-10-CM

## 2019-01-03 DIAGNOSIS — R0902 Hypoxemia: Secondary | ICD-10-CM | POA: Diagnosis not present

## 2019-01-03 DIAGNOSIS — I10 Essential (primary) hypertension: Secondary | ICD-10-CM

## 2019-01-08 NOTE — Procedures (Signed)
Patient Name: Thomas Yoder, [name] Date: 01/03/2019 Gender: Male D.O.B: 11/26/1961 Age (years): 31 Referring Provider: Will Camnitz Height (inches): 62 Interpreting Physician: Fransico Him MD, ABSM Weight (lbs): 240 RPSGT: Baxter Flattery BMI: 38 MRN: [id number] Neck Size: 18.00  CLINICAL INFORMATION Sleep Study Type: Split Night CPAP  Indication for sleep study: Fatigue, Hypertension, Obesity, Snoring, Witnessed Apneas  Epworth Sleepiness Score: 17  SLEEP STUDY TECHNIQUE As per the AASM Manual for the Scoring of Sleep and Associated Events v2.3 (April 2016) with a hypopnea requiring 4% desaturations.  The channels recorded and monitored were frontal, central and occipital EEG, electrooculogram (EOG), submentalis EMG (chin), nasal and oral airflow, thoracic and abdominal wall motion, anterior tibialis EMG, snore microphone, electrocardiogram, and pulse oximetry. Continuous positive airway pressure (CPAP) was initiated when the patient met split night criteria and was titrated according to treat sleep-disordered breathing.  MEDICATIONS Medications self-administered by patient taken the night of the study : N/A  RESPIRATORY PARAMETERS Diagnostic Total AHI (/hr): 78.3  RDI (/hr):78.3  OA Index (/hr): 69.7  CA Index (/hr): 0.0 REM AHI (/hr): N/A  NREM AHI (/hr):78.3  Supine AHI (/hr):90.7  Non-supine AHI (/hr):67.2 Min O2 Sat (%):62.0  Mean O2 (%): 86.1  Time below 88% (min):89.1   Titration Optimal Pressure (cm):N/A  AHI at Optimal Pressure (/hr):N/A  Min O2 at Optimal Pressure (%):N/A Supine % at Optimal (%):N/A  Sleep % at Optimal (%):100   SLEEP ARCHITECTURE The recording time for the entire night was 417.6 minutes.  During a baseline period of 190.9 minutes, the patient slept for 174.0 minutes in REM and nonREM, yielding a sleep efficiency of 91.1%. Sleep onset after lights out was 8.8 minutes with a REM latency of N/A minutes. The patient spent 1.4% of the  night in stage N1 sleep, 98.6% in stage N2 sleep, 0.0% in stage N3 and 0% in REM.  During the titration period of 205.1 minutes, the patient slept for 102.7 minutes in REM and nonREM, yielding a sleep efficiency of 50.1%. Sleep onset after CPAP initiation was 41.4 minutes with a REM latency of 47.5 minutes. The patient spent 1.9% of the night in stage N1 sleep, 65.4% in stage N2 sleep, 0.0% in stage N3 and 32.6% in REM.  CARDIAC DATA The 2 lead EKG demonstrated sinus rhythm. The mean heart rate was 100.0 beats per minute. Other EKG findings include: None.  LEG MOVEMENT DATA The total Periodic Limb Movements of Sleep (PLMS) were 0. The PLMS index was 0.0 .  IMPRESSIONS - Severe obstructive sleep apnea occurred during the diagnostic portion of the study (AHI = 78.3/hour). An optimal PAP pressure could not be selected due to ongoing sleep disordered breathing. - No significant central sleep apnea occurred during the diagnostic portion of the study (CAI = 0.0/hour). - Moderate oxygen desaturation was noted during the diagnostic portion of the study (Min O2 =62.0%). - No snoring was audible during the diagnostic portion of the study. - No cardiac abnormalities were noted during this study. - Clinically significant periodic limb movements did not occur during sleep.  DIAGNOSIS - Obstructive Sleep Apnea (327.23 [G47.33 ICD-10]) - Nocturnal Hypoxemia  RECOMMENDATIONS - Recommend repeat in lab study using BiPAP.  - Avoid alcohol, sedatives and other CNS depressants that may worsen sleep apnea and disrupt normal sleep architecture. - Sleep hygiene should be reviewed to assess factors that may improve sleep quality. - Weight management and regular exercise should be initiated or continued.  [Electronically signed] 01/08/2019 08:17 PM  Fransico Him MD, ABSM Diplomate, American Board of Sleep Medicine

## 2019-01-09 ENCOUNTER — Ambulatory Visit: Payer: BC Managed Care – PPO | Admitting: *Deleted

## 2019-01-11 ENCOUNTER — Telehealth: Payer: Self-pay | Admitting: *Deleted

## 2019-01-11 DIAGNOSIS — G4733 Obstructive sleep apnea (adult) (pediatric): Secondary | ICD-10-CM

## 2019-01-11 NOTE — Telephone Encounter (Signed)
-----   Message from Quintella Reichert, MD sent at 01/08/2019  8:20 PM EST ----- Please let patient know that they have sleep apnea but due to severity of apnea he had an unsuccessful CPAP titration.  I  recommend a repeat in lab study using BiPAP titration. Please set up titration in the sleep lab.

## 2019-01-11 NOTE — Telephone Encounter (Signed)
Informed patient of sleep study results and patient understanding was verbalized. Patient understands his sleep study showed  they have sleep apnea but due to severity of apnea he had an unsuccessful CPAP titration. I recommend a repeat in lab study using BiPAP titration. Please set up titration in the sleep lab.  Pt is aware and agreeable to results.  Bipap titration sent to pre cert

## 2019-02-24 ENCOUNTER — Other Ambulatory Visit: Payer: Self-pay | Admitting: Physician Assistant

## 2019-02-24 DIAGNOSIS — E118 Type 2 diabetes mellitus with unspecified complications: Secondary | ICD-10-CM

## 2019-02-25 NOTE — Telephone Encounter (Signed)
Requested Prescriptions  Pending Prescriptions Disp Refills  . metFORMIN (GLUCOPHAGE) 500 MG tablet [Pharmacy Med Name: METFORMIN 500MG TABLETS] 90 tablet 0    Sig: TAKE 1 TABLET(500 MG) BY MOUTH DAILY WITH BREAKFAST     Endocrinology:  Diabetes - Biguanides Passed - 02/24/2019  7:42 PM      Passed - Cr in normal range and within 360 days    Creat  Date Value Ref Range Status  10/05/2016 0.89 0.70 - 1.33 mg/dL Final    Comment:      For patients > or = 57 years of age: The upper reference limit for Creatinine is approximately 13% higher for people identified as African-American.      Creatinine, Ser  Date Value Ref Range Status  12/11/2018 0.98 0.76 - 1.27 mg/dL Final         Passed - HBA1C is between 0 and 7.9 and within 180 days    Hgb A1c MFr Bld  Date Value Ref Range Status  11/26/2018 6.4 (H) 4.8 - 5.6 % Final    Comment:    (NOTE) Pre diabetes:          5.7%-6.4% Diabetes:              >6.4% Glycemic control for   <7.0% adults with diabetes          Passed - eGFR in normal range and within 360 days    GFR, Est African American  Date Value Ref Range Status  01/06/2016 >89 >=60 mL/min Final   GFR calc Af Amer  Date Value Ref Range Status  12/11/2018 99 >59 mL/min/1.73 Final   GFR, Est Non African American  Date Value Ref Range Status  01/06/2016 85 >=60 mL/min Final    Comment:      The estimated GFR is a calculation valid for adults (>=59 years old) that uses the CKD-EPI algorithm to adjust for age and sex. It is   not to be used for children, pregnant women, hospitalized patients,    patients on dialysis, or with rapidly changing kidney function. According to the NKDEP, eGFR >89 is normal, 60-89 shows mild impairment, 30-59 shows moderate impairment, 15-29 shows severe impairment and <15 is ESRD.      GFR calc non Af Amer  Date Value Ref Range Status  12/11/2018 86 >59 mL/min/1.73 Final         Passed - Valid encounter within last 6 months   Recent Outpatient Visits          2 months ago Hospital discharge follow-up   Primary Care at [facility], Arlie Solomons, MD   3 months ago T wave inversion in EKG   [facility] at [facility], Lilia Argue, MD   3 months ago Essential hypertension   Primary Care at [facility], [place] D, PA-C   4 months ago Essential hypertension   Primary Care at [place], [place] D, PA-C   2 years ago Hypertension, unspecified type   Primary Care at [facility], Carroll Sage, FNP

## 2019-03-23 ENCOUNTER — Other Ambulatory Visit: Payer: Self-pay | Admitting: Family Medicine

## 2019-03-23 MED ORDER — AMLODIPINE BESYLATE 5 MG PO TABS: 5.0000 mg | ORAL_TABLET | Freq: Every day | ORAL | 1 refills | Status: DC

## 2019-06-09 ENCOUNTER — Other Ambulatory Visit: Payer: Self-pay | Admitting: Physician Assistant

## 2019-06-28 ENCOUNTER — Other Ambulatory Visit: Payer: Self-pay | Admitting: Family Medicine

## 2019-07-08 ENCOUNTER — Telehealth: Payer: Self-pay | Admitting: Cardiology

## 2019-07-08 NOTE — Telephone Encounter (Signed)
Reviewed with Dr. Radford Pax and pt needs to contact PCP regarding COVID testing. I placed call to pt and left message to call office as soon as possible. Appointment for 7/21 changed to virtual visit.

## 2019-07-08 NOTE — Telephone Encounter (Signed)
Thomas Yoder spoke with pt and cancelled appointment for tomorrow.

## 2019-07-08 NOTE — Telephone Encounter (Signed)
New Message         COVID-19 Pre-Screening Questions:   In the past 7 to 10 days have you had a cough,  shortness of breath, headache, congestion, fever (100 or greater) body aches, chills, sore throat, or sudden loss of taste or sense of smell?  SOB-Pt says he would like to be tested for COVID   Have you been around anyone with known Covid 19.  NO  Have you been around anyone who is awaiting Covid 19 test results in the past 7 to 10 days? NO  Have you been around anyone who has been exposed to Covid 19, or has mentioned symptoms of Covid 19 within the past 7 to 10 days? NO  If you have any concerns/questions about symptoms patients report during screening (either on the phone or at threshold). Contact the provider seeing the patient or DOD for further guidance.  If neither are available contact a member of the leadership team.

## 2019-07-08 NOTE — Telephone Encounter (Signed)
Pt is scheduled to see Dr. Radford Pax tomorrow. Visit will need to be cancelled or change to virtual visit.  Will forward to Dr Radford Pax to see if she would like to order COVID testing or have pt contact primary care I placed call to pt and left message to contact our office as soon as possible

## 2019-07-08 NOTE — Telephone Encounter (Signed)
Would you like to order COVID testing or should he contact primary care?

## 2019-07-08 NOTE — Telephone Encounter (Signed)
PCP?

## 2019-07-08 NOTE — Telephone Encounter (Signed)
Freada Bergeron, CMA  Freada Bergeron, CMA         I recommend a repeat in lab study using BiPAP titration. Please set up titration in the sleep lab.    Sent to precert for bipap titration

## 2019-07-08 NOTE — Telephone Encounter (Signed)
I am a reader tomorrow so my patients are all supposed to virtual

## 2019-07-09 ENCOUNTER — Telehealth: Payer: Self-pay | Admitting: *Deleted

## 2019-07-09 ENCOUNTER — Telehealth: Payer: BC Managed Care – PPO | Admitting: Cardiology

## 2019-07-09 NOTE — Telephone Encounter (Signed)
Staff message sent to Thomas Yoder T @ [employer] no PA is required for BIPAP titration. Plan does not participate with AIM. Ok to schedule.

## 2019-07-09 NOTE — Telephone Encounter (Signed)
-----   Message from Freada Bergeron, [place] sent at 07/08/2019 12:06 PM EDT ----- Regarding: precert Bipap titration ----- Message ----- From: Freada Bergeron, CMA Sent: 07/08/2019  12:05 PM EDT To: Freada Bergeron, CMA  Message from Sueanne Margarita, MD sent at 01/08/2019  8:20 PM EST ----- Please let patient know that they have sleep apnea but due to severity of apnea he had an unsuccessful CPAP titration.  I  recommend a repeat in lab study using BiPAP titration. Please set up titration in the sleep lab.

## 2019-07-15 ENCOUNTER — Telehealth: Payer: Self-pay | Admitting: *Deleted

## 2019-07-15 NOTE — Telephone Encounter (Signed)
Patient is scheduled for CPAP Titration on 07/24/19. Patient understands his titration study will be done at [facility] sleep lab. Patient understands he will receive a letter in a week or so detailing appointment, date, time, and location. Patient understands to call if he does not receive the letter  in a timely manner. He is scheduled for COVID screening on 8/3 prior to titration.   Patient agrees with treatment and thanked me for call.

## 2019-07-15 NOTE — Telephone Encounter (Signed)
-----   Message from Lauralee Evener, [place] sent at 07/09/2019  1:28 PM EDT ----- Regarding: RE: precert Per Jacinto Reap T. @ [employer] no PA is required. Ok to schedule.  ----- Message ----- From: Freada Bergeron, CMA Sent: 07/08/2019  12:06 PM EDT To: [facility] Sleep Studies Subject: precert                                        Bipap titration ----- Message ----- From: Freada Bergeron, CMA Sent: 07/08/2019  12:05 PM EDT To: Freada Bergeron, CMA  Message from Sueanne Margarita, MD sent at 01/08/2019  8:20 PM EST ----- Please let patient know that they have sleep apnea but due to severity of apnea he had an unsuccessful CPAP titration.  I  recommend a repeat in lab study using BiPAP titration. Please set up titration in the sleep lab.

## 2019-07-22 ENCOUNTER — Other Ambulatory Visit (HOSPITAL_COMMUNITY)
Admission: RE | Admit: 2019-07-22 | Discharge: 2019-07-22 | Disposition: A | Discharge status: Home / Self Care | Payer: BC Managed Care – PPO | Source: Ambulatory Visit | Attending: Cardiology | Admitting: Cardiology

## 2019-07-22 DIAGNOSIS — Z01812 Encounter for preprocedural laboratory examination: Secondary | ICD-10-CM | POA: Insufficient documentation

## 2019-07-22 DIAGNOSIS — Z20828 Contact with and (suspected) exposure to other viral communicable diseases: Secondary | ICD-10-CM | POA: Insufficient documentation

## 2019-07-22 LAB — SARS CORONAVIRUS 2 (TAT 6-24 HRS): SARS Coronavirus 2: NEGATIVE

## 2019-07-24 ENCOUNTER — Ambulatory Visit (HOSPITAL_BASED_OUTPATIENT_CLINIC_OR_DEPARTMENT_OTHER): Payer: BC Managed Care – PPO | Attending: Cardiology | Admitting: Cardiology

## 2019-07-24 ENCOUNTER — Other Ambulatory Visit: Payer: Self-pay

## 2019-07-24 ENCOUNTER — Encounter (HOSPITAL_BASED_OUTPATIENT_CLINIC_OR_DEPARTMENT_OTHER): Payer: Self-pay | Admitting: Cardiology

## 2019-07-24 DIAGNOSIS — R0902 Hypoxemia: Secondary | ICD-10-CM | POA: Insufficient documentation

## 2019-07-24 DIAGNOSIS — G4733 Obstructive sleep apnea (adult) (pediatric): Secondary | ICD-10-CM | POA: Diagnosis present

## 2019-07-24 HISTORY — DX: Obstructive sleep apnea (adult) (pediatric): G47.33

## 2019-07-25 NOTE — Procedures (Signed)
    Patient Name: Thomas Yoder, Thomas Yoder Date: 07/24/2019 Gender: Male D.O.B: Aug 17, 1961 Age (years): 58 Referring Provider: Fransico Him MD, ABSM Height (inches): 67 Interpreting Physician: Fransico Him MD, ABSM Weight (lbs): 230 RPSGT: Carolin Coy BMI: 36 MRN: [id number] Neck Size: 16.00  CLINICAL INFORMATION The patient is referred for a BiPAP titration to treat sleep apnea.  SLEEP STUDY TECHNIQUE As per the AASM Manual for the Scoring of Sleep and Associated Events v2.3 (April 2016) with a hypopnea requiring 4% desaturations.  The channels recorded and monitored were frontal, central and occipital EEG, electrooculogram (EOG), submentalis EMG (chin), nasal and oral airflow, thoracic and abdominal wall motion, anterior tibialis EMG, snore microphone, electrocardiogram, and pulse oximetry. Bilevel positive airway pressure (BPAP) was initiated at the beginning of the study and titrated to treat sleep-disordered breathing.  MEDICATIONS Medications self-administered by patient taken the night of the study : N/A  RESPIRATORY PARAMETERS Optimal IPAP Pressure (cm): 22  AHI at Optimal Pressure (/hr)5.7 Optimal EPAP Pressure (cm):18  Overall Minimal O2 (%):81.0  Minimal O2 at Optimal Pressure (%):94.0  SLEEP ARCHITECTURE Start Time:10:32:24 PM  Stop Time:5:14:41 AM  Total Time (min):402.3 Total Sleep Time (min):336.5 Sleep Latency (min):1.6  Sleep Efficiency (%): 83.6%  REM Latency (min):127.0  WASO (min):64.2 Stage N1 (%): 30.6%  Stage N2 (%): 56.0%  Stage N3 (%): 0.0%  Stage R (%): 13.4 Supine (%):83.06  Arousal Index (/hr):40.7  CARDIAC DATA The 2 lead EKG demonstrated sinus rhythm. The mean heart rate was 54.3 beats per minute. Other EKG findings include: PVCs.  LEG MOVEMENT DATA The total Periodic Limb Movements of Sleep (PLMS) were 0. The PLMS index was 0.0. A PLMS index of <15 is considered normal in adults.  IMPRESSIONS - An optimal PAP pressure was selected  for this patient ( 22/18 cm of water) - Central sleep apnea was not noted during this titration (CAI = 1.4/h). - Moderete oxygen desaturations were observed during this titration (min O2 = 81.0%). - The patient snored with moderate snoring volume. - No cardiac abnormalities were observed during this study. - Clinically significant periodic limb movements were not noted during this study. Arousals associated with PLMs were rare.  DIAGNOSIS - Obstructive Sleep Apnea (327.23 [G47.33 ICD-10]) - Nocturnal Hypoxemia  RECOMMENDATIONS - Trial of BiPAP therapy on 22/18 cm H2O with a Medium size Fisher&Paykel Full Face Mask Simplus mask and heated humidification. - Avoid alcohol, sedatives and other CNS depressants that may worsen sleep apnea and disrupt normal sleep architecture. - Sleep hygiene should be reviewed to assess factors that may improve sleep quality. - Weight management and regular exercise should be initiated or continued. - Return to Sleep Center for re-evaluation after 10 weeks of therapy  [Electronically signed] 07/25/2019 10:46 PM  Fransico Him MD, ABSM Diplomate, American Board of Sleep Medicine

## 2019-07-30 ENCOUNTER — Telehealth: Payer: Self-pay | Admitting: *Deleted

## 2019-07-30 NOTE — Telephone Encounter (Signed)
Informed patient of titration results and verbalized understanding was indicated. Patient understands his titration study showed they had a successful PAP titration and orders are in EPIC. Please set up 10 week OV with me.   Upon patient request DME selection is [facility]. Patient understands HE will be contacted by [facility] to set up HIS cpap. Patient understands to call if [facility] does not contact HIM with new setup in a timely manner. Patient understands they will be called once confirmation has been received from [facility] that they have received their new machine to schedule 10 week follow up appointment.  [facility] notified of new cpap order  Please add to airview Patient was grateful for the call and thanked me.

## 2019-07-30 NOTE — Telephone Encounter (Signed)
-----   Message from Sueanne Margarita, MD sent at 07/25/2019 10:52 PM EDT ----- Please let patient know that they had a successful PAP titration and let DME know that orders are in EPIC.  Please set up 10 week OV with me.

## 2019-08-03 ENCOUNTER — Other Ambulatory Visit: Payer: Self-pay | Admitting: Family Medicine

## 2019-08-03 NOTE — Telephone Encounter (Signed)
On 06/28/19 pt was already given #30 with note that he needs an appointment

## 2019-08-19 ENCOUNTER — Telehealth: Payer: Self-pay | Admitting: *Deleted

## 2019-08-19 DIAGNOSIS — G4733 Obstructive sleep apnea (adult) (pediatric): Secondary | ICD-10-CM

## 2019-08-19 NOTE — Telephone Encounter (Signed)
Order placed to [facility]. 

## 2019-08-19 NOTE — Telephone Encounter (Signed)
Patient called to say his pressure is blowing too strong once he falls asleep it feels like it is punching him in the face. He ask if it can be turned down one notch. Pressure is set on 22/18 H20.

## 2019-08-19 NOTE — Telephone Encounter (Signed)
-----   Message from Sueanne Margarita, MD sent at 08/19/2019 10:39 AM EDT ----- Regarding: RE: Pressure change Change to auto BIPAP with IPAP max 20cm , EPAP min 6cm and PS 5cm H2O  Get download in 2 week  Tarci ----- Message ----- From: Freada Bergeron, CMA Sent: 08/19/2019  10:35 AM EDT To: Sueanne Margarita, MD Subject: Pressure change                                Patient called to say his pressure is blowing too strong once he falls asleep it feels like it is punching him in the face. He ask if it can be turned down one notch. Pressure is set on 22/18 H20.  Please advise

## 2019-08-31 ENCOUNTER — Other Ambulatory Visit: Payer: Self-pay | Admitting: Family Medicine

## 2019-08-31 NOTE — Telephone Encounter (Signed)
Requested medication (s) are due for refill today: yes  Requested medication (s) are on the active medication list: yes  Last refill:  06/28/2019  Future visit scheduled: no  Notes to clinic:  Review for refill   Requested Prescriptions  Pending Prescriptions Disp Refills   lisinopril (ZESTRIL) 40 MG tablet [Pharmacy Med Name: LISINOPRIL 40MG  TABLETS] 30 tablet 0    Sig: TAKE 1 TABLET BY MOUTH ONCE DAILY     Cardiovascular:  ACE Inhibitors Failed - 08/31/2019 10:46 AM      Failed - Cr in normal range and within 180 days    Creat  Date Value Ref Range Status  10/05/2016 0.89 0.70 - 1.33 mg/dL Final    Comment:      For patients > or = 57 years of age: The upper reference limit for Creatinine is approximately 13% higher for people identified as African-American.      Creatinine, Ser  Date Value Ref Range Status  12/11/2018 0.98 0.76 - 1.27 mg/dL Final         Failed - K in normal range and within 180 days    Potassium  Date Value Ref Range Status  12/11/2018 4.1 3.5 - 5.2 mmol/L Final         Failed - Valid encounter within last 6 months    Recent Outpatient Visits          8 months ago Hospital discharge follow-up   [facility], MD   9 months ago T wave inversion in EKG   [facility], Lilia Argue, MD   10 months ago Essential hypertension   [facility], [place] D, PA-C   10 months ago Essential hypertension   [facility] [place], [place] D, PA-C   2 years ago Hypertension, unspecified type   [facility], Carroll Sage, FNP      Future Appointments            In 1 month Sueanne Margarita, MD [place], [place] - Patient is not pregnant      Passed - Last BP in normal range    BP Readings from Last 1 Encounters:  12/26/18 138/88

## 2019-09-15 ENCOUNTER — Other Ambulatory Visit: Payer: Self-pay | Admitting: Physician Assistant

## 2019-09-24 ENCOUNTER — Telehealth: Payer: Self-pay | Admitting: *Deleted

## 2019-09-24 NOTE — Telephone Encounter (Signed)

## 2019-09-25 ENCOUNTER — Other Ambulatory Visit: Payer: Self-pay

## 2019-09-25 ENCOUNTER — Ambulatory Visit: Payer: BC Managed Care – PPO | Admitting: Registered Nurse

## 2019-09-25 ENCOUNTER — Encounter: Payer: Self-pay | Admitting: Registered Nurse

## 2019-09-25 VITALS — BP 122/76 | HR 78 | Temp 98.7°F | Resp 16 | Wt 238.0 lb

## 2019-09-25 DIAGNOSIS — L918 Other hypertrophic disorders of the skin: Secondary | ICD-10-CM

## 2019-09-25 DIAGNOSIS — N529 Male erectile dysfunction, unspecified: Secondary | ICD-10-CM

## 2019-09-25 DIAGNOSIS — K921 Melena: Secondary | ICD-10-CM

## 2019-09-25 DIAGNOSIS — I10 Essential (primary) hypertension: Secondary | ICD-10-CM

## 2019-09-25 LAB — POC HEMOCCULT BLD/STL (OFFICE/1-CARD/DIAGNOSTIC): Fecal Occult Blood, POC: NEGATIVE

## 2019-09-25 MED ORDER — LISINOPRIL 40 MG PO TABS: 40.0000 mg | ORAL_TABLET | Freq: Every day | ORAL | 0 refills | Status: DC

## 2019-09-25 MED ORDER — SILDENAFIL CITRATE 20 MG PO TABS: 20.0000 mg | ORAL_TABLET | Freq: Three times a day (TID) | ORAL | 0 refills | Status: DC

## 2019-09-25 MED ORDER — HYDROCORTISONE ACETATE 1 % EX OINT: 1.0000 "application " | TOPICAL_OINTMENT | Freq: Two times a day (BID) | CUTANEOUS | 0 refills | Status: DC

## 2019-09-25 NOTE — Patient Instructions (Signed)
° ° ° °  If you have lab work done today you will be contacted with your lab results within the next 2 weeks.  If you have not heard from us then please contact us. The fastest way to get your results is to register for My Chart. ° ° °IF you received an x-ray today, you will receive an invoice from [facility]. Please contact [facility] at [phone] with questions or concerns regarding your invoice.  ° °IF you received labwork today, you will receive an invoice from [facility]. Please contact [facility] at [phone] with questions or concerns regarding your invoice.  ° °Our billing staff will not be able to assist you with questions regarding bills from these companies. ° °You will be contacted with the lab results as soon as they are available. The fastest way to get your results is to activate your My Chart account. Instructions are located on the last page of this paperwork. If you have not heard from us regarding the results in 2 weeks, please contact this office. °  ° ° ° °

## 2019-09-27 ENCOUNTER — Telehealth: Payer: Self-pay | Admitting: Family Medicine

## 2019-09-27 NOTE — Telephone Encounter (Signed)
Pharmacist from [place], [place] [facility] [address] states patient has been there 2x checking on the status of sildenafil (REVATIO) 20 MG tablet refill stating the directions need to state 1x daily, not 3 daily seeking a verbal order, please advise

## 2019-09-27 NOTE — Telephone Encounter (Signed)
Pt needs sildenafil (REVATIO) 20 MG tablet [  Called into pharmacy  [address] off of [address] .

## 2019-09-27 NOTE — Telephone Encounter (Signed)
Copied from [place] [id number]. Topic: General - Other >> Sep 27, 2019 12:39 PM Leward Quan A wrote: Reason for CRM: Patient called to say that Rx for sildenafil (REVATIO) 20 MG tablet has that incorrect information. It say take 3 times a day it need to state one time a day. [place], [place] - [address] [phone] (Phone) [phone] (Fax)   Patient can be reached at Ph# [phone]

## 2019-09-29 ENCOUNTER — Encounter: Payer: Self-pay | Admitting: Registered Nurse

## 2019-09-29 NOTE — Progress Notes (Signed)
Established Patient Office Visit  Subjective:  Patient ID: Thomas Yoder, male    DOB: 02-15-1961  Age: 58 y.o. MRN: [id number]  CC:  Chief Complaint  Patient presents with  . Blood In Stools    pt states he noticed some blood in his stool x 2 days ago and has been concerned.   . Erectile Dysfunction    pt states he noticed he is having trouble for some time and would like to try medication and see if it will help.   . Medication Refill    lisinopril     HPI Thomas Yoder presents for a number of concerns  Blood in stool: Pt had one instance of "light red" streaks in stool around 2 days ago. He is concerned - he reports that he is up to date on colonoscopy and results wnl. He denies pain before, during, or following defecation. He states that he has not had this happen before. He denies history of hemorrhoids.   Erectile Dysfunction: Reports ongoing trouble achieving and maintaining erection - he is able to discuss the effect that stress has had on this for him, but notes that it has been a problem that has increased with age. Has not tried pharmacological aid - is interested in trying an agent.  Medication refill: Needs refill on lisinopril. Denies sxs of HTN. Reports good effect and BP is wnl today. States PCP Dr. Nolon Rod has him on 3 mo f/u schedule.  Skin Tag: Notes a pedunculated skin tag on his lower right back. It has been present for years. Due to it's pedunculated nature, it is irritating to him. He is hoping to see if we can remove this in our office today.   Past Medical History:  Diagnosis Date  . CAD (coronary artery disease)    a. Cardiac CT showed calcium score of 83rd percentile, mild stenosis in the proximal LCx and the proximal and mid RCA, negative FFR for hemodynamic significance.  . Chest pain   . Chronic combined systolic and diastolic CHF (congestive heart failure) ([facility]) 12/11/2018  . Depression   . Diabetes mellitus without complication ([place])   . Former  tobacco use   . H/O noncompliance with medical treatment, presenting hazards to health   . Hyperlipidemia   . Hypertension   . NICM (nonischemic cardiomyopathy) ([facility])    a. EF 40-45% by echo 11/2018.  . Obesity (BMI 30-39.9)   . OSA (obstructive sleep apnea) 07/24/2019  . Snoring     Past Surgical History:  Procedure Laterality Date  . JOINT REPLACEMENT    . KNEE SURGERY    . 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 Hypertension Mother   . Diabetes Mother   . Hypertension Father   . CAD Neg Hx   . Stroke Neg Hx   . Cancer Neg Hx     Social History   Socioeconomic History  . Marital status: Single    Spouse name: Not on file  . Number of children: 3  . Years of education: Not on file  . Highest education level: Not on file  Occupational History  . Not on file  Social Needs  . Financial resource strain: Not on file  . Food insecurity    Worry: Not on file    Inability: Not on  file  . Transportation needs    Medical: Not on file    Non-medical: Not on file  Tobacco Use  . Smoking status: Former Smoker    Packs/day: 0.50    Years: 34.00    Pack years: 17.00    Types: Cigarettes  . Smokeless tobacco: Never Used  . Tobacco comment: 2-3 weeks  Substance and Sexual Activity  . Alcohol use: No  . Drug use: Yes    Types: Marijuana  . Sexual activity: Yes    Birth control/protection: Condom  Lifestyle  . Physical activity    Days per week: Not on file    Minutes per session: Not on file  . Stress: Not on file  Relationships  . Social Herbalist on phone: Not on file    Gets together: Not on file    Attends religious service: Not on file    Active member of club or organization: Not on file    Attends meetings of clubs or organizations: Not on file    Relationship status:  Not on file  . Intimate partner violence    Fear of current or ex partner: Not on file    Emotionally abused: Not on file    Physically abused: Not on file    Forced sexual activity: Not on file  Other Topics Concern  . Not on file  Social History Narrative  . Not on file    Outpatient Medications Prior to Visit  Medication Sig Dispense Refill  . amLODipine (NORVASC) 5 MG tablet Take 1 tablet (5 mg total) by mouth daily. 90 tablet 1  . aspirin EC 81 MG EC tablet Take 1 tablet (81 mg total) by mouth daily.    Marland Kitchen atorvastatin (LIPITOR) 80 MG tablet Take 1 tablet (80 mg total) by mouth daily. 30 tablet 11  . Blood Pressure Monitoring (ADULT BLOOD PRESSURE CUFF LG) KIT Check blood pressure daily and record readings. 1 each 0  . Blood Pressure Monitoring (BLOOD PRESSURE KIT) DEVI 1 Device by Does not apply route daily. 1 Device 0  . carvedilol (COREG) 12.5 MG tablet Take 1 tablet (12.5 mg total) by mouth 2 (two) times daily with a meal. Pt needs to keep upcoming appt in Oct for further refills 180 tablet 0  . dicyclomine (BENTYL) 20 MG tablet Take 1 tablet (20 mg total) by mouth 3 (three) times daily as needed for spasms. 21 tablet 0  . hydrochlorothiazide (HYDRODIURIL) 25 MG tablet Take 25 mg by mouth daily.    . metFORMIN (GLUCOPHAGE) 500 MG tablet TAKE 1 TABLET(500 MG) BY MOUTH DAILY WITH BREAKFAST 90 tablet 0  . lisinopril (ZESTRIL) 40 MG tablet Take 1 tablet (40 mg total) by mouth daily. SCHEDULE OFFICE VISIT 30 tablet 0   No facility-administered medications prior to visit.     No Known Allergies  ROS Review of Systems  Constitutional: Negative.   HENT: Negative.   Eyes: Negative.   Respiratory: Negative.   Cardiovascular: Negative.   Gastrointestinal: Positive for blood in stool. Negative for anal bleeding, constipation, diarrhea, nausea and rectal pain.  Endocrine: Negative.   Genitourinary: Negative.   Musculoskeletal: Negative.   Skin: Negative.        Skin tag lower  right back  Allergic/Immunologic: Negative.   Neurological: Negative.   Hematological: Negative.   Psychiatric/Behavioral: Negative.   All other systems reviewed and are negative.     Objective:    Physical Exam  Constitutional: He is oriented  to person, place, and time. He appears well-developed and well-nourished. No distress.  Cardiovascular: Normal rate, regular rhythm and normal heart sounds.  Pulmonary/Chest: Effort normal. No respiratory distress.  Abdominal: Soft. Bowel sounds are normal.  Genitourinary:    Rectum normal.  Rectum:     Guaiac result negative.   Neurological: He is alert and oriented to person, place, and time. No cranial nerve deficit.  Skin: Skin is warm and dry. No rash noted. He is not diaphoretic. No erythema. No pallor.     Psychiatric: He has a normal mood and affect. His behavior is normal. Judgment and thought content normal.  Nursing note reviewed.   BP 122/76   Pulse 78   Temp 98.7 F (37.1 C) (Oral)   Resp 16   Wt 238 lb (108 kg)   SpO2 99%   BMI 37.28 kg/m  Wt Readings from Last 3 Encounters:  09/25/19 238 lb (108 kg)  07/24/19 230 lb (104.3 kg)  01/03/19 240 lb (108.9 kg)     Health Maintenance Due  Topic Date Due  . PNEUMOCOCCAL POLYSACCHARIDE VACCINE AGE 52-64 HIGH RISK  05/08/1964  . OPHTHALMOLOGY EXAM  05/08/1972  . COLONOSCOPY  05/08/2012  . HEMOGLOBIN A1C  05/28/2019    There are no preventive care reminders to display for this patient.  Lab Results  Component Value Date   TSH 1.350 10/18/2018   Lab Results  Component Value Date   WBC 5.5 11/28/2018   HGB 13.9 11/28/2018   HCT 45.6 11/28/2018   MCV 79.9 (L) 11/28/2018   PLT 201 11/28/2018   Lab Results  Component Value Date   NA 139 12/11/2018   K 4.1 12/11/2018   CO2 25 12/11/2018   GLUCOSE 121 (H) 12/11/2018   BUN 16 12/11/2018   CREATININE 0.98 12/11/2018   BILITOT 0.6 10/26/2018   ALKPHOS 98 10/26/2018   AST 19 10/26/2018   ALT 17 10/26/2018    PROT 7.1 10/26/2018   ALBUMIN 3.8 10/26/2018   CALCIUM 9.3 12/11/2018   ANIONGAP 10 11/28/2018   Lab Results  Component Value Date   CHOL 204 (H) 11/27/2018   Lab Results  Component Value Date   HDL 25 (L) 11/27/2018   Lab Results  Component Value Date   LDLCALC 106 (H) 11/27/2018   Lab Results  Component Value Date   TRIG 364 (H) 11/27/2018   Lab Results  Component Value Date   CHOLHDL 8.2 11/27/2018   Lab Results  Component Value Date   HGBA1C 6.4 (H) 11/26/2018      Assessment & Plan:   Problem List Items Addressed This Visit    None    Visit Diagnoses    Blood in stool    -  Primary   Relevant Medications   Hydrocortisone Acetate 1 % OINT   Other Relevant Orders   POC Hemoccult Bld/Stl (1-Cd Office Dx) (Completed)   Erectile dysfunction, unspecified erectile dysfunction type       Relevant Medications   sildenafil (REVATIO) 20 MG tablet   Essential hypertension       Relevant Medications   lisinopril (ZESTRIL) 40 MG tablet   sildenafil (REVATIO) 20 MG tablet      Meds ordered this encounter  Medications  . lisinopril (ZESTRIL) 40 MG tablet    Sig: Take 1 tablet (40 mg total) by mouth daily.    Dispense:  90 tablet    Refill:  0    No refills without office visit  Order Specific Question:   Supervising Provider    Answer:   [name] [id number]  . Hydrocortisone Acetate 1 % OINT    Sig: Apply 1 application topically 2 (two) times daily.    Dispense:  28.4 g    Refill:  0    Order Specific Question:   Supervising Provider    Answer:   Delia Chimes A [id number]  . sildenafil (REVATIO) 20 MG tablet    Sig: Take 1 tablet (20 mg total) by mouth 3 (three) times daily.    Dispense:  30 tablet    Refill:  0    Order Specific Question:   Supervising Provider    Answer:   [name] [id number]    Follow-up: No follow-ups on file.   PLAN  Skin tag removed: local anesthesia with 2% lidocaine with epi, able to remove to base of  peduncle. No path ordered - this is almost assuredly benign skin tag based on history and exam. Pt in agreement with this plan. He has signed consent form prior to procedure. Dressed with dermabond, steristrip, sterile gauze and tegaderm - instructed to leave dressing in place for 48 hours, steristrips and dermabond until they fall off on their own. Discussed sxs of infection, pt demonstrates understanding.  Will try short rx of sildenafil 28m PO qd for ED, Pt given rx and Goodrx coupon.  Refill lisinopril x 3 mos - follow up with PCP  Negative guiac, only one occurrence, no hemorrhoids noted on digital rectal exam. Discussed options with patient, we will opt for hydrocortisone 1% cream application to rectum bid. Should his symptoms continue, we will order GI referral.  Patient encouraged to call clinic with any questions, comments, or concerns.   RMaximiano Coss NP

## 2019-09-30 ENCOUNTER — Other Ambulatory Visit: Payer: Self-pay | Admitting: Family Medicine

## 2019-09-30 DIAGNOSIS — N529 Male erectile dysfunction, unspecified: Secondary | ICD-10-CM

## 2019-09-30 MED ORDER — SILDENAFIL CITRATE 20 MG PO TABS: 20.0000 mg | ORAL_TABLET | Freq: Every day | ORAL | 0 refills | Status: DC | PRN

## 2019-10-01 ENCOUNTER — Telehealth: Payer: Self-pay | Admitting: *Deleted

## 2019-10-01 ENCOUNTER — Other Ambulatory Visit: Payer: Self-pay | Admitting: Registered Nurse

## 2019-10-01 DIAGNOSIS — N529 Male erectile dysfunction, unspecified: Secondary | ICD-10-CM

## 2019-10-01 MED ORDER — SILDENAFIL CITRATE 20 MG PO TABS: 20.0000 mg | ORAL_TABLET | Freq: Every day | ORAL | 0 refills | Status: DC | PRN

## 2019-10-01 NOTE — Telephone Encounter (Signed)
LVM to callback and go over meds an type of virtual visit

## 2019-10-01 NOTE — Progress Notes (Signed)
Error.  This encounter was created in error - please disregard. This encounter was created in error - please disregard. 

## 2019-10-02 ENCOUNTER — Other Ambulatory Visit: Payer: Self-pay

## 2019-10-02 ENCOUNTER — Encounter: Payer: BC Managed Care – PPO | Admitting: Cardiology

## 2019-10-02 NOTE — Telephone Encounter (Signed)
I spoke with pt. He did not receive voicemail that visit was virtual until this morning.  He is unavailable to do visit at this time as he is driving to office.  He would like to reschedule.  Will have scheduling team contact him later.

## 2019-10-02 NOTE — Telephone Encounter (Signed)
rx has been corrected and sent to pharmacy

## 2019-10-03 ENCOUNTER — Telehealth: Payer: Self-pay | Admitting: *Deleted

## 2019-10-03 NOTE — Telephone Encounter (Signed)
Informed patient of compliance results and verbalized understanding was indicated. Patient is aware and agreeable to AHI being within range at 1.0. Patient is aware and agreeable to being in compliance with machine usage Patient is aware and agreeable to no change in current pressures.  

## 2019-10-03 NOTE — Telephone Encounter (Signed)
-----   Message from Sueanne Margarita, MD sent at 10/01/2019 10:53 AM EDT ----- Good AHI on PAP but needs to improve compliance

## 2019-10-09 NOTE — Progress Notes (Signed)
Virtual Visit via Video Note   This visit type was conducted due to national recommendations for restrictions regarding the COVID-19 Pandemic (e.g. social distancing) in an effort to limit this patient's exposure and mitigate transmission in our community.  Due to his co-morbid illnesses, this patient is at least at moderate risk for complications without adequate follow up.  This format is felt to be most appropriate for this patient at this time.  All issues noted in this document were discussed and addressed.  A limited physical exam was performed with this format.  Please refer to the patient's chart for his consent to telehealth for [facility].   Evaluation Performed:  Follow-up visit  This visit type was conducted due to national recommendations for restrictions regarding the COVID-19 Pandemic (e.g. social distancing).  This format is felt to be most appropriate for this patient at this time.  All issues noted in this document were discussed and addressed.  No physical exam was performed (except for noted visual exam findings with Video Visits).  Please refer to the patient's chart (MyChart message for video visits and phone note for telephone visits) for the patient's consent to telehealth for [facility].  Date:  10/10/2019   ID:  [name], DOB [birth year], MRN [id number]  Patient Location:  Home  Provider location:   [place]  PCP:  [name], MD  Cardiologist:  Fransico Him, MD  Electrophysiologist:  None   Chief Complaint:  OSA, HTN, DCM  History of Present Illness:    Jos Cygan is a 58 y.o. male who presents via audio/video conferencing for a telehealth visit today.    Abimelec Grochowski is a 58 y.o. male with history of DM, HTN, intermittent noncompliance, tobacco abuse, snoring, NICM, nonobstructive CAD by CT 11/2018.  He has been see by Melina Copa, PA the last few OVs for titration of BP meds. He underwent sleep study last January showing severe OSA  with an AHi of 78/hr and subsequent underwent PAP titration and is now on BiPAP at 22/18cm H2O.    He is here today for followup and is doing well.  He denies any chest pain or pressure, SOB, DOE, PND, orthopnea dizziness, palpitations or syncope. He occasionally has some LE edema when he eats too much Na.  He is compliant with his meds and is tolerating meds with no SE.  He is doing well with his PAP device and thinks that he has gotten used to it.  He tolerates the full face mask but feels the pressure is too high.  Since going on PAP he feels rested in the am and has no significant daytime sleepiness.  He denies any significant mouth or nasal dryness or nasal congestion.  He does not think that he snores.    The patient does not have symptoms concerning for COVID-19 infection (fever, chills, cough, or new shortness of breath).   Prior CV studies:   The following studies were reviewed today:  PAP compliance download  Past Medical History:  Diagnosis Date   CAD (coronary artery disease)    a. Cardiac CT showed calcium score of 83rd percentile, mild stenosis in the proximal LCx and the proximal and mid RCA, negative FFR for hemodynamic significance.   Chest pain    Chronic combined systolic and diastolic CHF (congestive heart failure) ([place]) 12/11/2018   Depression    Diabetes mellitus without complication ([place])    Former tobacco use    H/O noncompliance with medical treatment, presenting  hazards to health    Hyperlipidemia    Hypertension    NICM (nonischemic cardiomyopathy) ([place])    a. EF 40-45% by echo 11/2018.   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   aspirin EC 81 MG EC tablet Take 1 tablet (81 mg total) by mouth daily.   atorvastatin (LIPITOR) 80 MG tablet Take 1 tablet (80 mg total) by mouth daily.   Blood Pressure Monitoring (ADULT BLOOD PRESSURE CUFF LG) KIT Check blood pressure daily and record readings.   Blood Pressure Monitoring (BLOOD PRESSURE KIT) DEVI 1 Device by Does not apply route daily.   carvedilol (COREG) 12.5 MG tablet Take 1 tablet (12.5 mg total) by mouth 2 (two) times daily with a meal. Pt needs to keep upcoming appt in Oct for further refills   dicyclomine (BENTYL) 20 MG tablet Take 1 tablet (20 mg total) by mouth 3 (three) times daily as needed for spasms.   hydrochlorothiazide (HYDRODIURIL) 25 MG tablet Take 25 mg by mouth daily.   Hydrocortisone Acetate 1 % OINT Apply 1 application topically 2 (two) times daily.   lisinopril (ZESTRIL) 40 MG tablet Take 1 tablet (40 mg total) by mouth daily.   metFORMIN (GLUCOPHAGE) 500 MG tablet TAKE 1 TABLET(500 MG) BY MOUTH DAILY WITH BREAKFAST   sildenafil (REVATIO) 20 MG tablet Take 1 tablet (20 mg total) by mouth daily as needed.     Allergies:   Patient has no known allergies.   Social History   Tobacco Use   Smoking status: Former Smoker    Packs/day: 0.50    Years: 34.00    Pack years: 17.00    Types: Cigarettes   Smokeless tobacco: Never Used   Tobacco comment: 2-3 weeks  Substance Use Topics   Alcohol use: No   Drug use: Yes    Types: Marijuana     Family Hx: The patient's family history includes Diabetes in his mother; Hypertension in his father and mother. There is no history of CAD, Stroke, or Cancer.  ROS:   Please see the history of present illness.     All other systems reviewed and are negative.   Labs/Other Tests and Data Reviewed:    Recent Labs: 10/18/2018: TSH 1.350 10/26/2018: ALT 17 11/28/2018: Hemoglobin 13.9; Platelets  201 12/11/2018: BUN 16; Creatinine, Ser 0.98; Potassium 4.1; Sodium 139   Recent Lipid Panel Lab Results  Component Value Date/Time   CHOL 204 (H) 11/27/2018 12:28 AM   CHOL 199 10/18/2018 02:52 PM   TRIG 364 (H) 11/27/2018 12:28 AM   HDL 25 (L) 11/27/2018 12:28 AM   HDL 31 (L) 10/18/2018 02:52 PM   CHOLHDL 8.2 11/27/2018 12:28 AM   LDLCALC 106 (H) 11/27/2018 12:28 AM   LDLCALC 105 (H) 10/18/2018 02:52 PM    Wt Readings from Last 3 Encounters:  10/10/19 237 lb (107.5 kg)  09/25/19 238 lb (108 kg)  07/24/19 230 lb (104.3 kg)     Objective:    Vital Signs:  Ht '5\' 7"'  (  1.702 m)    Wt 237 lb (107.5 kg)    BMI 37.12 kg/m    CONSTITUTIONAL:  Well nourished, well developed male in no acute distress.  EYES: anicteric MOUTH: oral mucosa is pink RESPIRATORY: Normal respiratory effort, symmetric expansion CARDIOVASCULAR: No peripheral edema SKIN: No rash, lesions or ulcers MUSCULOSKELETAL: no digital cyanosis NEURO: Cranial Nerves II-XII grossly intact, moves all extremities PSYCH: Intact judgement and insight.  A&O x 3, Mood/affect appropriate   ASSESSMENT & PLAN:    1.  OSA - The pathophysiology of obstructive sleep apnea , it's cardiovascular consequences & modes of treatment including CPAP were discussd with the patient in detail & they evidenced understanding.  The patient is tolerating PAP therapy well without any problems. The PAP download was reviewed today and showed an AHI of 1.4/hr on 22/18 cm H2O with 0% compliance in using more than 4 hours nightly.  The patient has been using and benefiting from PAP use and will continue to benefit from therapy.   He feels the pressure it too high so I will change him to Auto BiPAP and get a download in 2 weeks and see me back in 4 weeks to see how he is doing  2.  HTN -BP 122/19mHg -continue Carvedilol 12.53mBID, HCTZ 2594maily, Lisinopril 77m64mily -increase amlodipine to 10mg42mly -check BP daily for a week and bring in to HTN  clinic -Follow in HTN clinic in 1 week  3.  NICM/Chronic combined systolic/diastolic CHF -He denies any SOB or LE edema -weight has been stable -likely related to HTN CM -coronary CTA with nonobstructive CAD -continue BB and ACE I -EF 40-45% on echo 11/2018 -Check BMET  4.  Nonobstructive CAD -denies any angina sx -continue statin  5.  HLD -LDL goal < 70 with nonobstructive CAD and DM -check FLP and ALT -continue atorvastatin 80mg 57my   COVID-19 Education: The signs and symptoms of COVID-19 were discussed with the patient and how to seek care for testing (follow up with PCP or arrange E-visit).  The importance of social distancing was discussed today.  Patient Risk:   After full review of this patient's clinical status, I feel that they are at least moderate risk at this time.  Time:   Today, I have spent 20 minutes directly with the patient on telemedicine discussing medical problems including OSA, HTN, CAD, HLD.  We also reviewed the symptoms of COVID 19 and the ways to protect against contracting the virus with telehealth technology.  I spent an additional 5 minutes reviewing patient's chart including labs and PAP compliance download.  Medication Adjustments/Labs and Tests Ordered: Current medicines are reviewed at length with the patient today.  Concerns regarding medicines are outlined above.  Tests Ordered: No orders of the defined types were placed in this encounter.  Medication Changes: No orders of the defined types were placed in this encounter.   Disposition:  Follow up in 4 week(s) virtual with TT  Signed, Kamarrion Stfort Fransico Him10/22/2020 7:53 AM    [facility]

## 2019-10-10 ENCOUNTER — Telehealth: Payer: Self-pay | Admitting: *Deleted

## 2019-10-10 ENCOUNTER — Other Ambulatory Visit: Payer: Self-pay

## 2019-10-10 ENCOUNTER — Telehealth (INDEPENDENT_AMBULATORY_CARE_PROVIDER_SITE_OTHER): Payer: BC Managed Care – PPO | Admitting: Cardiology

## 2019-10-10 VITALS — Ht 67.0 in | Wt 237.0 lb

## 2019-10-10 DIAGNOSIS — I10 Essential (primary) hypertension: Secondary | ICD-10-CM | POA: Diagnosis not present

## 2019-10-10 DIAGNOSIS — E785 Hyperlipidemia, unspecified: Secondary | ICD-10-CM

## 2019-10-10 DIAGNOSIS — G4733 Obstructive sleep apnea (adult) (pediatric): Secondary | ICD-10-CM | POA: Diagnosis not present

## 2019-10-10 DIAGNOSIS — I5042 Chronic combined systolic (congestive) and diastolic (congestive) heart failure: Secondary | ICD-10-CM

## 2019-10-10 DIAGNOSIS — I2510 Atherosclerotic heart disease of native coronary artery without angina pectoris: Secondary | ICD-10-CM

## 2019-10-10 MED ORDER — AMLODIPINE BESYLATE 10 MG PO TABS: 10.0000 mg | ORAL_TABLET | Freq: Every day | ORAL | 11 refills | Status: DC

## 2019-10-10 NOTE — Telephone Encounter (Signed)
I spoke with pt and scheduled Hypertension clinic appt for 8:30 on 10/29. Fasting labs the same day. Virtual appointment made with Dr Radford Pax for November 30,2020 at 8:20.

## 2019-10-10 NOTE — Patient Instructions (Addendum)
Medication Instructions:  Your physician has recommended you make the following change in your medication: Increase amlodipine to 10 mg by mouth daily.   *If you need a refill on your cardiac medications before your next appointment, please call your pharmacy*  Lab Work: Your physician recommends that you return for lab work on day of appointment with Hypertension clinic.  This will be fasting-FLP/ALT/BMET.  If you have labs (blood work) drawn today and your tests are completely normal, you will receive your results only by: Marland Kitchen MyChart Message (if you have MyChart) OR . A paper copy in the mail If you have any lab test that is abnormal or we need to change your treatment, we will call you to review the results.  Testing/Procedures: none  Follow-Up: At [facility], you and your health needs are our priority.  As part of our continuing mission to provide you with exceptional heart care, we have created designated Provider Care Teams.  These Care Teams include your primary Cardiologist (physician) and Advanced Practice Providers (APPs -  Physician Assistants and Nurse Practitioners) who all work together to provide you with the care you need, when you need it.  Your next appointment:   4 weeks with Dr Radford Pax.   The format for your next appointment:   Virtual Visit   Provider:   Fransico Him, MD  Other Instructions   You have been referred to the Hypertension clinic in our office.   Please check your blood pressure and pulse(heart rate) daily for a week and keep record of readings.  Check about 2 hours after taking morning medications.  Bring these readings to your Hypertension clinic appointment.

## 2019-10-10 NOTE — Telephone Encounter (Signed)
Pt had virtual visit with Dr Radford Pax today. (see AVS)  I called pt to discuss changes and schedule appointments.  Pt to be seen in hypertension clinic in one week.  He is to have fasting lab work the same day.  He is to follow up virtually with Dr Radford Pax in 4 months. These appointments need to be scheduled.  Increased dose of amlodipine sent to [facility] on [address]. I left message for pt to call office.

## 2019-10-11 ENCOUNTER — Telehealth: Payer: Self-pay | Admitting: *Deleted

## 2019-10-11 DIAGNOSIS — G4733 Obstructive sleep apnea (adult) (pediatric): Secondary | ICD-10-CM

## 2019-10-11 NOTE — Telephone Encounter (Signed)
Thomas Margarita, MD  Freada Bergeron, CMA  Change BiPAP to Auto with IPAP max 20cm H2O and EPAP min 6cm H2O and PS 5cm H2O and get a download in 4 weeks   Order faxed to [facility]

## 2019-10-14 ENCOUNTER — Other Ambulatory Visit: Payer: Self-pay | Admitting: *Deleted

## 2019-10-14 DIAGNOSIS — Z20822 Contact with and (suspected) exposure to covid-19: Secondary | ICD-10-CM

## 2019-10-15 LAB — NOVEL CORONAVIRUS, NAA: SARS-CoV-2, NAA: NOT DETECTED

## 2019-10-16 NOTE — Progress Notes (Signed)
Patient ID: Thomas Yoder                 DOB: 1961-05-11                      MRN: [id number]     HPI: Thomas Yoder is a 58 y.o. male referred by Dr. Radford Pax to HTN clinic. PMH is significant for DM, HTN, intermittent noncompliance, tobacco abuse,snoring, NICM, nonobstructive CAD by CT 11/2018. Patient was last seen by Dr. Radford Pax on 10/10/19. Diastolic BP was elevated at 122/98 mmHg and amlodipine was increased from 5 mg to 10 mg daily. Pt was continued on carvedilol 12.73m BID, HCTZ 260mdaily, lisinopril 4017maily. Dr. TurRadford Paxcouraged patient to check BP daily for a week and bring readings to next visit. Patient referred to the HTN clinic for BP management.   Patient presents today in good spirits to the HTN clinic. Clinic BP is 124/90 mmHg today. Patient reports taking amlodipine 10 mg daily, HCTZ 25 mg daily, carvedilol 12.5mg89mnd lisinopril 40 mg daily all in the morning. Patient reports that he sometimes forgets to take the evening dose of carvedilol 12.5 mg. Patient denies dizziness, headaches and blurred vision. Patient reports history of swelling when he eats too much salt. Patient did not bring home BP readings as requested. Patient's diet consist of sausage, bacon, egg, cheese, pancakes, potatoes, toast, french toast, pork chops, mac and cheese, rice, baked chicken, and water. Patient reports that he does not exercise as frequently as he should.  Current HTN meds: amlodipine 10 mg, carvedilol 12.5mg 33m, HCTZ 25mg 69my, lisinopril 40mg d58m Previously tried: metoprolol succinate 50 mg daily, metoprolol tartrate 25 mg BID  BP goal: < 130/80 mmHg   Family History: Diabetes in his mother; Hypertension in his father and mother  Social History: former smoker - 0.5 ppd for 34 years, marijuana use,   Diet: sausage, bacon, egg, cheese, pancakes, potatoes, toast, french toast, pork chops, mac and cheese, rice, baked chicken, and water.  Exercise: does not exercise as frequently as he  should.  Home BP readings: none  Wt Readings from Last 3 Encounters:  10/10/19 237 lb (107.5 kg)  09/25/19 238 lb (108 kg)  07/24/19 230 lb (104.3 kg)   BP Readings from Last 3 Encounters:  09/25/19 122/76  12/26/18 138/88  12/21/18 118/78   Pulse Readings from Last 3 Encounters:  09/25/19 78  12/26/18 72  12/21/18 77    Renal function: CrCl cannot be calculated (Patient's most recent lab result is older than the maximum 21 days allowed.).  Past Medical History:  Diagnosis Date  . CAD (coronary artery disease)    a. Cardiac CT showed calcium score of 83rd percentile, mild stenosis in the proximal LCx and the proximal and mid RCA, negative FFR for hemodynamic significance.  . Chest pain   . Chronic combined systolic and diastolic CHF (congestive heart failure) ([facility]) [place]/2019  . Depression   . Diabetes mellitus without complication ([facility])   [place] tobacco use   . H/O noncompliance with medical treatment, presenting hazards to health   . Hyperlipidemia   . Hypertension   . NICM (nonischemic cardiomyopathy) ([facility])   [place] EF 40-45% by echo 11/2018.  . Obesity (BMI 30-39.9)   . OSA (obstructive sleep apnea) 07/24/2019  . Snoring     Current Outpatient Medications on File Prior to Visit  Medication Sig Dispense Refill  . amLODipine (NORVASC) 10 MG tablet Take 1 tablet (10  mg total) by mouth daily. 30 tablet 11  . aspirin EC 81 MG EC tablet Take 1 tablet (81 mg total) by mouth daily.    Marland Kitchen atorvastatin (LIPITOR) 80 MG tablet Take 1 tablet (80 mg total) by mouth daily. 30 tablet 11  . Blood Pressure Monitoring (ADULT BLOOD PRESSURE CUFF LG) KIT Check blood pressure daily and record readings. 1 each 0  . Blood Pressure Monitoring (BLOOD PRESSURE KIT) DEVI 1 Device by Does not apply route daily. 1 Device 0  . carvedilol (COREG) 12.5 MG tablet Take 1 tablet (12.5 mg total) by mouth 2 (two) times daily with a meal. Pt needs to keep upcoming appt in Oct for further refills 180 tablet  0  . dicyclomine (BENTYL) 20 MG tablet Take 1 tablet (20 mg total) by mouth 3 (three) times daily as needed for spasms. 21 tablet 0  . hydrochlorothiazide (HYDRODIURIL) 25 MG tablet Take 25 mg by mouth daily.    . Hydrocortisone Acetate 1 % OINT Apply 1 application topically 2 (two) times daily. 28.4 g 0  . lisinopril (ZESTRIL) 40 MG tablet Take 1 tablet (40 mg total) by mouth daily. 90 tablet 0  . metFORMIN (GLUCOPHAGE) 500 MG tablet TAKE 1 TABLET(500 MG) BY MOUTH DAILY WITH BREAKFAST 90 tablet 0  . sildenafil (REVATIO) 20 MG tablet Take 1 tablet (20 mg total) by mouth daily as needed. 30 tablet 0   No current facility-administered medications on file prior to visit.     No Known Allergies   Assessment/Plan:  1. Hypertension - Clinic BP was 124/90 mmHg. Switch amlodipine 10 mg in the morning to the evening for better BP control. Continue lisinopril 40 mg daily, hydrochlorothiazide 25 mg daily, and carvedilol 12.5 mg BID. Dicussed the importance of taking BP evening meds. Discussed the importance of checking BP at home and instructed patient to check BP once every day and bring these readings along with his BP machine to next follow-up visit. Discused the importance of eating a low-carb, low-salt diet, limiting red-meat and salt consumption, and incorporating more vegetables and fruits. Encouraged patient to exercise for 15-20 mins every other day with the goal of 30 mins every day.   Follow-up in HTN in clinic in 4 weeks  Lorel Monaco, PharmD PGY1 [facility] [address], [place], [place] [address] Phone: [phone]; Fax: [phone]

## 2019-10-17 ENCOUNTER — Other Ambulatory Visit: Payer: Self-pay

## 2019-10-17 ENCOUNTER — Other Ambulatory Visit: Payer: BC Managed Care – PPO | Admitting: *Deleted

## 2019-10-17 ENCOUNTER — Ambulatory Visit (INDEPENDENT_AMBULATORY_CARE_PROVIDER_SITE_OTHER): Payer: BC Managed Care – PPO | Admitting: Pharmacist

## 2019-10-17 ENCOUNTER — Institutional Professional Consult (permissible substitution): Payer: BC Managed Care – PPO

## 2019-10-17 VITALS — BP 124/90 | HR 65

## 2019-10-17 DIAGNOSIS — I2510 Atherosclerotic heart disease of native coronary artery without angina pectoris: Secondary | ICD-10-CM

## 2019-10-17 DIAGNOSIS — I10 Essential (primary) hypertension: Secondary | ICD-10-CM | POA: Diagnosis not present

## 2019-10-17 DIAGNOSIS — E785 Hyperlipidemia, unspecified: Secondary | ICD-10-CM

## 2019-10-17 DIAGNOSIS — I5042 Chronic combined systolic (congestive) and diastolic (congestive) heart failure: Secondary | ICD-10-CM

## 2019-10-17 LAB — BASIC METABOLIC PANEL
BUN/Creatinine Ratio: 21 — ABNORMAL HIGH (ref 9–20)
BUN: 20 mg/dL (ref 6–24)
CO2: 26 mmol/L (ref 20–29)
Calcium: 9.6 mg/dL (ref 8.7–10.2)
Chloride: 100 mmol/L (ref 96–106)
Creatinine, Ser: 0.97 mg/dL (ref 0.76–1.27)
GFR calc Af Amer: 100 mL/min/{1.73_m2} (ref >59)
GFR calc non Af Amer: 86 mL/min/{1.73_m2} (ref >59)
Glucose: 110 mg/dL — ABNORMAL HIGH (ref 65–99)
Potassium: 4 mmol/L (ref 3.5–5.2)
Sodium: 142 mmol/L (ref 134–144)

## 2019-10-17 LAB — LIPID PANEL
Chol/HDL Ratio: 3.4 ratio (ref 0.0–5.0)
Cholesterol, Total: 122 mg/dL (ref 100–199)
HDL: 36 mg/dL — ABNORMAL LOW (ref >39)
LDL Chol Calc (NIH): 54 mg/dL (ref 0–99)
Triglycerides: 194 mg/dL — ABNORMAL HIGH (ref 0–149)
VLDL Cholesterol Cal: 32 mg/dL (ref 5–40)

## 2019-10-17 LAB — ALT: ALT: 13 IU/L (ref 0–44)

## 2019-10-17 NOTE — Patient Instructions (Addendum)
Nice to see you today!  Keep up the good work with diet and exercise. Aim for a diet full of vegetables, fruit and lean meats (chicken, [place], fish). Try to limit salt intake by eating fresh or frozen vegetables (instead of canned), rinse canned vegetables prior to cooking and do not add any additional salt to meals.   Your goal blood pressure is <130/80 mmHg. In clinic, your blood pressure was 124/90 mmHg.  Medication Changes: Start taking Amlodipine 10 mg in the evening   Continue taking Lisinopril 40 mg daily in the morning Continue taking Hydrochlorothiazide 25 mg daily in the morning Continue taking Carvedilol 12.5 mg once in the morning and once in the evening  Monitor blood pressure at home daily and keep a log (on your phone or piece of paper) to bring with you to your next visit. Write down date, time, blood pressure and pulse.  Your next follow-up visit with be on Tuesday, November 24th at 10:30AM  Please give [place] a call at [phone] with any questions or concerns.

## 2019-10-18 ENCOUNTER — Telehealth: Payer: Self-pay | Admitting: Pharmacist

## 2019-10-18 MED ORDER — VASCEPA 1 G PO CAPS: 2.0000 | ORAL_CAPSULE | Freq: Two times a day (BID) | ORAL | 11 refills | Status: DC

## 2019-10-18 NOTE — Addendum Note (Signed)
Addended by: Marcelle Overlie D on: 10/18/2019 12:38 PM   Modules accepted: Orders

## 2019-10-18 NOTE — Telephone Encounter (Signed)
Reviewed labs with patient. Discussed diet. Patient does drink a small amount of soda per day. Encouraged him to stop drinking soda and limits sweets/carbs.  Discussed the cardiovascular risk reduction with Vascepa and the TG lowering. Patient agreeable to start. Will send Rx over to pharmacy. Will call to see if needs PA and will call in copay card. Will call patient with info once known

## 2019-10-18 NOTE — Telephone Encounter (Signed)
Called and left VM on patient machine to return call.  Calling to discuss lipid lab results. LDL is at goal on atorvastatin 80mg . TG are much improved from 10 months ago, however still >150. Will discuss diet/exercise vs adding Vascepa 2g BID for TG lowering and added cardiovascular risk reduction.

## 2019-10-20 ENCOUNTER — Other Ambulatory Visit: Payer: Self-pay | Admitting: Physician Assistant

## 2019-10-21 MED ORDER — ICOSAPENT ETHYL 1 G PO CAPS: 2.0000 | ORAL_CAPSULE | Freq: Two times a day (BID) | ORAL | 3 refills | Status: DC

## 2019-10-21 NOTE — Telephone Encounter (Signed)
PA for Vascepa approved through 10/21/2022.  Copay card called into [facility] Patient made aware.

## 2019-10-21 NOTE — Telephone Encounter (Signed)
Requested medication (s) are due for refill today: yes  Requested medication (s) are on the active medication list: yes  Last refill:  06/28/2019  Future visit scheduled: no  Notes to clinic:  Last filled by historical provider    Requested Prescriptions  Pending Prescriptions Disp Refills   hydrochlorothiazide (HYDRODIURIL) 25 MG tablet [Pharmacy Med Name: HYDROCHLOROTHIAZIDE 25MG  TABLETS] 90 tablet     Sig: TAKE 1 TABLET(25 MG) BY MOUTH DAILY     Cardiovascular: Diuretics - Thiazide Failed - 10/20/2019  7:21 AM      Failed - Last BP in normal range    BP Readings from Last 1 Encounters:  10/17/19 124/90         Passed - Ca in normal range and within 360 days    Calcium  Date Value Ref Range Status  10/17/2019 9.6 8.7 - 10.2 mg/dL Final         Passed - Cr in normal range and within 360 days    Creat  Date Value Ref Range Status  10/05/2016 0.89 0.70 - 1.33 mg/dL Final    Comment:      For patients > or = 57 years of age: The upper reference limit for Creatinine is approximately 13% higher for people identified as African-American.      Creatinine, Ser  Date Value Ref Range Status  10/17/2019 0.97 0.76 - 1.27 mg/dL Final         Passed - K in normal range and within 360 days    Potassium  Date Value Ref Range Status  10/17/2019 4.0 3.5 - 5.2 mmol/L Final         Passed - Na in normal range and within 360 days    Sodium  Date Value Ref Range Status  10/17/2019 142 134 - 144 mmol/L Final         Passed - Valid encounter within last 6 months    Recent Outpatient Visits          3 weeks ago Blood in stool   [facility] [place], NP   9 months ago Hospital discharge follow-up   [facility], Arlie Solomons, MD   10 months ago T wave inversion in EKG   [facility], Lilia Argue, MD   11 months ago Essential hypertension   [facility] [place], [place] D, PA-C   1 year ago Essential hypertension   [facility] [place], [place] D, PA-C      Future Appointments            In 3 weeks  [place]   In 4 weeks [place], Eber Hong, MD [place], [facility]

## 2019-10-21 NOTE — Addendum Note (Signed)
Addended by: Marcelle Overlie D on: 10/21/2019 10:25 AM   Modules accepted: Orders

## 2019-10-21 NOTE — Telephone Encounter (Signed)
PA for Vascepa submitted to [facility]. Will await response from insurance.

## 2019-10-26 IMAGING — CR DG CHEST 2V
2 series · 2 of 2 positions shown · non-contrast
Comparison: 02/15/2017 and 10/24/2016.

CLINICAL DATA: Left flank pain and left chest pain. Shortness of
breath, cough and congestion.

EXAM:
CHEST - 2 VIEW

[chest pa]
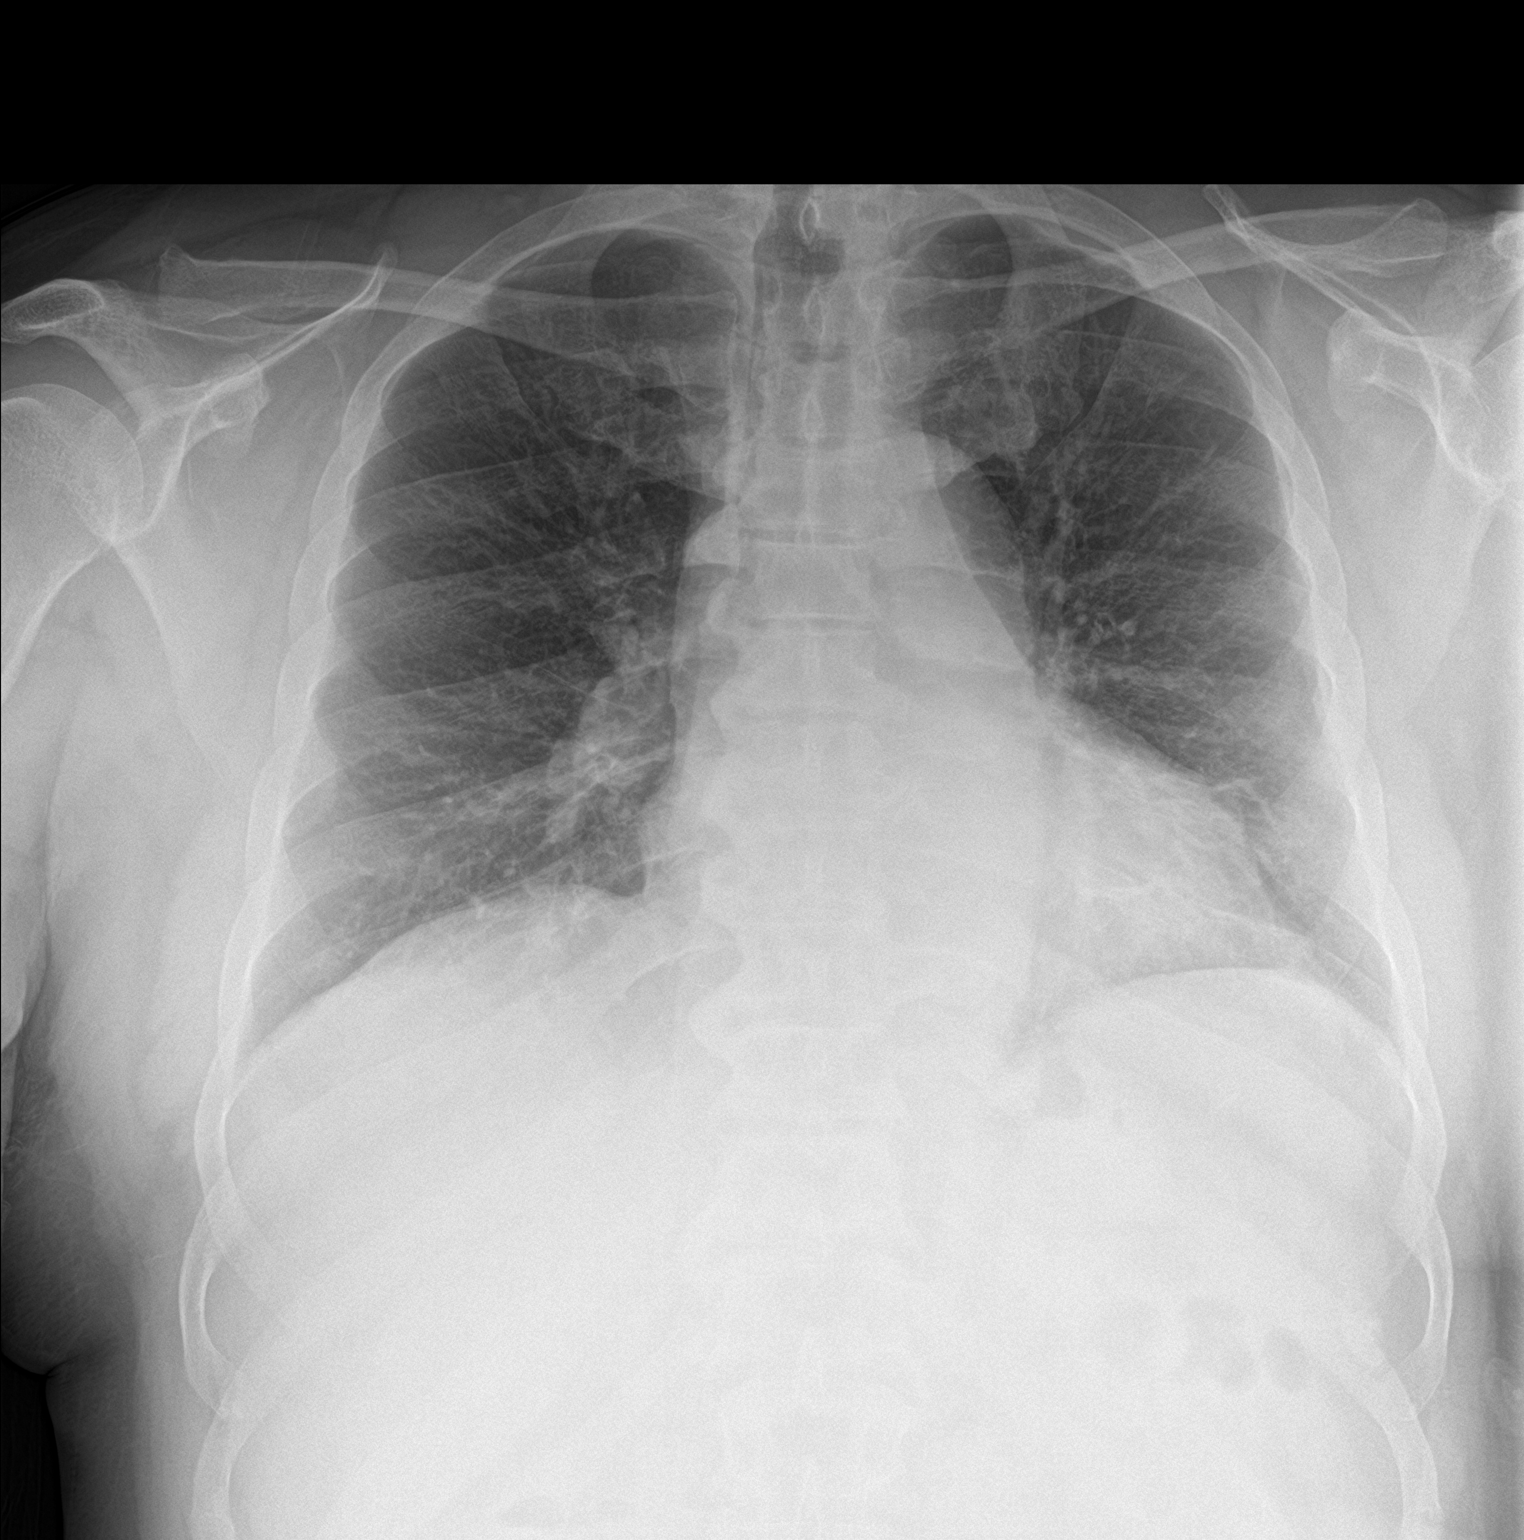

[chest lat]
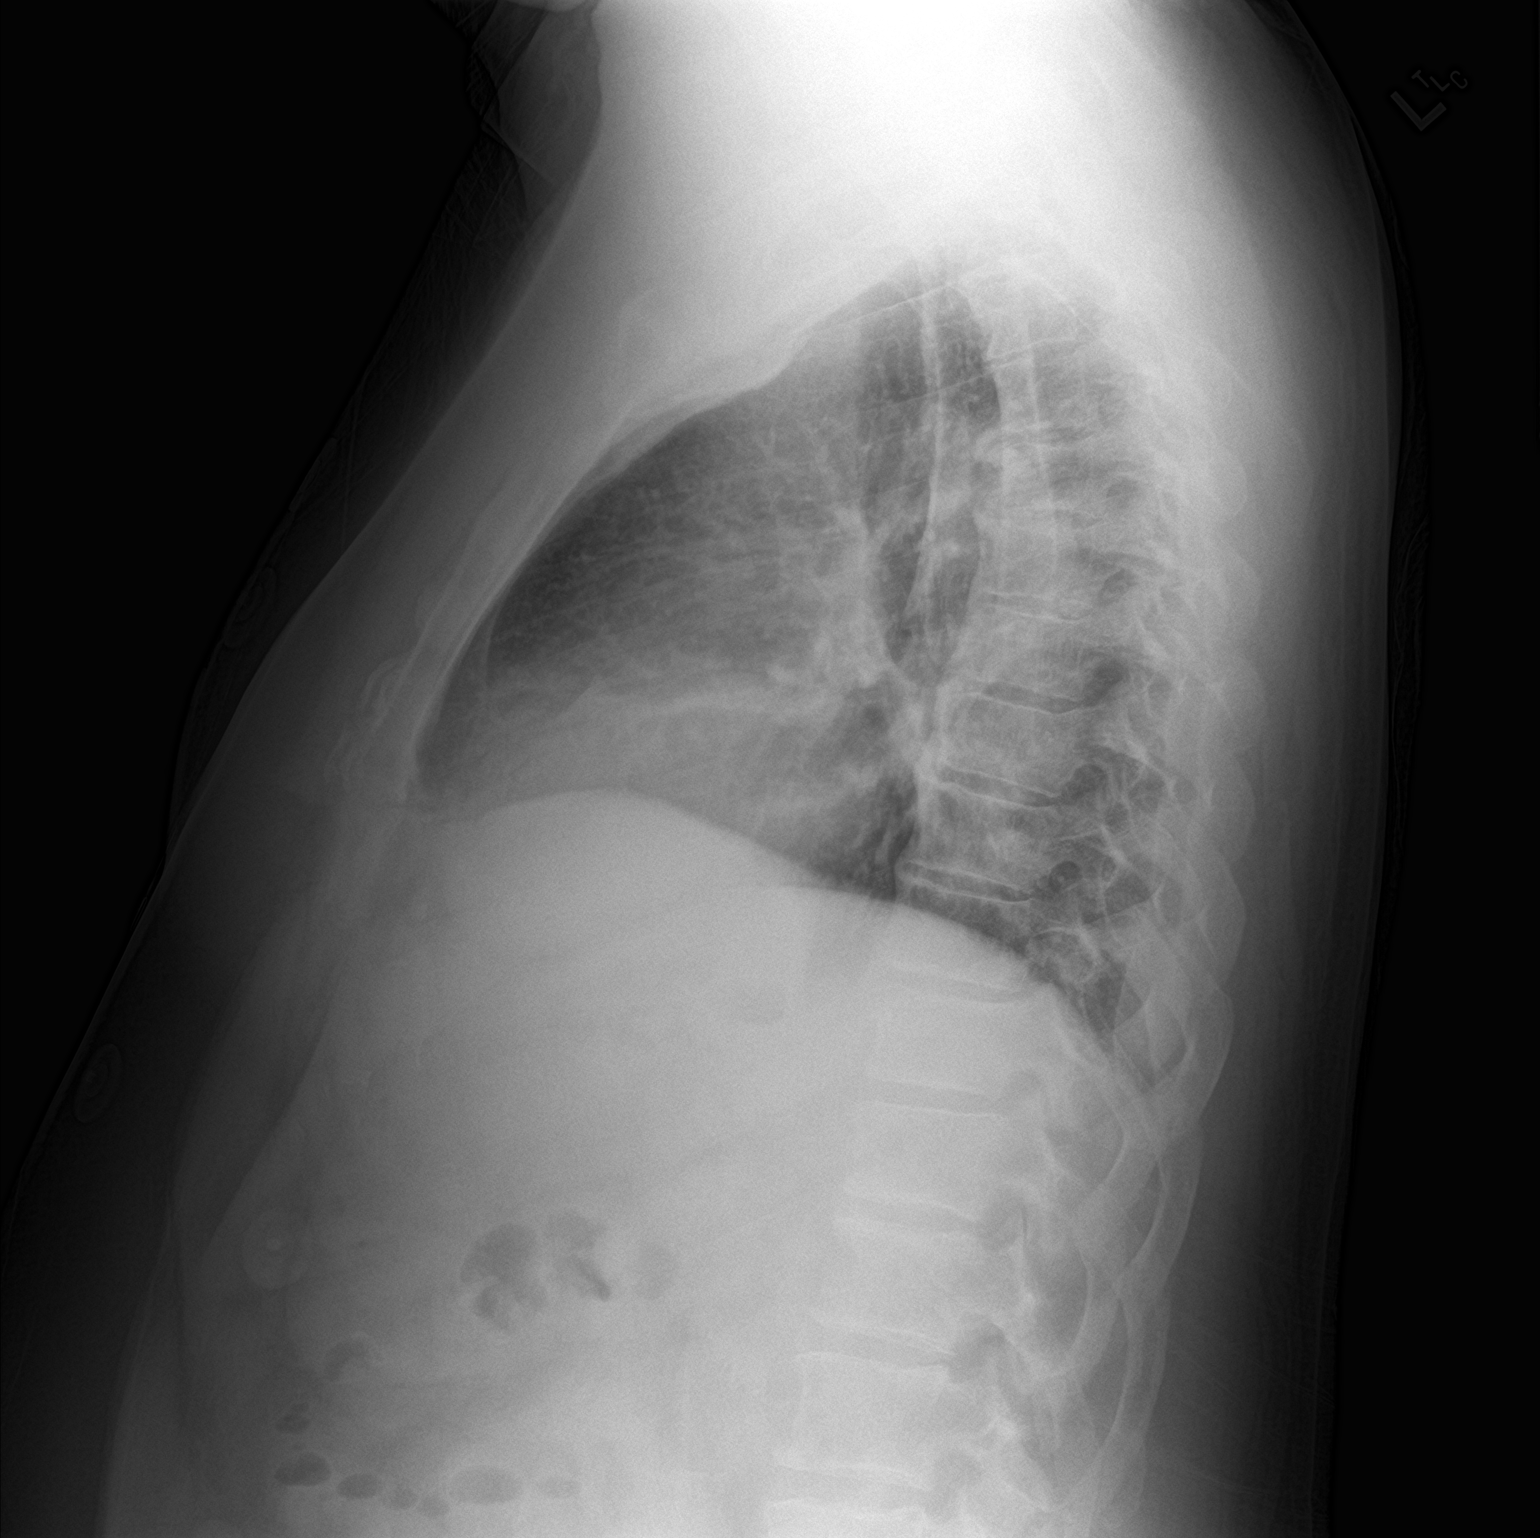

[2 of 2 positions shown; findings below may reference images not displayed]

FINDINGS: Trachea is midline. Heart is enlarged, stable. Mild right hilar
prominence, unchanged. Linear subsegmental atelectasis or scarring
in the lung bases, left greater than right. Lungs are otherwise
clear. Image quality on the lateral view is degraded by motion.
IMPRESSION: No acute findings.

## 2019-11-11 NOTE — Progress Notes (Unsigned)
Patient ID: Thomas Yoder                 DOB: 08/18/1961                      MRN: [id number]     HPI: Kinser Fellman is a 58 y.o. male referred by Dr. Radford Pax to HTN clinic. PMH is significant for DM, HTN, intermittent noncompliance, tobacco abuse,snoring, NICM, nonobstructive CAD by CT 11/2018.   He was last seen by HTN clinic on 10/17/19 and his BP was 124/90. He was continued on his current medication regimen of amlodipine 10 mg daily, lisinopril 40 mg daily, HCTZ 25 mg daily, and carvedilol 12.5 mg BID but was instructed to start taking his amlodipine in the evening. He was encouraged to monitor home BP and bring a log to the next visit.   Patient presents to HTN clinic for follow up.   -spiro -tolerating Vascepa?  Current HTN meds: amlodipine 10 mg daily in the evening, carvedilol 12.5 mg BID, HCTZ 25 mg daily in the morning, lisinopril 40 mg daily in the morning Previously tried: metoprolol succinate 50 mg daily, metoprolol tartrate 25 mg BID BP goal: <130/80  Family History: Diabetes in his mother; Hypertension in his father and mother  Social History: former smoker - 0.5 ppd for 34 years, marijuana use  Diet: sausage, bacon, egg, cheese, pancakes, potatoes, toast, french toast, pork chops, mac and cheese, rice, baked chicken, and water.  Exercise: does not exercise as frequently as he should  Home BP readings:   Wt Readings from Last 3 Encounters:  10/10/19 237 lb (107.5 kg)  09/25/19 238 lb (108 kg)  07/24/19 230 lb (104.3 kg)   BP Readings from Last 3 Encounters:  10/17/19 124/90  09/25/19 122/76  12/26/18 138/88   Pulse Readings from Last 3 Encounters:  10/17/19 65  09/25/19 78  12/26/18 72    Renal function: CrCl cannot be calculated (Patient's most recent lab result is older than the maximum 21 days allowed.).  Past Medical History:  Diagnosis Date  . CAD (coronary artery disease)    a. Cardiac CT showed calcium score of 83rd percentile, mild stenosis in  the proximal LCx and the proximal and mid RCA, negative FFR for hemodynamic significance.  . Chest pain   . Chronic combined systolic and diastolic CHF (congestive heart failure) ([place]) 12/11/2018  . Depression   . Diabetes mellitus without complication ([place])   . Former tobacco use   . H/O noncompliance with medical treatment, presenting hazards to health   . Hyperlipidemia   . Hypertension   . NICM (nonischemic cardiomyopathy) ([place])    a. EF 40-45% by echo 11/2018.  . Obesity (BMI 30-39.9)   . OSA (obstructive sleep apnea) 07/24/2019  . Snoring     Current Outpatient Medications on File Prior to Visit  Medication Sig Dispense Refill  . amLODipine (NORVASC) 10 MG tablet Take 1 tablet (10 mg total) by mouth daily. 30 tablet 11  . aspirin EC 81 MG EC tablet Take 1 tablet (81 mg total) by mouth daily.    Marland Kitchen atorvastatin (LIPITOR) 80 MG tablet Take 1 tablet (80 mg total) by mouth daily. 30 tablet 11  . Blood Pressure Monitoring (ADULT BLOOD PRESSURE CUFF LG) KIT Check blood pressure daily and record readings. 1 each 0  . Blood Pressure Monitoring (BLOOD PRESSURE KIT) DEVI 1 Device by Does not apply route daily. 1 Device 0  . carvedilol (COREG) 12.5  MG tablet Take 1 tablet (12.5 mg total) by mouth 2 (two) times daily with a meal. Pt needs to keep upcoming appt in Oct for further refills 180 tablet 0  . dicyclomine (BENTYL) 20 MG tablet Take 1 tablet (20 mg total) by mouth 3 (three) times daily as needed for spasms. 21 tablet 0  . hydrochlorothiazide (HYDRODIURIL) 25 MG tablet TAKE 1 TABLET(25 MG) BY MOUTH DAILY 90 tablet 0  . Hydrocortisone Acetate 1 % OINT Apply 1 application topically 2 (two) times daily. 28.4 g 0  . Icosapent Ethyl 1 g CAPS Take 2 capsules (2 g total) by mouth 2 (two) times daily. 360 capsule 3  . lisinopril (ZESTRIL) 40 MG tablet Take 1 tablet (40 mg total) by mouth daily. 90 tablet 0  . metFORMIN (GLUCOPHAGE) 500 MG tablet TAKE 1 TABLET(500 MG) BY MOUTH DAILY WITH BREAKFAST  90 tablet 0  . sildenafil (REVATIO) 20 MG tablet Take 1 tablet (20 mg total) by mouth daily as needed. 30 tablet 0   No current facility-administered medications on file prior to visit.     No Known Allergies   Assessment/Plan:  1. Hypertension -   Vertis Kelch, PharmD PGY2 Cardiology Pharmacy Resident [place] [address] [place], [place] [address] Phone: [phone]; Fax: [phone]

## 2019-11-12 ENCOUNTER — Ambulatory Visit: Payer: BC Managed Care – PPO | Admitting: Pharmacist

## 2019-11-17 NOTE — Progress Notes (Signed)
Virtual Visit via Video Note   This visit type was conducted due to national recommendations for restrictions regarding the COVID-19 Pandemic (e.g. social distancing) in an effort to limit this patient's exposure and mitigate transmission in our community.  Due to his co-morbid illnesses, this patient is at least at moderate risk for complications without adequate follow up.  This format is felt to be most appropriate for this patient at this time.  All issues noted in this document were discussed and addressed.  A limited physical exam was performed with this format.  Please refer to the patient's chart for his consent to telehealth for [facility].   Evaluation Performed:  Follow-up visit  This visit type was conducted due to national recommendations for restrictions regarding the COVID-19 Pandemic (e.g. social distancing).  This format is felt to be most appropriate for this patient at this time.  All issues noted in this document were discussed and addressed.  No physical exam was performed (except for noted visual exam findings with Video Visits).  Please refer to the patient's chart (MyChart message for video visits and phone note for telephone visits) for the patient's consent to telehealth for [facility].  Date:  11/18/2019   ID:  Thomas Yoder, DOB 1961-10-30, MRN [id number]  Patient Location:  Home  Provider location:   [place]  PCP:  [name], MD  Cardiologist:  Fransico Him, MD  Electrophysiologist:  None   Chief Complaint:  OSA, HTN  History of Present Illness:    Thomas Yoder is a 58 y.o. male who presents via audio/video conferencing for a telehealth visit today.    Thomas Yoder a 58 y.o.malewith history ofDM, HTN, intermittent noncompliance, tobacco abuse,snoring, NICM, nonobstructive CAD by CT 11/2018.  He has been see by Melina Copa, PA the last few OVs for titration of BP meds. He underwent sleep study last January showing severe OSA with an  AHi of 78/hr and subsequent underwent PAP titration and is now on BiPAP at 22/18cm H2O.    I saw him a few weeks ago by telemedicine and felt that his PAP pressure was too high and therefore was not compliant.  I changed him to auto PAP and he is now back to see how he is doing.    He is doing well with his PAP device and thinks that he has gotten used to it.  He tolerates the mask and feels the pressure is adequate but his mask has been leaking air and then he feels that there is not enough pressure.  Since going on PAP he feels rested in the am and has no significant daytime sleepiness.  He denies any significant mouth or nasal dryness or nasal congestion.  He does not think that he snores.   The patient does not have symptoms concerning for COVID-19 infection (fever, chills, cough, or new shortness of breath).    Prior CV studies:   The following studies were reviewed today:  PAP compliance download  Past Medical History:  Diagnosis Date  . CAD (coronary artery disease)    a. Cardiac CT showed calcium score of 83rd percentile, mild stenosis in the proximal LCx and the proximal and mid RCA, negative FFR for hemodynamic significance.  . Chest pain   . Chronic combined systolic and diastolic CHF (congestive heart failure) ([place]) 12/11/2018  . Depression   . Diabetes mellitus without complication ([place])   . Former tobacco use   . H/O noncompliance with medical treatment, presenting hazards  to health   . Hyperlipidemia   . Hypertension   . NICM (nonischemic cardiomyopathy) ([place])    a. EF 40-45% by echo 11/2018.  . Obesity (BMI 30-39.9)   . OSA (obstructive sleep apnea) 07/24/2019  . Snoring    Past Surgical History:  Procedure Laterality Date  . JOINT REPLACEMENT    . KNEE SURGERY    . 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 amLODipine (NORVASC) 10 MG tablet Take 1 tablet (10 mg total) by mouth daily.  Marland Kitchen aspirin EC 81 MG EC tablet Take 1 tablet (81 mg total) by mouth daily.  Marland Kitchen atorvastatin (LIPITOR) 80 MG tablet Take 1 tablet (80 mg total) by mouth daily.  . Blood Pressure Monitoring (ADULT BLOOD PRESSURE CUFF LG) KIT Check blood pressure daily and record readings.  . Blood Pressure Monitoring (BLOOD PRESSURE KIT) DEVI 1 Device by Does not apply route daily.  . carvedilol (COREG) 12.5 MG tablet Take 1 tablet (12.5 mg total) by mouth 2 (two) times daily with a meal. Pt needs to keep upcoming appt in Oct for further refills  . dicyclomine (BENTYL) 20 MG tablet Take 1 tablet (20 mg total) by mouth 3 (three) times daily as needed for spasms.  . hydrochlorothiazide (HYDRODIURIL) 25 MG tablet TAKE 1 TABLET(25 MG) BY MOUTH DAILY  . Hydrocortisone Acetate 1 % OINT Apply 1 application topically 2 (two) times daily.  Vanessa Kick Ethyl 1 g CAPS Take 2 capsules (2 g total) by mouth 2 (two) times daily.  Marland Kitchen lisinopril (ZESTRIL) 40 MG tablet Take 1 tablet (40 mg total) by mouth daily.  . metFORMIN (GLUCOPHAGE) 500 MG tablet TAKE 1 TABLET(500 MG) BY MOUTH DAILY WITH BREAKFAST  . sildenafil (REVATIO) 20 MG tablet Take 1 tablet (20 mg total) by mouth daily as needed.     Allergies:   Patient has no known allergies.   Social History   Tobacco Use  . Smoking status: Former Smoker    Packs/day: 0.50    Years: 34.00    Pack years: 17.00    Types: Cigarettes  . Smokeless tobacco: Never Used  . Tobacco comment: 2-3 weeks  Substance Use Topics  . Alcohol use: No  . Drug use: Yes    Types: Marijuana     Family Hx: The patient's family history includes Diabetes in his mother; Hypertension in his father and mother. There is no history of CAD, Stroke, or Cancer.  ROS:   Please see the history of present illness.     All other systems reviewed and are negative.   Labs/Other Tests and Data Reviewed:    Recent Labs:  11/28/2018: Hemoglobin 13.9; Platelets 201 10/17/2019: ALT 13; BUN 20; Creatinine, Ser 0.97; Potassium 4.0; Sodium 142   Recent Lipid Panel Lab Results  Component Value Date/Time   CHOL 122 10/17/2019 10:53 AM   TRIG 194 (H) 10/17/2019 10:53 AM   HDL 36 (L) 10/17/2019 10:53 AM   CHOLHDL 3.4 10/17/2019 10:53 AM   CHOLHDL 8.2 11/27/2018 12:28 AM   LDLCALC 54 10/17/2019 10:53 AM    Wt Readings from Last 3 Encounters:  11/18/19 220 lb (99.8 kg)  10/10/19 237 lb (107.5 kg)  09/25/19 238 lb (108 kg)     Objective:    Vital Signs:  Ht '5\' 7"'  (1.702 m)  Wt 220 lb (99.8 kg)   BMI 34.46 kg/m    CONSTITUTIONAL:  Well nourished, well developed male in no acute distress.  EYES: anicteric MOUTH: oral mucosa is pink RESPIRATORY: Normal respiratory effort, symmetric expansion CARDIOVASCULAR: No peripheral edema SKIN: No rash, lesions or ulcers MUSCULOSKELETAL: no digital cyanosis NEURO: Cranial Nerves II-XII grossly intact, moves all extremities PSYCH: Intact judgement and insight.  A&O x 3, Mood/affect appropriate   ASSESSMENT & PLAN:    1.  OSA -  The patient is tolerating PAP therapy well without any problems. The PAP download was reviewed today and showed an AHI of 11.1/hr on auto with 0% compliance in using more than 4 hours nightly.  The patient has been using and benefiting from PAP use and will continue to benefit from therapy. He has not been compliant due to issues with a bad mask leak.  He called the DME to get a new cushion and they told him they could not get him another one because he was not compliant but he has not been compliant because his mask leaks so bad.  He wants to be more compliant and would like more time to show that he is compliant.  I will call his DME to get a new cushion and then repeat a download 4 weeks.    2.  HTN -continue Carvedilol 12.72m BID, HCTZ 28mdaily and Lisinopril 4042maily  3.  Obesity -I have encouraged him to get into a routine  exercise program and cut back on carbs and portions.   COVID-19 Education: The signs and symptoms of COVID-19 were discussed with the patient and how to seek care for testing (follow up with PCP or arrange E-visit).  The importance of social distancing was discussed today.  Patient Risk:   After full review of this patient's clinical status, I feel that they are at least moderate risk at this time.  Time:   Today, I have spent 20 minutes directly with the patient on telemedicine discussing medical problems including OSA, HTN, obesity.  We also reviewed the symptoms of COVID 19 and the ways to protect against contracting the virus with telehealth technology.  I spent an additional 5 minutes reviewing patient's chart including PAP compliance download.  Medication Adjustments/Labs and Tests Ordered: Current medicines are reviewed at length with the patient today.  Concerns regarding medicines are outlined above.  Tests Ordered: No orders of the defined types were placed in this encounter.  Medication Changes: No orders of the defined types were placed in this encounter.   Disposition:  Follow up 3 months  Signed, TraFransico HimD  11/18/2019 8:24 AM    [place]

## 2019-11-18 ENCOUNTER — Telehealth (INDEPENDENT_AMBULATORY_CARE_PROVIDER_SITE_OTHER): Payer: BC Managed Care – PPO | Admitting: Cardiology

## 2019-11-18 ENCOUNTER — Other Ambulatory Visit: Payer: Self-pay

## 2019-11-18 VITALS — Ht 67.0 in | Wt 220.0 lb

## 2019-11-18 DIAGNOSIS — G4733 Obstructive sleep apnea (adult) (pediatric): Secondary | ICD-10-CM | POA: Diagnosis not present

## 2019-11-18 DIAGNOSIS — E669 Obesity, unspecified: Secondary | ICD-10-CM

## 2019-11-18 DIAGNOSIS — I10 Essential (primary) hypertension: Secondary | ICD-10-CM

## 2019-11-19 ENCOUNTER — Telehealth: Payer: Self-pay | Admitting: *Deleted

## 2019-11-19 NOTE — Telephone Encounter (Signed)
Reached out to dme and Ivin Booty states patient has not been compliant even a little bit showing 0% usage. DME states they have no documentation of the patient calling in having device problems therefore they have nothing to show insurance to save his unit. They will pick his unit up today at 3:30.

## 2019-11-19 NOTE — Telephone Encounter (Signed)
Please find out from the patient if he wants to start the process all over

## 2019-11-19 NOTE — Telephone Encounter (Signed)
-----   Message from Sueanne Margarita, MD sent at 11/18/2019  8:28 AM EST ----- Patient has not been compliant due mask issues with severe leaking.  He needs a new cushion. He wants to be compliant but right now the mask is leaking so bad he cannot use it.  Please tell the DME that he has been struggling with a mask leak and plans on being compliant once we can get his mask working.  He had issues on a set BIPAP pressure and we had to change the pressure settings and insurance needs to give him more time to get acclimated.  Followup with me in 3 months

## 2019-11-21 ENCOUNTER — Telehealth: Payer: Self-pay | Admitting: *Deleted

## 2019-11-21 NOTE — Telephone Encounter (Signed)
DME reports they will pick up patients machine for non-conpliance. Reached out to dme and Ivin Booty states patient has not been compliant even a little bit showing 0% usage. DME states they have no documentation of the patient calling in having device problems therefore they have nothing to show insurance to save his unit. They will pick his unit up today at 3:30.

## 2019-11-21 NOTE — Telephone Encounter (Signed)
-----   Message from Frederik Schmidt, RN sent at 11/18/2019  9:12 AM EST -----  ----- Message ----- From: Sueanne Margarita, MD Sent: 11/13/2019  11:11 PM EST To: [name], MD, Cv Div [facility] Triage  AHI too high and it looks like is mask is leaking badly.  Please set up appt with DME to check mask fit and then repeat download in 2 weeks.  Please encourage patient not to sleep on his back

## 2019-11-22 NOTE — Telephone Encounter (Signed)
Called patient and lmtcb if he wants to start the sleep process over again.

## 2019-12-22 ENCOUNTER — Other Ambulatory Visit: Payer: Self-pay | Admitting: Registered Nurse

## 2019-12-22 DIAGNOSIS — I10 Essential (primary) hypertension: Secondary | ICD-10-CM

## 2019-12-23 ENCOUNTER — Other Ambulatory Visit: Payer: Self-pay | Admitting: *Deleted

## 2019-12-23 MED ORDER — CARVEDILOL 12.5 MG PO TABS: 12.5000 mg | ORAL_TABLET | Freq: Two times a day (BID) | ORAL | 0 refills | Status: DC

## 2019-12-27 ENCOUNTER — Telehealth: Payer: Self-pay | Admitting: *Deleted

## 2019-12-27 DIAGNOSIS — G4733 Obstructive sleep apnea (adult) (pediatric): Secondary | ICD-10-CM

## 2019-12-27 NOTE — Telephone Encounter (Signed)
Patient states NO he never got a new mask and [facility] picked up his machine. Patient says he is ready to start over again.

## 2019-12-27 NOTE — Telephone Encounter (Signed)
-----   Message from Quintella Reichert, MD sent at 12/24/2019  8:18 PM EST ----- Please find out if patient ever got a new mask

## 2019-12-28 NOTE — Telephone Encounter (Signed)
Please find out from DME if he needs a repeat PSG

## 2019-12-30 NOTE — Telephone Encounter (Signed)
Patient says he is ready to start over again.

## 2020-01-02 NOTE — Telephone Encounter (Signed)
Please order

## 2020-01-10 ENCOUNTER — Other Ambulatory Visit: Payer: Self-pay | Admitting: Family Medicine

## 2020-01-10 MED ORDER — ATORVASTATIN CALCIUM 80 MG PO TABS: 80.0000 mg | ORAL_TABLET | Freq: Every day | ORAL | 1 refills | Status: DC

## 2020-01-13 ENCOUNTER — Other Ambulatory Visit: Payer: Self-pay

## 2020-01-13 DIAGNOSIS — E785 Hyperlipidemia, unspecified: Secondary | ICD-10-CM

## 2020-01-13 NOTE — Progress Notes (Signed)
Labs ordered.

## 2020-01-13 NOTE — Telephone Encounter (Signed)
NPSG  sent to sleep pool °

## 2020-01-17 ENCOUNTER — Telehealth: Payer: Self-pay | Admitting: *Deleted

## 2020-01-17 NOTE — Telephone Encounter (Signed)
Staff message sent to [name] ok to schedule NSPG. Per Bruna Potter S with [employer] no PA is required.

## 2020-01-21 ENCOUNTER — Other Ambulatory Visit: Payer: BC Managed Care – PPO

## 2020-01-21 ENCOUNTER — Telehealth: Payer: Self-pay | Admitting: *Deleted

## 2020-01-21 NOTE — Telephone Encounter (Signed)
-----   Message from Gaynelle Cage, CMA sent at 01/17/2020 12:00 PM EST ----- Regarding: RE: precert Per Bruna Potter S @[employer]  no PA is required for sleep study. Ok to schedule. ----- Message ----- From: , CMA Sent: 01/14/2020   4:10 PM EST To: 01/16/2020 Div Sleep Studies Subject: precert                                         ----- Message ----- From: Loni Muse, CMA Sent: 01/13/2020  11:23 AM EST To: 01/15/2020, CMA  SCHEDULE NPSG STUDY

## 2020-01-21 NOTE — Telephone Encounter (Signed)
Patient is scheduled for lab study on 02/05/20. He is scheduled for COVID screening on 02/03/20 12:40 prior to SS. Patient understands his sleep study will be done at [facility] sleep lab. Patient understands he will receive a sleep packet in a week or so. Patient understands to call if he does not receive the sleep packet in a timely manner.  Left detailed message on voicemail with date and time of titration and informed patient to call back to confirm or reschedule.

## 2020-01-23 ENCOUNTER — Telehealth: Payer: Self-pay

## 2020-01-23 NOTE — Telephone Encounter (Signed)
Called and lmomed the pt stated that we needed lipid labs and to call back to schedule

## 2020-02-03 ENCOUNTER — Other Ambulatory Visit (HOSPITAL_COMMUNITY): Admission: RE | Admit: 2020-02-03 | Payer: BC Managed Care – PPO | Source: Ambulatory Visit

## 2020-02-04 ENCOUNTER — Other Ambulatory Visit: Payer: Self-pay

## 2020-02-04 ENCOUNTER — Encounter (HOSPITAL_COMMUNITY): Payer: Self-pay

## 2020-02-04 ENCOUNTER — Emergency Department (HOSPITAL_COMMUNITY): Payer: BC Managed Care – PPO

## 2020-02-04 ENCOUNTER — Emergency Department (HOSPITAL_COMMUNITY)
Admission: EM | Admit: 2020-02-04 | Discharge: 2020-02-04 | Disposition: A | Discharge status: Home / Self Care | Payer: BC Managed Care – PPO | Attending: Emergency Medicine | Admitting: Emergency Medicine

## 2020-02-04 DIAGNOSIS — E119 Type 2 diabetes mellitus without complications: Secondary | ICD-10-CM | POA: Insufficient documentation

## 2020-02-04 DIAGNOSIS — Z7984 Long term (current) use of oral hypoglycemic drugs: Secondary | ICD-10-CM | POA: Diagnosis not present

## 2020-02-04 DIAGNOSIS — I2510 Atherosclerotic heart disease of native coronary artery without angina pectoris: Secondary | ICD-10-CM | POA: Diagnosis not present

## 2020-02-04 DIAGNOSIS — R079 Chest pain, unspecified: Secondary | ICD-10-CM | POA: Diagnosis present

## 2020-02-04 DIAGNOSIS — R072 Precordial pain: Secondary | ICD-10-CM | POA: Insufficient documentation

## 2020-02-04 DIAGNOSIS — Z87891 Personal history of nicotine dependence: Secondary | ICD-10-CM | POA: Insufficient documentation

## 2020-02-04 DIAGNOSIS — I5042 Chronic combined systolic (congestive) and diastolic (congestive) heart failure: Secondary | ICD-10-CM | POA: Diagnosis not present

## 2020-02-04 DIAGNOSIS — M25512 Pain in left shoulder: Secondary | ICD-10-CM | POA: Diagnosis not present

## 2020-02-04 DIAGNOSIS — Z79899 Other long term (current) drug therapy: Secondary | ICD-10-CM | POA: Insufficient documentation

## 2020-02-04 DIAGNOSIS — Z7982 Long term (current) use of aspirin: Secondary | ICD-10-CM | POA: Insufficient documentation

## 2020-02-04 DIAGNOSIS — I10 Essential (primary) hypertension: Secondary | ICD-10-CM | POA: Insufficient documentation

## 2020-02-04 LAB — CBC
HCT: 42.9 % (ref 39.0–52.0)
Hemoglobin: 13.2 g/dL (ref 13.0–17.0)
MCH: 25 pg — ABNORMAL LOW (ref 26.0–34.0)
MCHC: 30.8 g/dL (ref 30.0–36.0)
MCV: 81.3 fL (ref 80.0–100.0)
Platelets: 222 10*3/uL (ref 150–400)
RBC: 5.28 MIL/uL (ref 4.22–5.81)
RDW: 15.6 % — ABNORMAL HIGH (ref 11.5–15.5)
WBC: 5.1 10*3/uL (ref 4.0–10.5)
nRBC: 0 % (ref 0.0–0.2)

## 2020-02-04 LAB — BASIC METABOLIC PANEL
Anion gap: 10 (ref 5–15)
BUN: 16 mg/dL (ref 6–20)
CO2: 25 mmol/L (ref 22–32)
Calcium: 9.2 mg/dL (ref 8.9–10.3)
Chloride: 106 mmol/L (ref 98–111)
Creatinine, Ser: 1 mg/dL (ref 0.61–1.24)
GFR calc Af Amer: >60 mL/min (ref >60)
GFR calc non Af Amer: >60 mL/min (ref >60)
Glucose, Bld: 165 mg/dL — ABNORMAL HIGH (ref 70–99)
Potassium: 3.8 mmol/L (ref 3.5–5.1)
Sodium: 141 mmol/L (ref 135–145)

## 2020-02-04 LAB — TROPONIN I (HIGH SENSITIVITY)
Troponin I (High Sensitivity): 4 ng/L (ref <18)
Troponin I (High Sensitivity): 5 ng/L (ref <18)

## 2020-02-04 LAB — D-DIMER, QUANTITATIVE: D-Dimer, Quant: <0.27 ug/mL-FEU (ref 0.00–0.50)

## 2020-02-04 NOTE — Discharge Instructions (Signed)
Please read and follow all provided instructions.  Your diagnoses today include:  1. Acute pain of left shoulder   2. Precordial pain     Tests performed today include:  An EKG of your heart  A chest x-ray - no pneumonia or other problems  Shoulder x-ray -show some arthritis changes in your shoulder  Cardiac enzymes - a blood test for heart muscle damage, both are normal  Blood counts and electrolytes  Screening test for blood clot - was normal  Vital signs. See below for your results today.   Medications prescribed:   None  Take any prescribed medications only as directed.  Follow-up instructions: Please follow-up with your primary care provider as soon as you can for further evaluation of your symptoms.   You may also follow-up with the orthopedic doctor listed if you continue to have pain or problems with your shoulder.  Return instructions:  SEEK IMMEDIATE MEDICAL ATTENTION IF:  You have severe chest pain, especially if the pain is crushing or pressure-like and spreads to the arms, back, neck, or jaw, or if you have sweating, nausea (feeling sick to your stomach), or shortness of breath. THIS IS AN EMERGENCY. Don't wait to see if the pain will go away. Get medical help at once. Call 911 or 0 (operator). DO NOT drive yourself to the hospital.   Your chest pain gets worse and does not go away with rest.   You have an attack of chest pain lasting longer than usual, despite rest and treatment with the medications your caregiver has prescribed.   You wake from sleep with chest pain or shortness of breath.  You feel dizzy or faint.  You have chest pain not typical of your usual pain for which you originally saw your caregiver.   You have any other emergent concerns regarding your health.  Additional Information: Chest pain comes from many different causes. Your caregiver has diagnosed you as having chest pain that is not specific for one problem, but does not require  admission.  You are at low risk for an acute heart condition or other serious illness.   Your vital signs today were: BP 136/84    Pulse 70    Temp 98.6 F (37 C) (Oral)    Resp (!) 23    Ht 5\' 7"  (1.702 m)    Wt 104.3 kg    SpO2 100%    BMI 36.02 kg/m  If your blood pressure (BP) was elevated above 135/85 this visit, please have this repeated by your doctor within one month. --------------

## 2020-02-04 NOTE — ED Triage Notes (Signed)
Pt reports left shoulder pain that radiates to his chest, sob at times. Pt denies n/v. Pt a.o at this time, nad noted

## 2020-02-04 NOTE — ED Provider Notes (Signed)
[facility] EMERGENCY DEPARTMENT Provider Note   CSN: [id number] Arrival date & time: 02/04/20  1355     History Chief Complaint  Patient presents with  . Chest Pain  . Shortness of Breath  . Shoulder Pain    Thomas Yoder is a 59 y.o. male.  Patient with history of chronic combined heart failure, on HCTZ, nonobstructive coronary artery disease noted on cardiac CT, diabetes, hyperlipidemia, hypertension --presents to the emergency department today with complaint of left shoulder pain that started yesterday.  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  The pain moved to his chest today, prompting ED visit.  Patient can make the pain worse by raising and moving his left upper extremity and earlier today this actually caused numbness in his left hand.  He denies diaphoresis or vomiting with the pain.  Pain is nonexertional.  No abdominal pains, weakness, numbness or tingling in the lower extremities.  No treatments prior to arrival.  The onset of this condition was acute. The course is constant. Aggravating factors: none. Alleviating factors: none.          Past Medical History:  Diagnosis Date  . CAD (coronary artery disease)    a. Cardiac CT showed calcium score of 83rd percentile, mild stenosis in the proximal LCx and the proximal and mid RCA, negative FFR for hemodynamic significance.  . Chest pain   . Chronic combined systolic and diastolic CHF (congestive heart failure) ([place]) 12/11/2018  . Depression   . Diabetes mellitus without complication ([place])   . Former tobacco use   . H/O noncompliance with medical treatment, presenting hazards to health   . Hyperlipidemia   . Hypertension   . NICM (nonischemic cardiomyopathy) ([place])    a. EF 40-45% by echo 11/2018.  . Obesity (BMI 30-39.9)   . OSA (obstructive sleep apnea) 07/24/2019  . Snoring     Patient Active Problem List   Diagnosis Date Noted  . OSA (obstructive sleep apnea)  07/24/2019  . Chronic combined systolic and diastolic CHF (congestive heart failure) ([place]) 12/11/2018  . Obesity (BMI 30-39.9)   . Chest pain 11/26/2018  . Snoring 11/26/2018  . DM (diabetes mellitus), type 2 ([place]) 11/26/2018  . Primary osteoarthritis of left knee 11/01/2016  . Essential hypertension, benign 08/21/2012  . BMI 34.0-34.9,adult 08/21/2012  . Knee osteoarthritis 08/21/2012    Past Surgical History:  Procedure Laterality Date  . JOINT REPLACEMENT    . KNEE SURGERY    . TOTAL KNEE ARTHROPLASTY Left 11/01/2016   Procedure: TOTAL KNEE ARTHROPLASTY;  Surgeon: Melrose Nakayama, MD;  Location: [facility];  Service: Orthopedics;  Laterality: Left;       Family History  Problem Relation Age of Onset  . Hypertension Mother   . Diabetes Mother   . Hypertension Father   . CAD Neg Hx   . Stroke Neg Hx   . Cancer Neg Hx     Social History   Tobacco Use  . Smoking status: Former Smoker    Packs/day: 0.50    Years: 34.00    Pack years: 17.00    Types: Cigarettes  . Smokeless tobacco: Never Used  . Tobacco comment: 2-3 weeks  Substance Use Topics  . Alcohol use: No  . Drug use: Yes    Types: Marijuana    Home Medications Prior to Admission medications   Medication Sig Start Date End Date Taking? Authorizing Provider  amLODipine (NORVASC) 10 MG tablet Take 1  tablet (10 mg total) by mouth daily. 10/10/19   Sueanne Margarita, MD  aspirin EC 81 MG EC tablet Take 1 tablet (81 mg total) by mouth daily. 11/29/18   Black, Lezlie Octave, NP  atorvastatin (LIPITOR) 80 MG tablet Take 1 tablet (80 mg total) by mouth daily. 01/10/20 01/09/21  [name], MD  Blood Pressure Monitoring (ADULT BLOOD PRESSURE CUFF LG) KIT Check blood pressure daily and record readings. 10/05/16   [name], MD  Blood Pressure Monitoring (BLOOD PRESSURE KIT) DEVI 1 Device by Does not apply route daily. 11/02/18   Tenna Delaine D, PA-C  carvedilol (COREG) 12.5 MG tablet Take 1 tablet (12.5 mg total)  by mouth 2 (two) times daily with a meal. 12/23/19   Dunn, Nedra Hai, PA-C  dicyclomine (BENTYL) 20 MG tablet Take 1 tablet (20 mg total) by mouth 3 (three) times daily as needed for spasms. 10/26/18   Dorie Rank, MD  hydrochlorothiazide (HYDRODIURIL) 25 MG tablet TAKE 1 TABLET(25 MG) BY MOUTH DAILY 10/21/19   Maximiano Coss, NP  Hydrocortisone Acetate 1 % OINT Apply 1 application topically 2 (two) times daily. 09/25/19   Maximiano Coss, NP  Icosapent Ethyl 1 g CAPS Take 2 capsules (2 g total) by mouth 2 (two) times daily. 10/21/19   Sueanne Margarita, MD  lisinopril (ZESTRIL) 40 MG tablet TAKE 1 TABLET(40 MG) BY MOUTH DAILY 12/23/19   Delia Chimes A, MD  metFORMIN (GLUCOPHAGE) 500 MG tablet TAKE 1 TABLET(500 MG) BY MOUTH DAILY WITH BREAKFAST 02/25/19   Delia Chimes A, MD  sildenafil (REVATIO) 20 MG tablet Take 1 tablet (20 mg total) by mouth daily as needed. 10/01/19   Maximiano Coss, NP    Allergies    Patient has no known allergies.  Review of Systems   Review of Systems  Constitutional: Negative for diaphoresis and fever.  Eyes: Negative for redness.  Respiratory: Negative for cough and shortness of breath.   Cardiovascular: Positive for chest pain. Negative for palpitations and leg swelling.  Gastrointestinal: Negative for abdominal pain, nausea and vomiting.  Genitourinary: Negative for dysuria.  Musculoskeletal: Positive for arthralgias and back pain. Negative for neck pain.  Skin: Negative for rash.  Neurological: Negative for syncope and light-headedness.  Psychiatric/Behavioral: The patient is not nervous/anxious.     Physical Exam Updated Vital Signs BP 139/85   Pulse 79   Temp 98.6 F (37 C) (Oral)   Resp 16   Ht '5\' 7"'  (1.702 m)   Wt 104.3 kg   SpO2 99%   BMI 36.02 kg/m   Physical Exam Vitals and nursing note reviewed.  Constitutional:      Appearance: He is well-developed. He is not diaphoretic.  HENT:     Head: Normocephalic and atraumatic.     Mouth/Throat:      Mouth: Mucous membranes are not dry.  Eyes:     Conjunctiva/sclera: Conjunctivae normal.  Neck:     Vascular: Normal carotid pulses. No carotid bruit or JVD.     Trachea: Trachea normal. No tracheal deviation.  Cardiovascular:     Rate and Rhythm: Normal rate and regular rhythm.     Pulses: No decreased pulses.          Radial pulses are 2+ on the right side and 2+ on the left side.     Heart sounds: Normal heart sounds, S1 normal and S2 normal. Heart sounds not distant. No murmur.     Comments: No chest wall tenderness at time  of exam, but does feel 'stiff' when he raises L arm.  Pulmonary:     Effort: Pulmonary effort is normal. No respiratory distress.     Breath sounds: Normal breath sounds. No wheezing.  Chest:     Chest wall: No tenderness.  Abdominal:     General: Bowel sounds are normal.     Palpations: Abdomen is soft.     Tenderness: There is no abdominal tenderness. There is no guarding or rebound.  Musculoskeletal:     Right shoulder: No tenderness. Normal range of motion.     Left shoulder: Tenderness (deltoid laterally) present. Normal range of motion.     Left upper arm: Normal. No swelling or edema.     Left elbow: Normal.     Left forearm: Normal.     Left wrist: Normal.     Cervical back: Normal, normal range of motion and neck supple. No muscular tenderness.     Thoracic back: Normal.     Right lower leg: No edema.     Left lower leg: No edema.  Skin:    General: Skin is warm and dry.     Coloration: Skin is not pale.  Neurological:     Mental Status: He is alert.     ED Results / Procedures / Treatments   Labs (all labs ordered are listed, but only abnormal results are displayed) Labs Reviewed  BASIC METABOLIC PANEL - Abnormal; Notable for the following components:      Result Value   Glucose, Bld 165 (*)    All other components within normal limits  CBC - Abnormal; Notable for the following components:   MCH 25.0 (*)    RDW 15.6 (*)    All other  components within normal limits  D-DIMER, QUANTITATIVE (NOT AT [facility])  TROPONIN I (HIGH SENSITIVITY)  TROPONIN I (HIGH SENSITIVITY)    EKG EKG Interpretation  Date/Time:  Tuesday February 04 2020 14:04:13 EST Ventricular Rate:  80 PR Interval:  168 QRS Duration: 74 QT Interval:  360 QTC Calculation: 415 R Axis:   5 Text Interpretation: Normal sinus rhythm Anterior infarct , age undetermined Abnormal ECG Since last tracing ST and T waves slightly abnormal compared with prior Confirmed by Noemi Chapel [phone]) on 02/04/2020 5:47:19 PM   Radiology DG Chest 2 View  Result Date: 02/04/2020 CLINICAL DATA:  Left chest pain radiating into the back today. EXAM: CHEST - 2 VIEW COMPARISON:  PA and lateral chest 11/26/2018. FINDINGS: Lung volumes are low but the lungs are clear. Heart size is normal. No pneumothorax or pleural effusion. IMPRESSION: No acute disease. Electronically Signed   By: Inge Rise M.D.   On: 02/04/2020 14:58    Procedures Procedures (including critical care time)  Medications Ordered in ED Medications - No data to display  ED Course  I have reviewed the triage vital signs and the nursing notes.  Pertinent labs & imaging results that were available during my care of the patient were reviewed by me and considered in my medical decision making (see chart for details).  Patient seen and examined.  EKG reviewed.  On exam, pain is reproducible with movement and positioning and seems musculoskeletal in nature.  However, patient with multiple risk factors for coronary artery disease and will need to ruled out for blood clot and MI.  Discussed with Dr. Sabra Heck who will see.  Vital signs reviewed and are as follows: BP 136/84   Pulse 70   Temp 98.6 F (37  C) (Oral)   Resp (!) 23   Ht '5\' 7"'  (1.702 m)   Wt 104.3 kg   SpO2 100%   BMI 36.02 kg/m   Work-up here has been very reassuring.  Patient with atypical pain for ACS.  D-dimer negative.  Troponin negative x2.   Chest x-ray without pneumonia.  Shoulder x-ray does show some signs of arthritis.  Plan for discharged home with PCP follow-up.  Orthopedic referral given.  Patient encouraged to rest his arm and use Tylenol for pain.  Patient was counseled to return with severe chest pain, especially if the pain is crushing or pressure-like and spreads to the arms, back, neck, or jaw, or if they have sweating, nausea, or shortness of breath with the pain. They were encouraged to call 911 with these symptoms.   They were also told to return if their chest pain gets worse and does not go away with rest, they have an attack of chest pain lasting longer than usual despite rest and treatment with the medications their caregiver has prescribed, if they wake from sleep with chest pain or shortness of breath, if they feel dizzy or faint, if they have chest pain not typical of their usual pain, or if they have any other emergent concerns regarding their health.  The patient verbalized understanding and agreed.      MDM Rules/Calculators/A&P                      Patient with left shoulder pain that moved into his back and chest today.  No pneumothorax on chest x-ray.  No pneumonias.  Troponin negative x2.  EKG is abnormal, with mild changes compared to previous.  Suspect that this is chronic.  D-dimer is negative.  No clinical signs of DVT and patient low risk otherwise for PE.  Pain is reproducible and made worse with movement and certain positions.  It seems that the patient's pain is likely musculoskeletal related to his left shoulder.  X-ray does show some arthritic changes.  At this point, symptoms are atypical enough with a reassuring work-up.  Do not feel that patient requires admission for further chest pain evaluation at this time.  Doubt dissection, aortic aneurysm.  Patient looks well and seems reliable to return if symptoms worsen.   Final Clinical Impression(s) / ED Diagnoses Final diagnoses:  Acute pain of  left shoulder  Precordial pain    Rx / DC Orders ED Discharge Orders    None       Carlisle Cater, PA-C 02/04/20 1846    Noemi Chapel, MD 02/07/20 1316

## 2020-02-04 NOTE — ED Provider Notes (Signed)
Medical screening examination/treatment/procedure(s) were conducted as a shared visit with non-physician practitioner(s) and myself.  I personally evaluated the patient during the encounter.  Clinical Impression:   Final diagnoses:  Acute pain of left shoulder  Precordial pain    This patient is an unremarkable appearing 58 year old male who is having some intermittent pain in the left shoulder seems to radiate into the chest, denies numbness or weakness and has a normal exam with regards to his arm with normal pulses sensation and motor function.  EKG unremarkable, troponin normal, D-dimer normal, patient stable for discharge, agreeable that this is unlikely to be his heart, seem to get worse with range of motion.  He does have arthritis on the x-ray.   Eber Hong, MD 02/07/20 1316

## 2020-02-05 ENCOUNTER — Encounter (HOSPITAL_BASED_OUTPATIENT_CLINIC_OR_DEPARTMENT_OTHER): Payer: BC Managed Care – PPO | Admitting: Cardiology

## 2020-02-17 ENCOUNTER — Telehealth: Payer: Self-pay | Admitting: Family Medicine

## 2020-02-17 ENCOUNTER — Other Ambulatory Visit (HOSPITAL_COMMUNITY): Payer: BC Managed Care – PPO

## 2020-02-17 NOTE — Telephone Encounter (Signed)
Pt walked in office requesting the following medication: Carvedilil 12.5 mg Amlodipine 10 mg Hydrochlorothiazide 25 mg  Pharmacy confirmed as Therapist, occupational on [employer]

## 2020-02-18 ENCOUNTER — Other Ambulatory Visit: Payer: Self-pay

## 2020-02-18 ENCOUNTER — Other Ambulatory Visit (HOSPITAL_COMMUNITY)
Admission: RE | Admit: 2020-02-18 | Discharge: 2020-02-18 | Disposition: A | Discharge status: Home / Self Care | Payer: BC Managed Care – PPO | Source: Ambulatory Visit | Attending: Cardiology | Admitting: Cardiology

## 2020-02-18 DIAGNOSIS — Z20822 Contact with and (suspected) exposure to covid-19: Secondary | ICD-10-CM | POA: Diagnosis not present

## 2020-02-18 DIAGNOSIS — I10 Essential (primary) hypertension: Secondary | ICD-10-CM

## 2020-02-18 DIAGNOSIS — Z01812 Encounter for preprocedural laboratory examination: Secondary | ICD-10-CM | POA: Diagnosis present

## 2020-02-18 LAB — SARS CORONAVIRUS 2 (TAT 6-24 HRS): SARS Coronavirus 2: NEGATIVE

## 2020-02-18 MED ORDER — HYDROCHLOROTHIAZIDE 25 MG PO TABS: ORAL_TABLET | ORAL | 0 refills | Status: DC

## 2020-02-18 MED ORDER — AMLODIPINE BESYLATE 10 MG PO TABS: 10.0000 mg | ORAL_TABLET | Freq: Every day | ORAL | 11 refills | Status: DC

## 2020-02-18 MED ORDER — CARVEDILOL 12.5 MG PO TABS: 12.5000 mg | ORAL_TABLET | Freq: Two times a day (BID) | ORAL | 0 refills | Status: DC

## 2020-02-19 ENCOUNTER — Encounter (HOSPITAL_BASED_OUTPATIENT_CLINIC_OR_DEPARTMENT_OTHER): Payer: BC Managed Care – PPO | Admitting: Cardiology

## 2020-02-20 ENCOUNTER — Other Ambulatory Visit: Payer: Self-pay

## 2020-02-20 ENCOUNTER — Ambulatory Visit (HOSPITAL_BASED_OUTPATIENT_CLINIC_OR_DEPARTMENT_OTHER): Payer: BC Managed Care – PPO | Attending: Cardiology | Admitting: Cardiology

## 2020-02-20 DIAGNOSIS — I493 Ventricular premature depolarization: Secondary | ICD-10-CM | POA: Insufficient documentation

## 2020-02-20 DIAGNOSIS — G4731 Primary central sleep apnea: Secondary | ICD-10-CM | POA: Insufficient documentation

## 2020-02-20 DIAGNOSIS — G4733 Obstructive sleep apnea (adult) (pediatric): Secondary | ICD-10-CM

## 2020-02-20 DIAGNOSIS — R0683 Snoring: Secondary | ICD-10-CM | POA: Diagnosis not present

## 2020-02-20 DIAGNOSIS — R0902 Hypoxemia: Secondary | ICD-10-CM | POA: Insufficient documentation

## 2020-02-21 ENCOUNTER — Telehealth: Payer: Self-pay | Admitting: Cardiology

## 2020-02-21 NOTE — Telephone Encounter (Signed)
New Message  Hailey from [facility] sleep lab called and would like Dr. Mayford Knife know that they couldn't finished the test today due to NPSG of the pt is very severe. She said if Dr. Mayford Knife needs the diagnosis today it will be severe sleep apnea and full report will be submitted on Monday.

## 2020-02-28 NOTE — Procedures (Signed)
    Patient Name: Thomas Yoder, Thomas Yoder Date:02/20/2020 Gender: Male D.O.B: 21-Dec-1960 Age (years): 48 Referring Provider: Armanda Magic MD, ABSM Height (inches): 67 Interpreting Physician: Armanda Magic MD, ABSM Weight (lbs): 230 RPSGT: Cherylann Parr BMI: 36 MRN: [id number] Neck Size: 18.00  CLINICAL INFORMATION Sleep Study Type: NPSG  Indication for sleep study: Diabetes, Fatigue, Obesity, Snoring, Witnesses Apnea / Gasping During Sleep  Epworth Sleepiness Score: 6  Most recent polysomnogram dated 01/03/2019 revealed an AHI of 78/h and RDI of 78/h. Most recent titration study dated 07/24/2019 was optimal at 266cm H2O with an AHI of 72/h.  SLEEP STUDY TECHNIQUE As per the AASM Manual for the Scoring of Sleep and Associated Events v2.3 (April 2016) with a hypopnea requiring 4% desaturations.  The channels recorded and monitored were frontal, central and occipital EEG, electrooculogram (EOG), submentalis EMG (chin), nasal and oral airflow, thoracic and abdominal wall motion, anterior tibialis EMG, snore microphone, electrocardiogram, and pulse oximetry.  MEDICATIONS Medications self-administered by patient taken the night of the study : N/A  SLEEP ARCHITECTURE The study was initiated at 10:10:52 PM and ended at 4:33:47 AM.  Sleep onset time was 4.8 minutes and the sleep efficiency was 87.7%. The total sleep time was 336 minutes.  Stage REM latency was 164.5 minutes.  The patient spent 4.9% of the night in stage N1 sleep, 83.5% in stage N2 sleep, 0.0% in stage N3 and 11.6% in REM.  Alpha intrusion was absent.  Supine sleep was 41.37%.  RESPIRATORY PARAMETERS The overall apnea/hypopnea index (AHI) was 88.6 per hour. There were 476 total apneas, including 281 obstructive, 161 central and 34 mixed apneas. There were 20 hypopneas and 0 RERAs.  The AHI during Stage REM sleep was 61.5 per hour.  AHI while supine was 93.7 per hour.  The mean oxygen saturation was 87.7%. The  minimum SpO2 during sleep was 68.0%.  loud snoring was noted during this study.  CARDIAC DATA The 2 lead EKG demonstrated sinus rhythm. The mean heart rate was 65.9 beats per minute. Other EKG findings include: PVCs.  LEG MOVEMENT DATA The total PLMS were 0 with a resulting PLMS index of 0.0. Associated arousal with leg movement index was 0.0 .  IMPRESSIONS - Severe obstructive sleep apnea occurred during this study (AHI = 88.6/h). - Moderate central sleep apnea occurred during this study (CAI = 28.8/h). - Severe oxygen desaturation was noted during this study (Min O2 = 68.0%). - The patient snored with loud snoring volume. - EKG findings include PVCs. - Clinically significant periodic limb movements did not occur during sleep. No significant associated arousals.  DIAGNOSIS - Obstructive Sleep Apnea (327.23 [G47.33 ICD-10]) - Central Sleep Apnea (327.27 [G47.37 ICD-10]) - Nocturnal Hypoxemia (327.26 [G47.36 ICD-10])  RECOMMENDATIONS - CPAP titration to determine optimal pressure required to alleviate sleep disordered breathing. BiPAP or ASV titration may be required to eliminate central sleep apnea. - Avoid alcohol, sedatives and other CNS depressants that may worsen sleep apnea and disrupt normal sleep architecture. - Sleep hygiene should be reviewed to assess factors that may improve sleep quality. - Weight management and regular exercise should be initiated or continued if appropriate.  [Electronically signed] 02/28/2020 11:03 AM  Armanda Magic MD, ABSM Diplomate, American Board of Sleep Medicine

## 2020-03-03 ENCOUNTER — Telehealth: Payer: Self-pay | Admitting: *Deleted

## 2020-03-03 DIAGNOSIS — G4733 Obstructive sleep apnea (adult) (pediatric): Secondary | ICD-10-CM

## 2020-03-03 NOTE — Telephone Encounter (Signed)
Informed patient of sleep study results and patient understanding was verbalized. Patient understands his sleep study showed they have severe sleep apnea and recommend CPAP titration. Please set up titration in the sleep lab ASAP due to severity of OSA . Titration sent to precert

## 2020-03-03 NOTE — Telephone Encounter (Signed)
-----   Message from Quintella Reichert, MD sent at 02/28/2020 11:06 AM EST ----- Please let patient know that they have severe sleep apnea and recommend CPAP titration. Please set up titration in the sleep lab ASAP due to severity of OSA

## 2020-03-06 ENCOUNTER — Telehealth: Payer: Self-pay | Admitting: *Deleted

## 2020-03-06 NOTE — Telephone Encounter (Signed)
Staff message sent to [name] per Cheney F @ [employer] no PA is required for sleep study. Ok to schedule.

## 2020-03-17 ENCOUNTER — Encounter: Payer: Self-pay | Admitting: *Deleted

## 2020-03-17 NOTE — Telephone Encounter (Signed)
Patient is scheduled for CPAP Titration on 04/05/20. Pt is scheduled for COVID screening on 04/03/20 11:45 prior to titration.  Patient understands his titration study will be done at [facility] sleep lab. Patient understands he will receive a letter in a week or so detailing appointment, date, time, and location. Patient understands to call if he does not receive the letter  in a timely manner. Patient agrees with treatment and thanked me for call.

## 2020-03-17 NOTE — Progress Notes (Signed)
Patient is scheduled for CPAP Titration on 04/05/20. Pt is scheduled for COVID screening on 04/03/20 prior to titration.  Patient understands his titration study will be done at [facility] sleep lab. Patient understands he will receive a letter in a week or so detailing appointment, date, time, and location. Patient understands to call if he does not receive the letter  in a timely manner. Patient agrees with treatment and thanked me for call.

## 2020-03-17 NOTE — Telephone Encounter (Signed)
RE: precert Gaynelle Cage, CMA  Reesa Chew, CMA  Ok to schedule. Per Marena Chancy F @ [employer] no PA is required.        ----- Message -----  From: Reesa Chew, CMA  Sent: 03/03/2020  1:49 PM EDT  To: [facility]  Subject: precert                      recommend CPAP titration

## 2020-04-03 ENCOUNTER — Other Ambulatory Visit (HOSPITAL_COMMUNITY)
Admission: RE | Admit: 2020-04-03 | Discharge: 2020-04-03 | Disposition: A | Discharge status: Home / Self Care | Payer: BC Managed Care – PPO | Source: Ambulatory Visit | Attending: Cardiology | Admitting: Cardiology

## 2020-04-03 DIAGNOSIS — Z20822 Contact with and (suspected) exposure to covid-19: Secondary | ICD-10-CM | POA: Insufficient documentation

## 2020-04-03 DIAGNOSIS — Z01812 Encounter for preprocedural laboratory examination: Secondary | ICD-10-CM | POA: Diagnosis not present

## 2020-04-03 LAB — SARS CORONAVIRUS 2 (TAT 6-24 HRS): SARS Coronavirus 2: NEGATIVE

## 2020-04-05 ENCOUNTER — Ambulatory Visit (HOSPITAL_BASED_OUTPATIENT_CLINIC_OR_DEPARTMENT_OTHER): Payer: BC Managed Care – PPO | Attending: Cardiology | Admitting: Cardiology

## 2020-04-05 ENCOUNTER — Other Ambulatory Visit: Payer: Self-pay

## 2020-04-05 DIAGNOSIS — G4733 Obstructive sleep apnea (adult) (pediatric): Secondary | ICD-10-CM | POA: Diagnosis present

## 2020-04-22 NOTE — Procedures (Signed)
   Patient Name: Thomas Yoder, Sigl Date: 04/05/2020 Gender: Male D.O.B: 1961/01/19 Age (years): 43 Referring Provider: Armanda Magic MD, ABSM Height (inches): 67 Interpreting Physician: Armanda Magic MD, ABSM Weight (lbs): 230 RPSGT: Cherylann Parr BMI: 36 MRN: [id number] Neck Size: 18.00  CLINICAL INFORMATION The patient is referred for a CPAP titration to treat sleep apnea.  SLEEP STUDY TECHNIQUE As per the AASM Manual for the Scoring of Sleep and Associated Events v2.3 (April 2016) with a hypopnea requiring 4% desaturations.  The channels recorded and monitored were frontal, central and occipital EEG, electrooculogram (EOG), submentalis EMG (chin), nasal and oral airflow, thoracic and abdominal wall motion, anterior tibialis EMG, snore microphone, electrocardiogram, and pulse oximetry. Continuous positive airway pressure (CPAP) was initiated at the beginning of the study and titrated to treat sleep-disordered breathing.  MEDICATIONS Medications self-administered by patient taken the night of the study : N/A  TECHNICIAN COMMENTS Comments added by technician: Sabino Dick FULL FACE MASK Comments added by scorer: N/A  RESPIRATORY PARAMETERS Optimal PAP Pressure (cm): 17  AHI at Optimal Pressure (/hr):1.0 Overall Minimal O2 (%):71.0  Supine % at Optimal Pressure (%): 0 Minimal O2 at Optimal Pressure (%): 93.0   SLEEP ARCHITECTURE The study was initiated at 10:32:48 PM and ended at 4:36:36 AM.  Sleep onset time was 0.1 minutes and the sleep efficiency was 81.2%. The total sleep time was 295.5 minutes.  The patient spent 2.2% of the night in stage N1 sleep, 76.8% in stage N2 sleep, 0.0% in stage N3 and 21% in REM.Stage REM latency was 181.0 minutes  Wake after sleep onset was 68.2. Alpha intrusion was absent. Supine sleep was 66.33%.  CARDIAC DATA The 2 lead EKG demonstrated sinus rhythm. The mean heart rate was 59.2 beats per minute. Other EKG findings include: None.  LEG  MOVEMENT DATA The total Periodic Limb Movements of Sleep (PLMS) were 0. The PLMS index was 0.0. A PLMS index of <15 is considered normal in adults.  IMPRESSIONS - The optimal PAP pressure was 17 cm of water. - Central sleep apnea was not noted during this titration (CAI = 0.2/h). - Severe oxygen desaturations were observed during this titration (min O2 = 71.0%). - No snoring was audible during this study. - No cardiac abnormalities were observed during this study. - Clinically significant periodic limb movements were not noted during this study. Arousals associated with PLMs were rare.  DIAGNOSIS - Obstructive Sleep Apnea (327.23 [G47.33 ICD-10])  RECOMMENDATIONS - Trial of CPAP therapy on 17 cm H2O with a Medium size Fisher&Paykel Full Face Mask F&P Vitera (new) mask and heated humidification. - Avoid alcohol, sedatives and other CNS depressants that may worsen sleep apnea and disrupt normal sleep architecture. - Sleep hygiene should be reviewed to assess factors that may improve sleep quality. - Weight management and regular exercise should be initiated or continued. - Return to Sleep Center for re-evaluation after 8 weeks of therapy  [Electronically signed] 04/22/2020 03:20 PM  Armanda Magic MD, ABSM Diplomate, American Board of Sleep Medicine

## 2020-04-24 ENCOUNTER — Telehealth: Payer: Self-pay | Admitting: *Deleted

## 2020-04-24 NOTE — Telephone Encounter (Signed)
Informed patient of sleep study results and patient understanding was verbalized. Patient understands his sleep study showed they had a successful PAP titration and let DME know that orders are in EPIC. Please set up 10 week OV with me.   Upon patient request DME selection is [facility]. Patient understands he will be contacted by [facility] to set up his cpap. Patient understands to call if [facility] does not contact him with new setup in a timely manner. Patient understands they will be called once confirmation has been received from [facility] that they have received their new machine to schedule 10 week follow up appointment.  [facility] notified of new cpap order  Please add to airview Patient was grateful for the call and thanked me.    

## 2020-04-24 NOTE — Telephone Encounter (Signed)
-----   Message from Quintella Reichert, MD sent at 04/22/2020  3:22 PM EDT ----- Please let patient know that they had a successful PAP titration and let DME know that orders are in EPIC.  Please set up 10 week OV with me.

## 2020-05-20 ENCOUNTER — Other Ambulatory Visit: Payer: Self-pay

## 2020-05-20 DIAGNOSIS — I10 Essential (primary) hypertension: Secondary | ICD-10-CM

## 2020-05-20 MED ORDER — LISINOPRIL 40 MG PO TABS: ORAL_TABLET | ORAL | 0 refills | Status: DC

## 2020-05-29 ENCOUNTER — Other Ambulatory Visit: Payer: Self-pay

## 2020-05-29 DIAGNOSIS — I10 Essential (primary) hypertension: Secondary | ICD-10-CM

## 2020-05-29 MED ORDER — HYDROCHLOROTHIAZIDE 25 MG PO TABS: ORAL_TABLET | ORAL | 0 refills | Status: DC

## 2020-05-29 MED ORDER — CARVEDILOL 12.5 MG PO TABS: 12.5000 mg | ORAL_TABLET | Freq: Two times a day (BID) | ORAL | 0 refills | Status: DC

## 2020-06-08 IMAGING — DX DG ABDOMEN ACUTE W/ 1V CHEST
3 series · 3 of 3 positions shown · non-contrast
Comparison: Chest radiographs 03/14/2018 and earlier. Lumbar
radiographs 05/14/2008.

CLINICAL DATA: 57-year-old male with right abdominal pain for 1
week, progressive.

EXAM:
DG ABDOMEN ACUTE W/ 1V CHEST

[w chest pa]
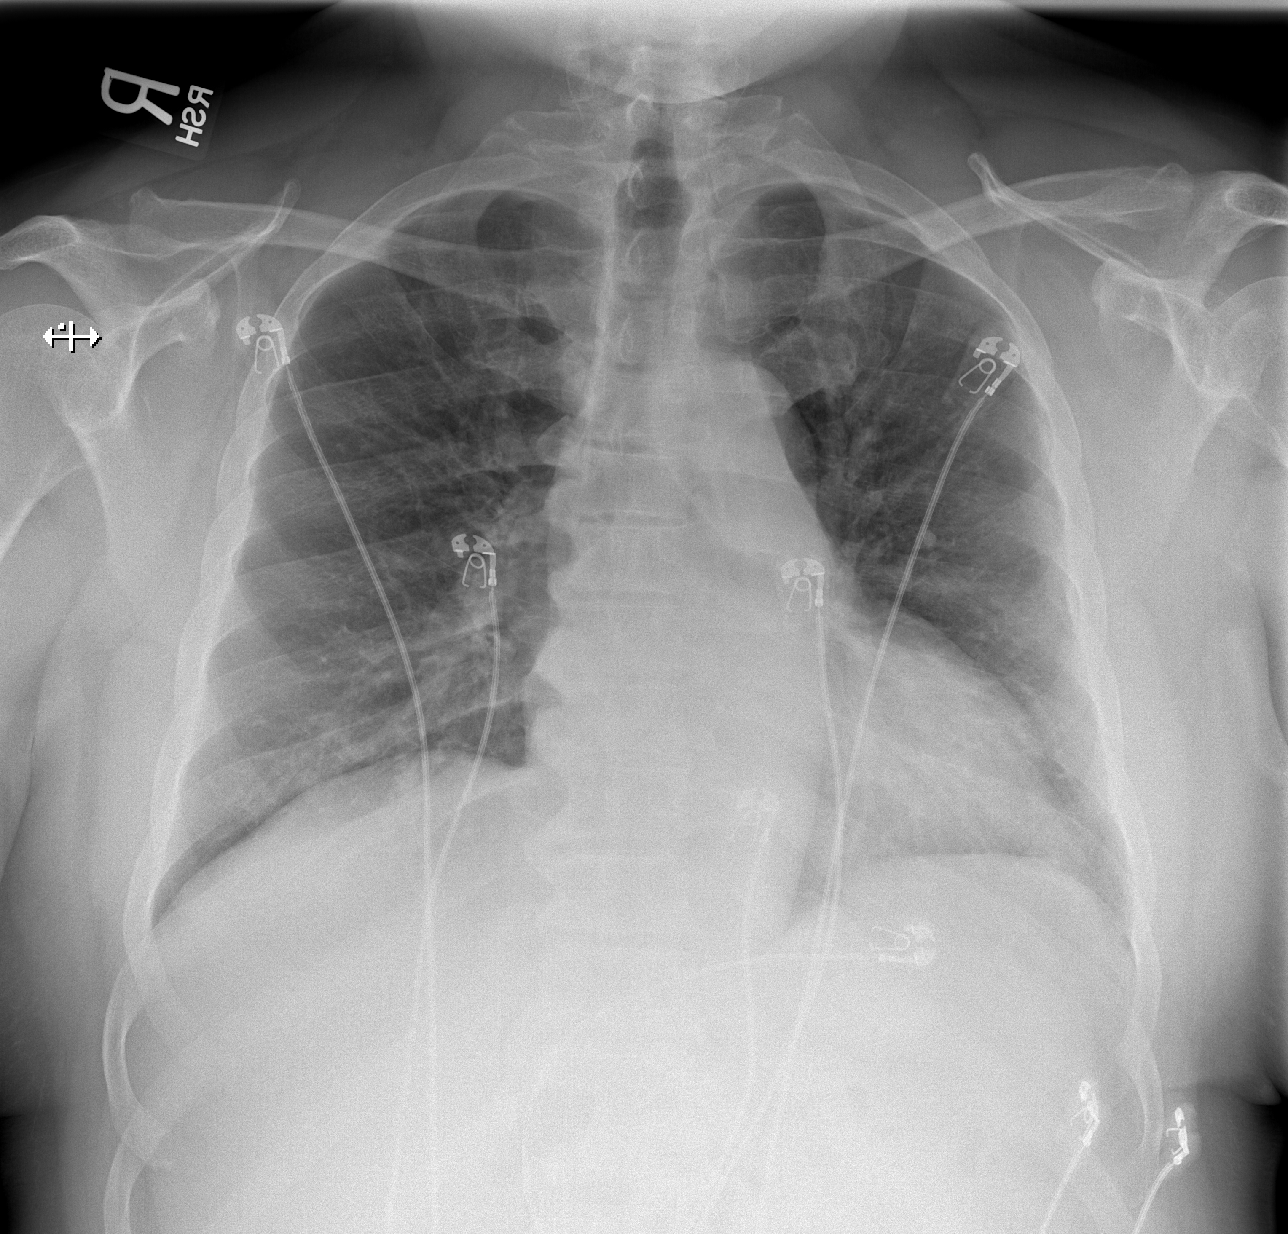

[w abdomen upright]
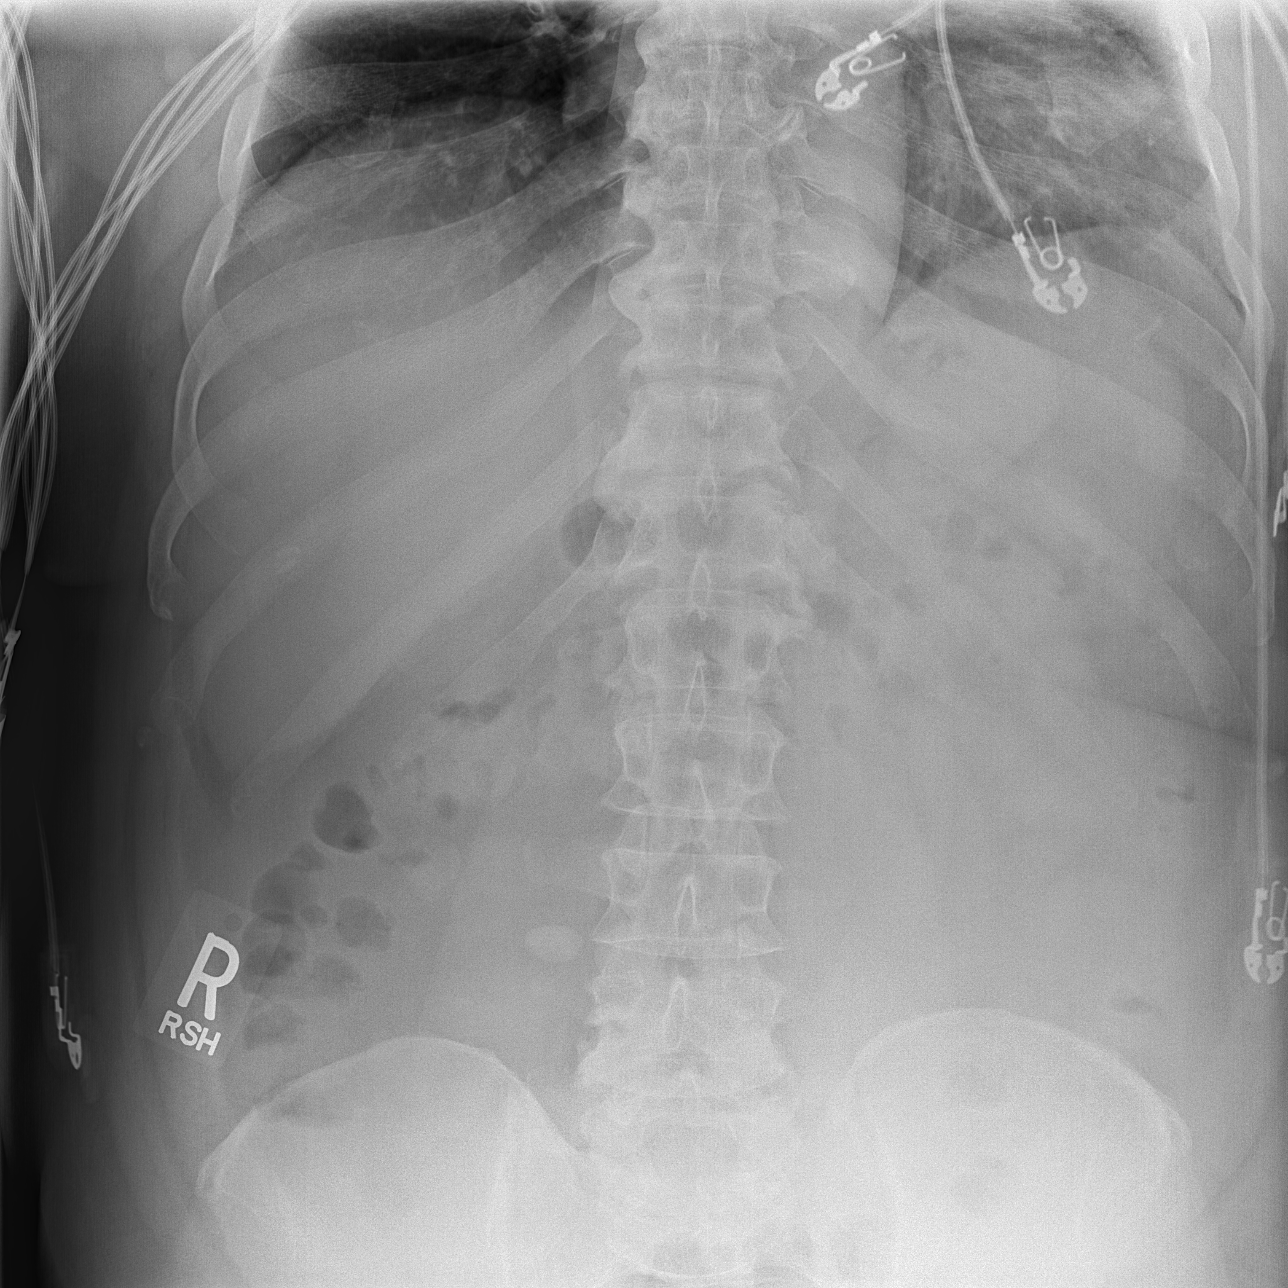

[t abdomen supine]
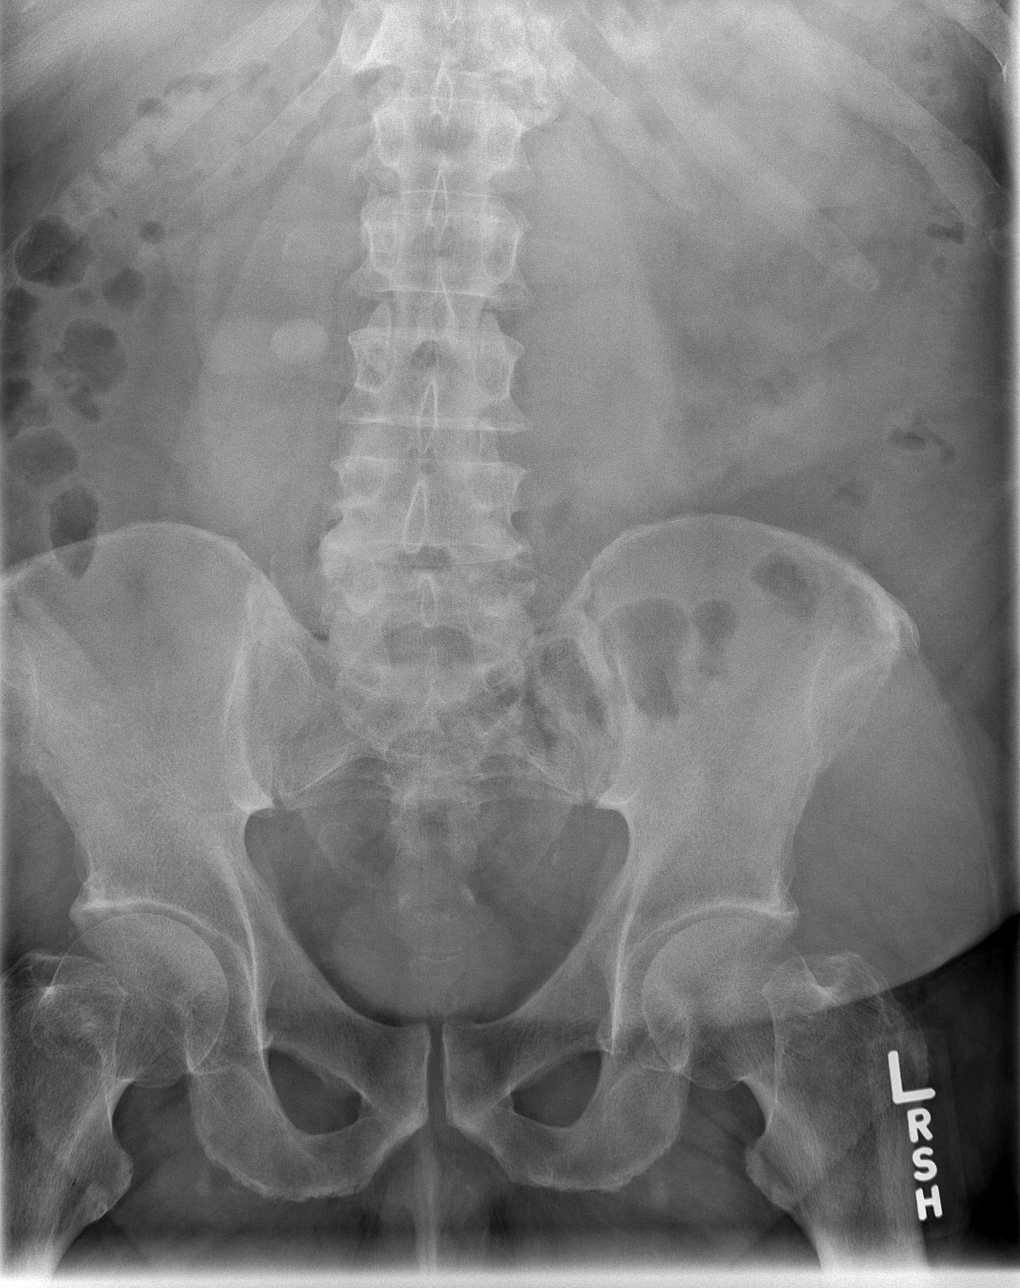

[3 of 3 positions shown; findings below may reference images not displayed]

FINDINGS: Stable lung volumes and mediastinal contours. No pneumothorax or
pneumoperitoneum. Stable lung markings. No acute pulmonary opacity.
Visualized tracheal air column is within normal limits.

Large round 20 millimeter calcified object to the right of the L3
vertebra projecting over the right transverse process is new since
7338. This partially projects over the right renal contour and may
be within the renal pelvis.

No other suspicious calcification; left hemipelvis phlebolith
suspected.

Superimposed Non obstructed bowel gas pattern. Other abdominal and
pelvic visceral contours are normal. No acute osseous abnormality
identified.
IMPRESSION: 1. Large 2 cm right renal calculus suspected. Recommend CT Abdomen
and Pelvis, noncontrast should suffice.
2. Non obstructed bowel gas pattern, no free air.
3. No acute cardiopulmonary abnormality.

## 2020-08-12 DIAGNOSIS — R7303 Prediabetes: Secondary | ICD-10-CM | POA: Insufficient documentation

## 2020-08-12 DIAGNOSIS — N529 Male erectile dysfunction, unspecified: Secondary | ICD-10-CM | POA: Insufficient documentation

## 2020-08-19 ENCOUNTER — Telehealth: Payer: Self-pay | Admitting: *Deleted

## 2020-08-19 NOTE — Telephone Encounter (Signed)
  Patient Consent for Virtual Visit         [name] has provided verbal consent on 08/19/2020 for a virtual visit (video or telephone).   CONSENT FOR VIRTUAL VISIT FOR:  Thomas Yoder  By participating in this virtual visit I agree to the following:  I hereby voluntarily request, consent and authorize [facility] and its employed or contracted physicians, physician assistants, nurse practitioners or other licensed health care professionals (the Practitioner), to provide me with telemedicine health care services (the "Services") as deemed necessary by the treating Practitioner. I acknowledge and consent to receive the Services by the Practitioner via telemedicine. I understand that the telemedicine visit will involve communicating with the Practitioner through live audiovisual communication technology and the disclosure of certain medical information by electronic transmission. I acknowledge that I have been given the opportunity to request an in-person assessment or other available alternative prior to the telemedicine visit and am voluntarily participating in the telemedicine visit.  I understand that I have the right to withhold or withdraw my consent to the use of telemedicine in the course of my care at any time, without affecting my right to future care or treatment, and that the Practitioner or I may terminate the telemedicine visit at any time. I understand that I have the right to inspect all information obtained and/or recorded in the course of the telemedicine visit and may receive copies of available information for a reasonable fee.  I understand that some of the potential risks of receiving the Services via telemedicine include:  Marland Kitchen Delay or interruption in medical evaluation due to technological equipment failure or disruption; . Information transmitted may not be sufficient (e.g. poor resolution of images) to allow for appropriate medical decision making by the Practitioner;  and/or  . In rare instances, security protocols could fail, causing a breach of personal health information.  Furthermore, I acknowledge that it is my responsibility to provide information about my medical history, conditions and care that is complete and accurate to the best of my ability. I acknowledge that Practitioner's advice, recommendations, and/or decision may be based on factors not within their control, such as incomplete or inaccurate data provided by me or distortions of diagnostic images or specimens that may result from electronic transmissions. I understand that the practice of medicine is not an exact science and that Practitioner makes no warranties or guarantees regarding treatment outcomes. I acknowledge that a copy of this consent can be made available to me via my patient portal [facility] MyChart), or I can request a printed copy by calling the office of [facility].    I understand that my insurance will be billed for this visit.   I have read or had this consent read to me. . I understand the contents of this consent, which adequately explains the benefits and risks of the Services being provided via telemedicine.  . I have been provided ample opportunity to ask questions regarding this consent and the Services and have had my questions answered to my satisfaction. . I give my informed consent for the services to be provided through the use of telemedicine in my medical care

## 2020-08-19 NOTE — Telephone Encounter (Signed)
Patient has a 10 week follow up appointment scheduled for 08/26/20. Patient understands he needs to keep this appointment for insurance compliance. Patient was grateful for the call and thanked me.  

## 2020-08-25 NOTE — Progress Notes (Signed)
Virtual Visit via Video Note   This visit type was conducted due to national recommendations for restrictions regarding the COVID-19 Pandemic (e.g. social distancing) in an effort to limit this patient's exposure and mitigate transmission in our community.  Due to his co-morbid illnesses, this patient is at least at moderate risk for complications without adequate follow up.  This format is felt to be most appropriate for this patient at this time.  All issues noted in this document were discussed and addressed.  A limited physical exam was performed with this format.  Please refer to the patient's chart for his consent to telehealth for [facility].   Evaluation Performed:  Follow-up visit  This visit type was conducted due to national recommendations for restrictions regarding the COVID-19 Pandemic (e.g. social distancing).  This format is felt to be most appropriate for this patient at this time.  All issues noted in this document were discussed and addressed.  No physical exam was performed (except for noted visual exam findings with Video Visits).  Please refer to the patient's chart (MyChart message for video visits and phone note for telephone visits) for the patient's consent to telehealth for [facility]  Date:  08/26/2020   ID:  [name], DOB 11-22-1961, MRN [id number]  Patient Location:  Home  Provider location:   [place]  PCP:  [name], MD  Cardiologist:  Fransico Him, MD  Electrophysiologist:  None   Chief Complaint:  OSA, HTN  History of Present Illness:    Bethel Gaglio is a 59 y.o. male who presents via audio/video conferencing for a telehealth visit today.    Leonides Cave a 59 y.o.malewith history ofDM, HTN, intermittent noncompliance, tobacco abuse,snoring, NICM, nonobstructive CAD by CT 11/2018.   He underwent sleep study showing severe OSA with an AHi of 78/hr and subsequent underwent PAP titration and is now on BiPAP at 22/18cm H2O. He  did not tolerate the set BiPAP pressure as it was too high and he was switched to auto BiPAP.   When I saw him back he had unfortunately been noncompliant with his device and was unable to get new supplies.  He had been having problems with his mask leaking.  His insurance made him return his device for noncompliance.    He ultimately had to repeat a sleep study and showed severe OSA with an [facility] of 88/hr with moderate central sleep apnea with a CAI of 28.8/hr and severe oxygen desaturations to 68%.  He underwent CPAP titration to 17cm H2O in April 2021.    He is doing well with his CPAP device and thinks that he has gotten used to it.  He tolerates the mask and feels the pressure is adequate.  Since going on CPAP he feels rested in the am and has no significant daytime sleepiness.  He denies any significant mouth or nasal dryness or nasal congestion.  He does not think that he snores.  He tells me that he gets home and he is very tired and goes to bed and forgets to put it on.  Prior CV studies:   The following studies were reviewed today:  PAP compliance download, sleep study and CPAP titration  Past Medical History:  Diagnosis Date  . CAD (coronary artery disease)    a. Cardiac CT showed calcium score of 83rd percentile, mild stenosis in the proximal LCx and the proximal and mid RCA, negative FFR for hemodynamic significance.  . Chest pain   . Chronic combined  systolic and diastolic CHF (congestive heart failure) ([place]) 12/11/2018  . Depression   . Diabetes mellitus without complication ([place])   . Former tobacco use   . H/O noncompliance with medical treatment, presenting hazards to health   . Hyperlipidemia   . Hypertension   . NICM (nonischemic cardiomyopathy) ([place])    a. EF 40-45% by echo 11/2018.  . Obesity (BMI 30-39.9)   . OSA (obstructive sleep apnea) 07/24/2019  . Snoring    Past Surgical History:  Procedure Laterality Date  . JOINT REPLACEMENT    . KNEE SURGERY    . 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 amLODipine (NORVASC) 10 MG tablet Take 1 tablet (10 mg total) by mouth daily.  Marland Kitchen aspirin EC 81 MG EC tablet Take 1 tablet (81 mg total) by mouth daily.  Marland Kitchen atorvastatin (LIPITOR) 80 MG tablet Take 1 tablet (80 mg total) by mouth daily.  . Blood Pressure Monitoring (ADULT BLOOD PRESSURE CUFF LG) KIT Check blood pressure daily and record readings.  . Blood Pressure Monitoring (BLOOD PRESSURE KIT) DEVI 1 Device by Does not apply route daily.  . carvedilol (COREG) 12.5 MG tablet Take 1 tablet (12.5 mg total) by mouth 2 (two) times daily with a meal.  . dicyclomine (BENTYL) 20 MG tablet Take 1 tablet (20 mg total) by mouth 3 (three) times daily as needed for spasms.  . hydrochlorothiazide (HYDRODIURIL) 25 MG tablet TAKE 1 TABLET(25 MG) BY MOUTH DAILY  . lisinopril (ZESTRIL) 40 MG tablet TAKE 1 TABLET(40 MG) BY MOUTH DAILY  . metFORMIN (GLUCOPHAGE) 500 MG tablet TAKE 1 TABLET(500 MG) BY MOUTH DAILY WITH BREAKFAST  . sildenafil (REVATIO) 20 MG tablet Take 1 tablet (20 mg total) by mouth daily as needed.     Allergies:   Patient has no known allergies.   Social History   Tobacco Use  . Smoking status: Former Smoker    Packs/day: 0.50    Years: 34.00    Pack years: 17.00    Types: Cigarettes  . Smokeless tobacco: Never Used  . Tobacco comment: 2-3 weeks  Vaping Use  . Vaping Use: Never used  Substance Use Topics  . Alcohol use: No  . Drug use: Yes    Types: Marijuana     Family Hx: The patient's family history includes Diabetes in his mother; Hypertension in his father and mother. There is no history of CAD, Stroke, or Cancer.  ROS:   Please see the history of present illness.     All other systems reviewed and are negative.   Labs/Other Tests and Data Reviewed:    Recent  Labs: 10/17/2019: ALT 13 02/04/2020: BUN 16; Creatinine, Ser 1.00; Hemoglobin 13.2; Platelets 222; Potassium 3.8; Sodium 141   Recent Lipid Panel Lab Results  Component Value Date/Time   CHOL 122 10/17/2019 10:53 AM   TRIG 194 (H) 10/17/2019 10:53 AM   HDL 36 (L) 10/17/2019 10:53 AM   CHOLHDL 3.4 10/17/2019 10:53 AM   CHOLHDL 8.2 11/27/2018 12:28 AM   LDLCALC 54 10/17/2019 10:53 AM    Wt Readings from Last 3 Encounters:  08/26/20 233 lb (105.7 kg)  04/05/20 230 lb (104.3 kg)  02/20/20 230 lb (104.3 kg)     Objective:    Vital Signs:  BP 123/85   Ht '5\' 7"'  (1.702 m)  Wt 233 lb (105.7 kg)   BMI 36.49 kg/m     ASSESSMENT & PLAN:    1.  OSA -The patient is tolerating PAP therapy well without any problems. The PAP download was reviewed today and showed an AHI of 5.2/hr on 17 cm H2O with 3% compliance in using more than 4 hours nightly.  The patient has been using and benefiting from PAP use and will continue to benefit from therapy.  -I have encouraged him to be more compliant with his device  2.  HTN -BP controlled -continue Carvedilol 12.15m BID, HCTZ 231mdaily and Lisinopril 4070maily  3.  Obesity -I have encouraged him to get into a routine exercise program and cut back on carbs and portions.   COVID-19 Education: The signs and symptoms of COVID-19 were discussed with the patient and how to seek care for testing (follow up with PCP or arrange E-visit).  The importance of social distancing was discussed today.  Patient Risk:   After full review of this patient's clinical status, I feel that they are at least moderate risk at this time.  Time:   Today, I have spent 20 minutes on telemedicine discussing medical problems including OSA, HTN, obesity and reviewing patient's chart including PAP compliance download, sleep study and CPAP titration.  Medication Adjustments/Labs and Tests Ordered: Current medicines are reviewed at length with the patient today.  Concerns  regarding medicines are outlined above.  Tests Ordered: No orders of the defined types were placed in this encounter.  Medication Changes: No orders of the defined types were placed in this encounter.   Disposition:  Follow up 1 year  Signed, TraFransico HimD  08/26/2020 8:15 AM    [place]

## 2020-08-26 ENCOUNTER — Other Ambulatory Visit: Payer: Self-pay

## 2020-08-26 ENCOUNTER — Encounter: Payer: Self-pay | Admitting: Cardiology

## 2020-08-26 ENCOUNTER — Telehealth (INDEPENDENT_AMBULATORY_CARE_PROVIDER_SITE_OTHER): Payer: BC Managed Care – PPO | Admitting: Cardiology

## 2020-08-26 VITALS — BP 123/85 | Ht 67.0 in | Wt 233.0 lb

## 2020-08-26 DIAGNOSIS — E669 Obesity, unspecified: Secondary | ICD-10-CM

## 2020-08-26 DIAGNOSIS — G4733 Obstructive sleep apnea (adult) (pediatric): Secondary | ICD-10-CM

## 2020-08-26 DIAGNOSIS — I10 Essential (primary) hypertension: Secondary | ICD-10-CM | POA: Diagnosis not present

## 2020-11-11 DIAGNOSIS — U071 COVID-19: Secondary | ICD-10-CM | POA: Insufficient documentation

## 2021-01-18 ENCOUNTER — Other Ambulatory Visit: Payer: Self-pay | Admitting: Registered Nurse

## 2021-01-18 DIAGNOSIS — I10 Essential (primary) hypertension: Secondary | ICD-10-CM

## 2021-01-19 NOTE — Telephone Encounter (Signed)
Spoke with patient to schedule appt with provider for med refill.

## 2021-01-20 ENCOUNTER — Other Ambulatory Visit (HOSPITAL_COMMUNITY): Payer: Self-pay | Admitting: Physician Assistant

## 2021-01-20 ENCOUNTER — Ambulatory Visit: Payer: Self-pay | Admitting: Registered Nurse

## 2021-01-20 DIAGNOSIS — R0609 Other forms of dyspnea: Secondary | ICD-10-CM | POA: Diagnosis not present

## 2021-01-20 DIAGNOSIS — R0601 Orthopnea: Secondary | ICD-10-CM | POA: Diagnosis not present

## 2021-01-20 DIAGNOSIS — F419 Anxiety disorder, unspecified: Secondary | ICD-10-CM | POA: Diagnosis not present

## 2021-01-20 DIAGNOSIS — K219 Gastro-esophageal reflux disease without esophagitis: Secondary | ICD-10-CM | POA: Diagnosis not present

## 2021-01-20 DIAGNOSIS — E119 Type 2 diabetes mellitus without complications: Secondary | ICD-10-CM | POA: Diagnosis not present

## 2021-01-20 DIAGNOSIS — R197 Diarrhea, unspecified: Secondary | ICD-10-CM | POA: Diagnosis not present

## 2021-01-20 DIAGNOSIS — I10 Essential (primary) hypertension: Secondary | ICD-10-CM | POA: Diagnosis not present

## 2021-01-20 DIAGNOSIS — F5221 Male erectile disorder: Secondary | ICD-10-CM | POA: Diagnosis not present

## 2021-01-21 ENCOUNTER — Encounter: Payer: Self-pay | Admitting: Registered Nurse

## 2021-01-26 ENCOUNTER — Other Ambulatory Visit (HOSPITAL_COMMUNITY): Payer: Self-pay | Admitting: Physician Assistant

## 2021-02-26 MED FILL — LISINOPRIL 10 MG TABS: 10 | 90 days supply | Qty: 90 | Fill #0

## 2021-03-09 ENCOUNTER — Ambulatory Visit: Payer: BC Managed Care – PPO | Admitting: Gastroenterology

## 2021-04-18 MED FILL — Fe Fum-Iron Poly Cmplx-FA-B Cmplx-C-Biotin-Probiotic Cap: ORAL | 30 days supply | Qty: 30 | Fill #0 | Status: AC

## 2021-04-19 ENCOUNTER — Other Ambulatory Visit (HOSPITAL_COMMUNITY): Payer: Self-pay

## 2021-06-14 MED FILL — Fe Fum-Iron Poly Cmplx-FA-B Cmplx-C-Biotin-Probiotic Cap: ORAL | 30 days supply | Qty: 30 | Fill #1 | Status: AC

## 2021-06-15 ENCOUNTER — Other Ambulatory Visit (HOSPITAL_COMMUNITY): Payer: Self-pay

## 2021-06-15 MED FILL — Amlodipine Besylate Tab 5 MG (Base Equivalent): ORAL | 90 days supply | Qty: 90 | Fill #0 | Status: AC

## 2021-06-15 MED FILL — Metformin HCl Tab 500 MG: ORAL | 90 days supply | Qty: 180 | Fill #0 | Status: AC

## 2021-07-14 MED FILL — Lisinopril Tab 10 MG: ORAL | 60 days supply | Qty: 60 | Fill #0 | Status: AC

## 2021-07-14 MED FILL — Omeprazole Cap Delayed Release 20 MG: ORAL | 90 days supply | Qty: 180 | Fill #0 | Status: AC

## 2021-07-15 ENCOUNTER — Other Ambulatory Visit (HOSPITAL_COMMUNITY): Payer: Self-pay

## 2021-09-22 DIAGNOSIS — Z125 Encounter for screening for malignant neoplasm of prostate: Secondary | ICD-10-CM | POA: Diagnosis not present

## 2021-09-22 DIAGNOSIS — E119 Type 2 diabetes mellitus without complications: Secondary | ICD-10-CM | POA: Diagnosis not present

## 2021-09-28 DIAGNOSIS — E782 Mixed hyperlipidemia: Secondary | ICD-10-CM | POA: Diagnosis not present

## 2021-09-28 DIAGNOSIS — I517 Cardiomegaly: Secondary | ICD-10-CM | POA: Diagnosis not present

## 2021-09-28 DIAGNOSIS — Z0000 Encounter for general adult medical examination without abnormal findings: Secondary | ICD-10-CM | POA: Diagnosis not present

## 2021-09-28 DIAGNOSIS — I10 Essential (primary) hypertension: Secondary | ICD-10-CM | POA: Diagnosis not present

## 2021-09-28 DIAGNOSIS — R0600 Dyspnea, unspecified: Secondary | ICD-10-CM | POA: Diagnosis not present

## 2021-10-01 ENCOUNTER — Other Ambulatory Visit (HOSPITAL_COMMUNITY): Payer: Self-pay

## 2021-10-02 MED FILL — Amlodipine Besylate Tab 5 MG (Base Equivalent): ORAL | 90 days supply | Qty: 90 | Fill #1 | Status: CN

## 2021-10-04 ENCOUNTER — Other Ambulatory Visit (HOSPITAL_COMMUNITY): Payer: Self-pay

## 2021-10-04 ENCOUNTER — Other Ambulatory Visit: Payer: Self-pay

## 2021-10-04 MED ORDER — AMLODIPINE BESYLATE 5 MG PO TABS
5.0000 mg | ORAL_TABLET | Freq: Every day | ORAL | 0 refills | Status: DC
  Filled 2021-10-04: qty 90, 90d supply, fill #0

## 2021-10-04 MED ORDER — FUROSEMIDE 20 MG PO TABS
20.0000 mg | ORAL_TABLET | Freq: Every day | ORAL | 1 refills | Status: DC
  Filled 2021-10-04 (×2): qty 90, 90d supply, fill #0

## 2021-10-04 MED FILL — Metformin HCl Tab 500 MG: ORAL | 30 days supply | Qty: 60 | Fill #1 | Status: AC

## 2021-10-05 ENCOUNTER — Other Ambulatory Visit (HOSPITAL_COMMUNITY): Payer: Self-pay

## 2021-10-05 MED ORDER — FENOFIBRATE 145 MG PO TABS
145.0000 mg | ORAL_TABLET | Freq: Every day | ORAL | 1 refills | Status: DC
  Filled 2021-10-05: qty 90, 90d supply, fill #0
  Filled 2021-12-30: qty 90, 90d supply, fill #1

## 2021-10-05 MED ORDER — FUROSEMIDE 20 MG PO TABS
20.0000 mg | ORAL_TABLET | Freq: Every day | ORAL | 1 refills | Status: DC
  Filled 2021-10-05: qty 90, 90d supply, fill #0

## 2021-10-05 MED ORDER — LISINOPRIL 20 MG PO TABS
20.0000 mg | ORAL_TABLET | Freq: Every day | ORAL | 1 refills | Status: DC
  Filled 2021-10-05: qty 90, 90d supply, fill #0

## 2021-10-05 MED ORDER — ATORVASTATIN CALCIUM 20 MG PO TABS
20.0000 mg | ORAL_TABLET | Freq: Every day | ORAL | 1 refills | Status: DC
  Filled 2021-10-05: qty 90, 90d supply, fill #0
  Filled 2021-12-30: qty 90, 90d supply, fill #1

## 2021-11-02 NOTE — Progress Notes (Signed)
Date:  11/03/2021   ID:  Thomas Yoder, DOB 07-12-1961, MRN [id number]  PCP:  Everardo Beals, NP  Cardiologist:  Rex Kras, DO, [facility]  (established care 11/03/2021) Former Cardiology Providers: Dr. Fransico Him   REASON FOR CONSULT: Dyspnea on exertion  REQUESTING PHYSICIAN:  Everardo Beals, NP [place]  [id number]   Chief Complaint  Patient presents with   Shortness of Breath   New Patient (Initial Visit)    HPI  Thomas Yoder is a 60 y.o. African-American male who presents to the office with a chief complaint of "shortness of breath." Patient's past medical history and cardiovascular risk factors include: Diabetes, hypertension, intermittent noncompliance, tobacco use, snoring, nonischemic cardiomyopathy, nonobstructive CAD by CT 11/2018, former smoker, noncompliance, obesity due to excess calories.  He is referred to the office at the request of [name], MD for evaluation of dyspnea on exertion.  Last seen in our office back in 2015 and later transitioned his care to [facility] heart care and now is referred to our practice again at the request of his PCP.  Patient is accompanied by his wife Marjette at today's office visit.   Patient states that he has been short of breath for the last several years and now referred to cardiology given her wife's concern of possible progressive cardiovascular disease.  Patient used to follow-up with Dr. Golden Hurter is lost to follow-up.  Patient's wife states that his shortness of breath is getting progressively worse.  Symptoms are more pronounced with effort related activities.  He also experiences paroxysmal nocturnal dyspnea.  He denies orthopnea or lower extremity swelling.  Patient states that he has gained approximately 10 pounds in the last 5 years I suspect due to dietary indiscretion.  Patient states that he is very active as he works 2 jobs and is able to walk 35,000 steps per day.    Patient's  wife also states that he naps very easily, feels tired and fatigued, and not well rested.  Patient has a history of sleep apnea and chooses not to be on CPAP.  FUNCTIONAL STATUS: No structured exercise program or daily routine. But active with working two jobs and claims to walk 35,000 steps.    ALLERGIES: No Known Allergies  MEDICATION LIST PRIOR TO VISIT: Current Meds  Medication Sig   atorvastatin (LIPITOR) 20 MG tablet Take 1 tablet (20 mg total) by mouth daily.   Blood Pressure Monitoring (ADULT BLOOD PRESSURE CUFF LG) KIT Check blood pressure daily and record readings.   Blood Pressure Monitoring (BLOOD PRESSURE KIT) DEVI 1 Device by Does not apply route daily.   fenofibrate (TRICOR) 145 MG tablet Take 1 tablet (145 mg total) by mouth daily.   furosemide (LASIX) 20 MG tablet Take 1 tablet (20 mg total) by mouth daily.   hydrALAZINE (APRESOLINE) 25 MG tablet Take 1 tablet (25 mg total) by mouth 3 (three) times daily.   metFORMIN (GLUCOPHAGE) 500 MG tablet TAKE 1 TABLET BY MOUTH 2 TIMES A DAY   omeprazole (PRILOSEC) 20 MG capsule TAKE 2 CAPSULES BY MOUTH EVERY DAY   sacubitril-valsartan (ENTRESTO) 49-51 MG Take 1 tablet by mouth 2 (two) times daily. Stop lisinopril and start 48hr later   sildenafil (VIAGRA) 100 MG tablet TAKE 1 TABLET BY MOUTH EVERY DAY AS NEEDED   [DISCONTINUED] amLODipine (NORVASC) 5 MG tablet Take 1 tablet (5 mg total) by mouth daily.   [DISCONTINUED] lisinopril (ZESTRIL) 20 MG tablet Take 1 tablet (20 mg total) by  mouth daily.     PAST MEDICAL HISTORY: Past Medical History:  Diagnosis Date   CAD (coronary artery disease)    a. Cardiac CT showed calcium score of 83rd percentile, mild stenosis in the proximal LCx and the proximal and mid RCA, negative FFR for hemodynamic significance.   Chest pain    Chronic combined systolic and diastolic CHF (congestive heart failure) ([place]) 12/11/2018   Depression    Diabetes mellitus without complication ([place])    Former  tobacco use    H/O noncompliance with medical treatment, presenting hazards to health    Hyperlipidemia    Hypertension    NICM (nonischemic cardiomyopathy) ([place])    a. EF 40-45% by echo 11/2018.   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  SOCIAL HISTORY:  The patient  reports that he quit smoking about 3 years ago. His smoking use included cigarettes. He has a 17.00 pack-year smoking history. He has never used smokeless tobacco. He reports that he does not currently use drugs after having used the following drugs: Marijuana. He reports that he does not drink alcohol.  REVIEW OF SYSTEMS: Review of Systems  Constitutional: Negative for chills and fever.  HENT:  Negative for hoarse voice and nosebleeds.   Eyes:  Negative for discharge, double vision and pain.  Cardiovascular:  Positive for paroxysmal nocturnal dyspnea. Negative for chest pain, claudication, dyspnea on exertion, leg swelling, near-syncope, orthopnea, palpitations and syncope.  Respiratory:  Positive for shortness of breath and snoring. Negative for hemoptysis.        Daytime sleepiness   Musculoskeletal:  Negative for muscle cramps and myalgias.  Gastrointestinal:  Negative for abdominal pain, constipation, diarrhea, hematemesis, hematochezia, melena, nausea and vomiting.  Neurological:  Negative for dizziness and light-headedness.   PHYSICAL EXAM: Vitals with BMI 11/03/2021 11/03/2021 08/26/2020  Height - _0  _1   Weight - 251 lbs 233 lbs  BMI - 85.6 31.49  Systolic 702 637 858   Diastolic 850 277 85  Pulse 68 72 -    CONSTITUTIONAL: Well-developed and well-nourished. No acute distress.  SKIN: Skin is warm and dry. No rash noted. No cyanosis. No pallor. No jaundice HEAD: Normocephalic and atraumatic.  EYES: No scleral icterus MOUTH/THROAT: Moist oral membranes.  NECK: No JVD present. No thyromegaly noted. No carotid bruits  LYMPHATIC: No visible cervical adenopathy.  CHEST Normal respiratory effort. No intercostal retractions  LUNGS: Clear to auscultation bilaterally.  No stridor. No wheezes. No rales.  CARDIOVASCULAR: Regular rate and rhythm, positive S1-S2, no murmurs rubs or gallops appreciated. ABDOMINAL: Obese, soft, nontender, nondistended, positive bowel sounds all 4 quadrants. No apparent ascites.  EXTREMITIES: No peripheral edema, warm to touch,  HEMATOLOGIC: No significant bruising NEUROLOGIC: Oriented to person, place, and time. Nonfocal. Normal muscle tone.  PSYCHIATRIC: Normal mood and affect. Normal behavior. Cooperative  CARDIAC DATABASE: EKG: 11/03/2021: NSR, 70 bpm, PRWP, nonspecific T-abnormality, without underlying injury pattern.  Echocardiogram: 11/2018: LVEF 40-45%, mildly reduced left ventricular systolic function, mild LVH, mild hypokinesis, grade 1 diastolic impairment, mild  Lee dilated left atrium, PASP 33 mmHg.   Stress Testing: Myocardial perfusion imaging study 03/31/2014: Moderate size, mild ischemia in the inferior wall extending from the base towards the apex.  Inferior wall hypokinesis, LVEF 49% scan study.  Heart Catheterization: None  LABORATORY DATA: CBC Latest Ref Rng & Units 02/04/2020 11/28/2018 11/26/2018  WBC 4.0 - 10.5 K/uL 5.1 5.5 5.5  Hemoglobin 13.0 - 17.0 g/dL 13.2 13.9 14.1  Hematocrit 39.0 - 52.0 % 42.9 45.6 47.0  Platelets 150 - 400 K/uL 222 201 222    CMP Latest Ref Rng & Units 02/04/2020 10/17/2019 12/11/2018  Glucose 70 - 99 mg/dL 165(H) 110(H) 121(H)  BUN 6 - 20 mg/dL _0 Creatinine 0.61 -  1.24 mg/dL 1.00 0.97 0.98  Sodium 135 - 145 mmol/L 141 142 139  Potassium 3.5 - 5.1 mmol/L 3.8 4.0 4.1  Chloride 98 - 111 mmol/L 106 100 101  CO2 22 - 32 mmol/L _1 Calcium 8.9 - 10.3 mg/dL 9.2 9.6 9.3  Total Protein 6.5 - 8.1 g/dL - - -  Total Bilirubin 0.3 - 1.2 mg/dL - - -  Alkaline Phos 38 - 126 U/L - - -  AST 15 - 41 U/L - - -  ALT 0 - 44 IU/L - 13 -    Lipid Panel  Lab Results  Component Value Date   CHOL 122 10/17/2019   HDL 36 (L) 10/17/2019   LDLCALC 54 10/17/2019   TRIG 194 (H) 10/17/2019   CHOLHDL 3.4 10/17/2019    No components found for: NTPROBNP No results for input(s): PROBNP in the last 8760 hours. No results for input(s): TSH in the last 8760 hours.  BMP No results for input(s): NA, K, CL, CO2, GLUCOSE, BUN, CREATININE, CALCIUM, GFRNONAA, GFRAA in the last 8760 hours.  HEMOGLOBIN A1C Lab Results  Component Value Date   HGBA1C 6.4 (H) 11/26/2018   MPG 136.98 11/26/2018   External Labs:  Date Collected: 09/23/2021 , information obtained by [facility] Potassium: 4.4 Creatinine 1.01 mg/dL. eGFR: 86 mL/min per 1.73 m Hemoglobin: 14.3 g/dL and hematocrit: 45.2 % Lipid profile: Total cholesterol 209 , triglycerides 451 , HDL 34 , non-HDL 175 AST: 17 , ALT: 23 , alkaline phosphatase: 119  BNP 40 Hemoglobin A1c: 6.6 TSH: 2.21    IMPRESSION:    ICD-10-CM   1. Shortness of breath  R06.02 PCV ECHOCARDIOGRAM COMPLETE    2. Nonobstructive atherosclerosis of coronary artery  I25.10 PCV ECHOCARDIOGRAM COMPLETE    3. Cardiomyopathy, unspecified type (HCC)  I42.9 sacubitril-valsartan (ENTRESTO) 49-51 MG    hydrALAZINE (APRESOLINE) 25 MG tablet    4. Essential hypertension, benign  I10 EKG 12-Lead    5. Chronic combined systolic and diastolic heart failure (HCC)  I50.42 sacubitril-valsartan (ENTRESTO) 49-51 MG    hydrALAZINE (APRESOLINE) 25 MG tablet    PCV ECHOCARDIOGRAM COMPLETE    Basic metabolic panel    Magnesium    Pro b natriuretic peptide (BNP)     6. Non-insulin dependent type 2 diabetes mellitus (HCC)  E11.9     7. Mixed hyperlipidemia  E78.2     8. Hypertriglyceridemia  E78.1     9. OSA (obstructive sleep apnea)  G47.33     10. Class 2 severe obesity due to excess calories with serious comorbidity and body mass index (BMI) of 39.0 to 39.9 in adult [facility])  E66.01    Z68.39        RECOMMENDATIONS: Keefer Soulliere is a 60 y.o.  African-American male whose past medical history and cardiac risk factors include: Diabetes, hypertension, intermittent noncompliance, tobacco use, snoring, nonischemic cardiomyopathy, nonobstructive CAD by CT 11/2018, former smoker, noncompliance, obesity due to excess calories.  Patient presents to the office at the request of his PCP and his wife is concerned that shortness of breath with effort related activities getting progressively worse.  Patient is noted to have cardiomyopathy with mildly reduced LVEF and diastolic dysfunction.  He underwent a coronary CTA which noted coronary artery calcification and nonobstructive CAD.  He was educated on importance of improving his modifiable cardiovascular risk factors.  However, since then he has not had regular follow-ups with either our practice or with [facility] where he was following Dr. Golden Hurter.  I suspect that he has poor insight on his overall medical condition.  Given his shortness of breath, history of cardiomyopathy, and chronic combined systolic and diastolic heart failure educated patient on the importance of uptitrating his medical therapy.  Discontinue amlodipine.  Discontinue lisinopril.  After 48 hours from the last lisinopril dose patient is asked to start Entresto 49/51 mg p.o. twice daily.  Blood work in 1 week to evaluate kidney function and electrolytes.  We will also start hydralazine 25 mg p.o. 3 times daily.  Given the patient's non-insulin-dependent diabetes recommend a goal LDL of less than 70 mg/dL.  Patient's triglyceride levels are now well  controlled most likely due to dietary indiscretion.  I have asked him to discuss seeking referral to a dietitian by his PCP and if the triglyceride levels are still uncontrolled consider pharmacological therapy.  Patient would like to avoid additional medications at this time and will discuss it further with PCP.  Patient also experiences tired, fatigue, frequently falls asleep.  I suspect this is most likely secondary to untreated sleep apnea.  Based on the last office note from Dr. Radford Pax in September 2021 patient is noted to have obstructive and central sleep apnea.  I have asked him the importance of using his CPAP and to follow-up with a sleep provider.  If a refferal is needed I have asked him to reach out to his PCP.   FINAL MEDICATION LIST END OF ENCOUNTER: Meds ordered this encounter  Medications   sacubitril-valsartan (ENTRESTO) 49-51 MG    Sig: Take 1 tablet by mouth 2 (two) times daily. Stop lisinopril and start 48hr later    Dispense:  180 tablet    Refill:  0   hydrALAZINE (APRESOLINE) 25 MG tablet    Sig: Take 1 tablet (25 mg total) by mouth 3 (three) times daily.    Dispense:  90 tablet    Refill:  0     Medications Discontinued During This Encounter  Medication Reason   lisinopril (ZESTRIL) 10 MG tablet Error   metFORMIN (GLUCOPHAGE) 500 MG tablet Error   lisinopril (ZESTRIL) 40 MG tablet Error   sildenafil (REVATIO) 20 MG tablet Error   amLODipine (NORVASC) 10 MG tablet Error   atorvastatin (LIPITOR) 80 MG tablet Error   carvedilol (COREG) 12.5 MG tablet Error   dicyclomine (BENTYL) 20 MG tablet Error   furosemide (LASIX) 20 MG tablet Error   hydrochlorothiazide (HYDRODIURIL) 25 MG tablet Error   Hydrocortisone Acetate 1 % OINT Error   Icosapent Ethyl 1 g CAPS Error   Iron-FA-B Cmp-C-Biot-Probiotic (FUSION PLUS) CAPS Error   amLODipine (NORVASC) 5 MG tablet Discontinued by provider   lisinopril (ZESTRIL) 20 MG tablet Change in therapy     Current Outpatient  Medications:  atorvastatin (LIPITOR) 20 MG tablet, Take 1 tablet (20 mg total) by mouth daily., Disp: 90 tablet, Rfl: 1   Blood Pressure Monitoring (ADULT BLOOD PRESSURE CUFF LG) KIT, Check blood pressure daily and record readings., Disp: 1 each, Rfl: 0   Blood Pressure Monitoring (BLOOD PRESSURE KIT) DEVI, 1 Device by Does not apply route daily., Disp: 1 Device, Rfl: 0   fenofibrate (TRICOR) 145 MG tablet, Take 1 tablet (145 mg total) by mouth daily., Disp: 90 tablet, Rfl: 1   furosemide (LASIX) 20 MG tablet, Take 1 tablet (20 mg total) by mouth daily., Disp: 90 tablet, Rfl: 1   hydrALAZINE (APRESOLINE) 25 MG tablet, Take 1 tablet (25 mg total) by mouth 3 (three) times daily., Disp: 90 tablet, Rfl: 0   metFORMIN (GLUCOPHAGE) 500 MG tablet, TAKE 1 TABLET BY MOUTH 2 TIMES A DAY, Disp: 300 tablet, Rfl: 0   omeprazole (PRILOSEC) 20 MG capsule, TAKE 2 CAPSULES BY MOUTH EVERY DAY, Disp: 360 capsule, Rfl: 0   sacubitril-valsartan (ENTRESTO) 49-51 MG, Take 1 tablet by mouth 2 (two) times daily. Stop lisinopril and start 48hr later, Disp: 180 tablet, Rfl: 0   sildenafil (VIAGRA) 100 MG tablet, TAKE 1 TABLET BY MOUTH EVERY DAY AS NEEDED, Disp: 39 tablet, Rfl: 0   aspirin EC 81 MG EC tablet, Take 1 tablet (81 mg total) by mouth daily. (Patient not taking: Reported on 11/03/2021), Disp: , Rfl:   Orders Placed This Encounter  Procedures   Basic metabolic panel   Magnesium   Pro b natriuretic peptide (BNP)   EKG 12-Lead   PCV ECHOCARDIOGRAM COMPLETE    There are no Patient Instructions on file for this visit.   --Continue cardiac medications as reconciled in final medication list. --Return in about 2 weeks (around 11/17/2021) for Follow up, heart failure management.. Or sooner if needed. --Continue follow-up with your primary care physician regarding the management of your other chronic comorbid conditions.  Patient's questions and concerns were addressed to his satisfaction. He voices understanding  of the instructions provided during this encounter.   This note was created using a voice recognition software as a result there may be grammatical errors inadvertently enclosed that do not reflect the nature of this encounter. Every attempt is made to correct such errors.  Rex Kras, [place], [facility]  Pager: [phone] Office: [phone]

## 2021-11-03 ENCOUNTER — Encounter: Payer: Self-pay | Admitting: Cardiology

## 2021-11-03 ENCOUNTER — Other Ambulatory Visit (HOSPITAL_COMMUNITY): Payer: Self-pay

## 2021-11-03 ENCOUNTER — Other Ambulatory Visit: Payer: Self-pay

## 2021-11-03 ENCOUNTER — Ambulatory Visit: Payer: 59 | Admitting: Cardiology

## 2021-11-03 VITALS — BP 153/100 | HR 68 | Temp 98.0°F | Resp 16 | Ht 67.0 in | Wt 251.0 lb

## 2021-11-03 DIAGNOSIS — E781 Pure hyperglyceridemia: Secondary | ICD-10-CM

## 2021-11-03 DIAGNOSIS — I2510 Atherosclerotic heart disease of native coronary artery without angina pectoris: Secondary | ICD-10-CM | POA: Diagnosis not present

## 2021-11-03 DIAGNOSIS — G4733 Obstructive sleep apnea (adult) (pediatric): Secondary | ICD-10-CM

## 2021-11-03 DIAGNOSIS — R0602 Shortness of breath: Secondary | ICD-10-CM

## 2021-11-03 DIAGNOSIS — E119 Type 2 diabetes mellitus without complications: Secondary | ICD-10-CM

## 2021-11-03 DIAGNOSIS — E782 Mixed hyperlipidemia: Secondary | ICD-10-CM | POA: Diagnosis not present

## 2021-11-03 DIAGNOSIS — I5042 Chronic combined systolic (congestive) and diastolic (congestive) heart failure: Secondary | ICD-10-CM

## 2021-11-03 DIAGNOSIS — I10 Essential (primary) hypertension: Secondary | ICD-10-CM

## 2021-11-03 DIAGNOSIS — E66812 Obesity, class 2: Secondary | ICD-10-CM

## 2021-11-03 DIAGNOSIS — Z6839 Body mass index (BMI) 39.0-39.9, adult: Secondary | ICD-10-CM | POA: Diagnosis not present

## 2021-11-03 DIAGNOSIS — I429 Cardiomyopathy, unspecified: Secondary | ICD-10-CM

## 2021-11-03 MED ORDER — HYDRALAZINE HCL 25 MG PO TABS
25.0000 mg | ORAL_TABLET | Freq: Three times a day (TID) | ORAL | 0 refills | Status: DC
  Filled 2021-11-03: qty 90, 30d supply, fill #0

## 2021-11-03 MED ORDER — ENTRESTO 49-51 MG PO TABS
1.0000 | ORAL_TABLET | Freq: Two times a day (BID) | ORAL | 0 refills | Status: DC
  Filled 2021-11-03: qty 180, 90d supply, fill #0

## 2021-11-14 ENCOUNTER — Other Ambulatory Visit (HOSPITAL_COMMUNITY): Payer: Self-pay

## 2021-11-15 ENCOUNTER — Other Ambulatory Visit: Payer: Self-pay | Admitting: Cardiology

## 2021-11-15 DIAGNOSIS — I5042 Chronic combined systolic (congestive) and diastolic (congestive) heart failure: Secondary | ICD-10-CM | POA: Diagnosis not present

## 2021-11-16 LAB — PRO B NATRIURETIC PEPTIDE: NT-Pro BNP: 36 pg/mL (ref 0–210)

## 2021-11-16 LAB — BASIC METABOLIC PANEL
BUN/Creatinine Ratio: 17 (ref 9–20)
BUN: 18 mg/dL (ref 6–24)
CO2: 19 mmol/L — ABNORMAL LOW (ref 20–29)
Calcium: 9.8 mg/dL (ref 8.7–10.2)
Chloride: 107 mmol/L — ABNORMAL HIGH (ref 96–106)
Creatinine, Ser: 1.09 mg/dL (ref 0.76–1.27)
Glucose: 105 mg/dL — ABNORMAL HIGH (ref 70–99)
Potassium: 4.5 mmol/L (ref 3.5–5.2)
Sodium: 145 mmol/L — ABNORMAL HIGH (ref 134–144)
eGFR: 78 mL/min/{1.73_m2} (ref >59)

## 2021-11-16 LAB — MAGNESIUM: Magnesium: 2.1 mg/dL (ref 1.6–2.3)

## 2021-11-17 ENCOUNTER — Other Ambulatory Visit (HOSPITAL_COMMUNITY): Payer: Self-pay

## 2021-11-18 ENCOUNTER — Other Ambulatory Visit (HOSPITAL_COMMUNITY): Payer: Self-pay

## 2021-11-18 MED ORDER — OMEPRAZOLE 20 MG PO CPDR
40.0000 mg | DELAYED_RELEASE_CAPSULE | Freq: Every day | ORAL | 0 refills | Status: DC
  Filled 2021-11-18: qty 180, 90d supply, fill #0

## 2021-11-19 ENCOUNTER — Other Ambulatory Visit (HOSPITAL_COMMUNITY): Payer: Self-pay

## 2021-11-19 MED ORDER — METFORMIN HCL 500 MG PO TABS
500.0000 mg | ORAL_TABLET | Freq: Two times a day (BID) | ORAL | 1 refills | Status: DC
  Filled 2021-11-19: qty 180, 90d supply, fill #0
  Filled 2022-02-16: qty 180, 90d supply, fill #1

## 2021-11-19 MED ORDER — OMEPRAZOLE 20 MG PO CPDR
40.0000 mg | DELAYED_RELEASE_CAPSULE | Freq: Every day | ORAL | 1 refills | Status: DC
  Filled 2021-11-19: qty 180, 90d supply, fill #0

## 2021-11-19 NOTE — Progress Notes (Signed)
Tried calling patient no answer left a vm to call back

## 2021-11-22 ENCOUNTER — Other Ambulatory Visit: Payer: Self-pay

## 2021-11-22 ENCOUNTER — Other Ambulatory Visit (HOSPITAL_COMMUNITY): Payer: Self-pay

## 2021-11-22 ENCOUNTER — Ambulatory Visit: Payer: 59

## 2021-11-22 DIAGNOSIS — I2510 Atherosclerotic heart disease of native coronary artery without angina pectoris: Secondary | ICD-10-CM | POA: Diagnosis not present

## 2021-11-22 DIAGNOSIS — I10 Essential (primary) hypertension: Secondary | ICD-10-CM | POA: Diagnosis not present

## 2021-11-22 DIAGNOSIS — I5042 Chronic combined systolic (congestive) and diastolic (congestive) heart failure: Secondary | ICD-10-CM | POA: Diagnosis not present

## 2021-11-22 DIAGNOSIS — R0602 Shortness of breath: Secondary | ICD-10-CM | POA: Diagnosis not present

## 2021-11-22 DIAGNOSIS — M545 Low back pain, unspecified: Secondary | ICD-10-CM | POA: Diagnosis not present

## 2021-11-22 MED ORDER — METHOCARBAMOL 500 MG PO TABS
1000.0000 mg | ORAL_TABLET | Freq: Four times a day (QID) | ORAL | 0 refills | Status: DC | PRN
  Filled 2021-11-22: qty 120, 15d supply, fill #0

## 2021-11-22 MED ORDER — METHOCARBAMOL 500 MG PO TABS: 1000.0000 mg | ORAL_TABLET | Freq: Four times a day (QID) | ORAL | 0 refills | Status: DC | PRN

## 2021-11-22 MED ORDER — METHYLPREDNISOLONE 4 MG PO TBPK
21.0000 mg | ORAL_TABLET | ORAL | 0 refills | Status: DC
  Filled 2021-11-22: qty 21, 7d supply, fill #0

## 2021-11-23 ENCOUNTER — Other Ambulatory Visit: Payer: Self-pay

## 2021-11-23 DIAGNOSIS — I5042 Chronic combined systolic (congestive) and diastolic (congestive) heart failure: Secondary | ICD-10-CM

## 2021-11-29 ENCOUNTER — Ambulatory Visit: Payer: 59 | Admitting: Cardiology

## 2021-11-29 ENCOUNTER — Encounter: Payer: Self-pay | Admitting: Cardiology

## 2021-11-29 ENCOUNTER — Other Ambulatory Visit: Payer: Self-pay

## 2021-11-29 ENCOUNTER — Other Ambulatory Visit (HOSPITAL_COMMUNITY): Payer: Self-pay

## 2021-11-29 VITALS — BP 156/101 | HR 68 | Resp 16 | Ht 67.0 in | Wt 249.0 lb

## 2021-11-29 DIAGNOSIS — I2510 Atherosclerotic heart disease of native coronary artery without angina pectoris: Secondary | ICD-10-CM | POA: Diagnosis not present

## 2021-11-29 DIAGNOSIS — E781 Pure hyperglyceridemia: Secondary | ICD-10-CM

## 2021-11-29 DIAGNOSIS — E782 Mixed hyperlipidemia: Secondary | ICD-10-CM | POA: Diagnosis not present

## 2021-11-29 DIAGNOSIS — I429 Cardiomyopathy, unspecified: Secondary | ICD-10-CM | POA: Diagnosis not present

## 2021-11-29 DIAGNOSIS — I10 Essential (primary) hypertension: Secondary | ICD-10-CM

## 2021-11-29 DIAGNOSIS — E119 Type 2 diabetes mellitus without complications: Secondary | ICD-10-CM | POA: Diagnosis not present

## 2021-11-29 DIAGNOSIS — Z6839 Body mass index (BMI) 39.0-39.9, adult: Secondary | ICD-10-CM | POA: Diagnosis not present

## 2021-11-29 DIAGNOSIS — G4733 Obstructive sleep apnea (adult) (pediatric): Secondary | ICD-10-CM

## 2021-11-29 MED ORDER — CARVEDILOL 6.25 MG PO TABS
6.2500 mg | ORAL_TABLET | Freq: Two times a day (BID) | ORAL | 0 refills | Status: DC
Start: 2021-11-29 — End: 2022-02-28
  Filled 2021-11-29: qty 180, 90d supply, fill #0

## 2021-11-29 MED ORDER — ENTRESTO 97-103 MG PO TABS: 1.0000 | ORAL_TABLET | Freq: Two times a day (BID) | ORAL | 0 refills | Status: DC

## 2021-11-29 NOTE — Progress Notes (Signed)
Date:  11/29/2021   ID:  Thomas Yoder, DOB August 22, 1961, MRN [id number]  PCP:  Everardo Beals, NP  Cardiologist:  Rex Kras, DO, [facility]  (established care 11/03/2021) Former Cardiology Providers: Dr. Fransico Him   Date: 11/29/21 Last Office Visit: 11/03/2021  Chief Complaint  Patient presents with    heart failure management   Follow-up    HPI  Thomas Yoder is a 60 y.o. 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  He is referred to the office at the request of Thomas Moron, MD for evaluation of dyspnea on exertion.  Last seen in the office in 2015 and later care was transitioned to Dr. Golden Hurter at [facility] and now is referred back to our practice as of 10/2021.  Given his history of nonischemic cardiomyopathy and symptoms of shortness of breath with effort related activities the shared decision was to proceed with echocardiogram and up titration of GDMT.  Echocardiogram illustrates recovered cardiomyopathy with recent LVEF noted to be 55 to 60% as compared to 40-45%.  Patient also has a history of nonobstructive CAD.  Patient states that his shortness of breath with effort related activity has improved since last office visit where we discontinued amlodipine and lisinopril and started him on Entresto.  Repeat blood work 11/15/2021 independently reviewed notes stable renal function and NT proBNP within normal limits.  Patient states that his blood pressures continue to be high and not well controlled.  He is currently running out of hydralazine and requesting refill.  Patient also states that his lifestyle  changes but also improved.  He has gained approximately 10 pounds in the last 5 years due to dietary indiscretion.  He works 2 jobs and is able to walk 35,000 steps per day.  Denies any active chest pain at rest or with effort related activities.  At the last office visit he was also recommended to establish care with sleep medicine given his history of sleep apnea.  He is requesting a referral.   FUNCTIONAL STATUS: No structured exercise program or daily routine. But active with working two jobs and claims to walk 35,000 steps.    ALLERGIES: No Known Allergies  MEDICATION LIST PRIOR TO VISIT: Current Meds  Medication Sig   aspirin EC 81 MG EC tablet Take 1 tablet (81 mg total) by mouth daily.   atorvastatin (LIPITOR) 20 MG tablet Take 1 tablet (20 mg total) by mouth daily.   Blood Pressure Monitoring (ADULT BLOOD PRESSURE CUFF LG) KIT Check blood pressure daily and record readings.   Blood Pressure Monitoring (BLOOD PRESSURE KIT) DEVI 1 Device by Does not apply route daily.   carvedilol (COREG) 6.25 MG tablet Take 1 tablet (6.25 mg total) by mouth 2 (two) times daily.   fenofibrate (TRICOR) 145 MG tablet Take 1 tablet (145 mg total) by mouth daily.   metFORMIN (GLUCOPHAGE) 500 MG tablet Take 1 tablet (500 mg total) by mouth 2 (two) times daily.   methocarbamol (ROBAXIN) 500 MG tablet Take 2 tablets (1,000 mg total) by mouth 4 (four) times daily as needed.   methylPREDNISolone (MEDROL) 4 MG TBPK tablet Take as directed   sacubitril-valsartan (ENTRESTO) 97-103 MG Take 1 tablet by mouth 2 (two) times daily.   [  DISCONTINUED] furosemide (LASIX) 20 MG tablet Take 1 tablet (20 mg total) by mouth daily.   [DISCONTINUED] hydrALAZINE (APRESOLINE) 25 MG tablet Take 1 tablet (25 mg total) by mouth 3 (three) times daily.   [DISCONTINUED] sacubitril-valsartan (ENTRESTO) 49-51 MG Take 1 tablet by mouth 2 (two) times daily. Stop lisinopril and start 48hr later     PAST MEDICAL HISTORY: Past Medical  History:  Diagnosis Date   CAD (coronary artery disease)    a. Cardiac CT showed calcium score of 83rd percentile, mild stenosis in the proximal LCx and the proximal and mid RCA, negative FFR for hemodynamic significance.   Chest pain    Chronic combined systolic and diastolic CHF (congestive heart failure) ([place]) 12/11/2018   Depression    Diabetes mellitus without complication ([place])    Former tobacco use    H/O noncompliance with medical treatment, presenting hazards to health    Hyperlipidemia    Hypertension    NICM (nonischemic cardiomyopathy) ([place])    a. EF 40-45% by echo 11/2018.   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  SOCIAL HISTORY:  The patient  reports that he quit smoking about 3 years ago. His smoking use included cigarettes. He has a 17.00 pack-year smoking history. He has never used smokeless tobacco. He reports that he does not currently use drugs after having used the following drugs: Marijuana. He reports that he does not drink alcohol.  REVIEW OF SYSTEMS: Review of Systems  Constitutional: Negative for chills and fever.  HENT:  Negative for hoarse voice and nosebleeds.   Eyes:  Negative for discharge, double vision and pain.  Cardiovascular:  Negative for chest pain, claudication, dyspnea on exertion, leg swelling, near-syncope, orthopnea, palpitations, paroxysmal nocturnal dyspnea and syncope.  Respiratory:  Positive for shortness of breath and snoring. Negative for hemoptysis.        Daytime sleepiness   Musculoskeletal:   Negative for muscle cramps and myalgias.  Gastrointestinal:  Negative for abdominal pain, constipation, diarrhea, hematemesis, hematochezia, melena, nausea and vomiting.  Neurological:  Negative for dizziness and light-headedness.   PHYSICAL EXAM: Vitals with BMI 11/29/2021 11/29/2021 11/03/2021  Height - 5' 7" -  Weight - 249 lbs -  BMI - 24.82 -  Systolic 500 370 488  Diastolic 891 694 503  Pulse 68 73 68    CONSTITUTIONAL: Well-developed and well-nourished. No acute distress.  SKIN: Skin is warm and dry. No rash noted. No cyanosis. No pallor. No jaundice HEAD: Normocephalic and atraumatic.  EYES: No scleral icterus MOUTH/THROAT: Moist oral membranes.  NECK: No JVD present. No thyromegaly noted. No carotid bruits  LYMPHATIC: No visible cervical adenopathy.  CHEST Normal respiratory effort. No intercostal retractions  LUNGS: Clear to auscultation bilaterally.  No stridor. No wheezes. No rales.  CARDIOVASCULAR: Regular rate and rhythm, positive S1-S2, no murmurs rubs or gallops appreciated. ABDOMINAL: Obese, soft, nontender, nondistended, positive bowel sounds all 4 quadrants. No apparent ascites.  EXTREMITIES: No peripheral edema, warm to touch,  HEMATOLOGIC: No significant bruising NEUROLOGIC: Oriented to person,  place, and time. Nonfocal. Normal muscle tone.  PSYCHIATRIC: Normal mood and affect. Normal behavior. Cooperative  CARDIAC DATABASE: EKG: 11/03/2021: NSR, 70 bpm, PRWP, nonspecific T-abnormality, without underlying injury pattern.  Echocardiogram: 11/2018: LVEF 40-45%,  grade 1 diastolic impairment, mild LAE, PASP 33 mmHg.   11/22/2021: Normal LV systolic function with visual EF 55-60%. Left ventricle cavity is normal in size. Moderate left ventricular hypertrophy. Normal global wall motion. Normal diastolic filling pattern, normal LAP. No significant valvular heart disease. Compared to study 11/2018 LVEF improved from 40-45% to 38-10%, diastolic function has  normalized, otherwise no significant change.  Stress Testing: Myocardial perfusion imaging study 03/31/2014: Moderate size, mild ischemia in the inferior wall extending from the base towards the apex.  Inferior wall hypokinesis, LVEF 49% scan study.  Heart Catheterization: None  CCTA  11/27/2018: Coronary artery calcium score 157 Agatston units. This places the patient in the 83rd percentile, suggesting high risk for future cardiac events.  Mild stenosis in the proximal LCx and the proximal and mid RCA. FFR 0.82 distal RCA FFR 0.85 mid LCx No evidence for hemodynamically significant stenosis.  LABORATORY DATA: CBC Latest Ref Rng & Units 02/04/2020 11/28/2018 11/26/2018  WBC 4.0 - 10.5 K/uL 5.1 5.5 5.5  Hemoglobin 13.0 - 17.0 g/dL 13.2 13.9 14.1  Hematocrit 39.0 - 52.0 % 42.9 45.6 47.0  Platelets 150 - 400 K/uL 222 201 222    CMP Latest Ref Rng & Units 11/15/2021 02/04/2020 10/17/2019  Glucose 70 - 99 mg/dL 105(H) 165(H) 110(H)  BUN 6 - 24 mg/dL _0 Creatinine 0.76 - 1.27 mg/dL 1.09 1.00 0.97  Sodium 134 - 144 mmol/L 145(H) 141 142  Potassium 3.5 - 5.2 mmol/L 4.5 3.8 4.0  Chloride 96 - 106 mmol/L 107(H) 106 100  CO2 20 - 29 mmol/L 19(L) 25 26  Calcium 8.7 - 10.2 mg/dL 9.8 9.2 9.6  Total Protein 6.5 - 8.1 g/dL - - -  Total Bilirubin 0.3 - 1.2 mg/dL - - -  Alkaline Phos 38 - 126 U/L - - -  AST 15 - 41 U/L - - -  ALT 0 - 44 IU/L - - 13    Lipid Panel  Lab Results  Component Value Date   CHOL 122 10/17/2019   HDL 36 (L) 10/17/2019   LDLCALC 54 10/17/2019   TRIG 194 (H) 10/17/2019   CHOLHDL 3.4 10/17/2019    No components found for: NTPROBNP Recent Labs    11/15/21 1154  PROBNP 36   No results for input(s): TSH in the last 8760 hours.  BMP Recent Labs    11/15/21 1154  NA 145*  K 4.5  CL 107*  CO2 19*  GLUCOSE 105*  BUN 18  CREATININE 1.09  CALCIUM 9.8    HEMOGLOBIN A1C Lab Results  Component Value Date   HGBA1C 6.4 (H) 11/26/2018   MPG 136.98  11/26/2018   External Labs:  Date Collected: 09/23/2021 , information obtained by [facility] Potassium: 4.4 Creatinine 1.01 mg/dL. eGFR: 86 mL/min per 1.73 m Hemoglobin: 14.3 g/dL and hematocrit: 45.2 % Lipid profile: Total cholesterol 209 , triglycerides 451 , HDL 34 , non-HDL 175 AST: 17 , ALT: 23 , alkaline phosphatase: 119  BNP 40 Hemoglobin A1c: 6.6 TSH: 2.21    IMPRESSION:    ICD-10-CM   1. Recovered nonischemic cardiomyopathy  I42.9 sacubitril-valsartan (ENTRESTO) 97-103 MG    carvedilol (COREG) 6.25 MG tablet    Ambulatory referral to Sleep Studies    CMP14+EGFR  2. Nonobstructive atherosclerosis of coronary artery  I25.10     3. Essential hypertension, benign  I10     4. Non-insulin dependent type 2 diabetes mellitus (HCC)  E11.9 Lipid Panel With LDL/HDL Ratio    LDL cholesterol, direct    5. Mixed hyperlipidemia  E78.2 Lipid Panel With LDL/HDL Ratio    LDL cholesterol, direct    6. Hypertriglyceridemia  E78.1     7. OSA (obstructive sleep apnea)  G47.33 sacubitril-valsartan (ENTRESTO) 97-103 MG    carvedilol (COREG) 6.25 MG tablet    Ambulatory referral to Sleep Studies    8. Class 2 severe obesity due to excess calories with serious comorbidity and body mass index (BMI) of 39.0 to 39.9 in adult [facility])  E66.01    Z68.39        RECOMMENDATIONS: Thomas Yoder is a 60 y.o. 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  Recovered nonischemic cardiomyopathy Euvolemic Not in congestive heart failure Echo: 75-10%, normal diastolic function.  Compared to 11/2018 LVEF 40-45% with grade 1 diastolic impairment. Uptitrate Entresto to 97/103 mg p.o. twice daily.  Patient's wife states that they currently have plenty of 49/51 mg  tablets.  For now I have asked him to take Entresto 49/51 mg 2 tablets twice a day until they run out.  Then they should call the office to have a 97/103 prescription filled which will be 1 tablet twice a day.  Blood work in 1 week to evaluate renal function Stop Lasix Stop hydralazine Start carvedilol 6.25 mg p.o. twice daily Plan to start Farxiga at the next office visit. Educated on importance of lifestyle changes which include glycemic control, weight loss, compliance with CPAP  Nonobstructive atherosclerosis of coronary artery Total CAC 157, 83rd percentile  Mild stenosis within the RCA/LCx.  Not hemodynamically significant per CT FFR. Denies angina pectoris  Continue aspirin and statin therapy  Will check fasting lipid profile Continue to address modifiable cardiovascular risk factors.   If patient continues to have dyspnea on exertion despite better blood pressure management and weight loss recommend ischemic evaluation.  However, both patient and wife would like to hold off for now which is reasonable as his symptoms are currently improving.    Essential hypertension, benign Not well controlled. Medications adjustment as noted above Reemphasized the importance of a low-salt diet. Is a systolic blood pressures are consistently greater than 130 mmHg I have asked the patient's wife to call the office for further evaluation.  Non-insulin dependent type 2 diabetes mellitus ([place]) Continue Arni, statin therapy Glycemic management per primary team Consider Farxiga/Jardiance  Mixed hyperlipidemia Currently on atorvastatin 20 mg p.o. nightly. Check fasting lipid profile.  I suspect that his triglycerides may still be elevated.  Hypertriglyceridemia Currently on fenofibrate. Checking fasting lipid profile as discussed above  OSA (obstructive sleep apnea) Referred to Dr. Nehemiah Settle for sleep apnea evaluation.  Prior history of OSA currently not on CPAP.  Class 2 severe obesity  due to excess calories with serious comorbidity and body mass index (BMI) of 39.0 to 39.9 in adult [facility]) Body mass index is 39 kg/m. I reviewed with the patient the importance of diet, regular physical activity/exercise, weight loss.   Patient is educated on increasing physical activity gradually as tolerated.  With the goal of moderate intensity exercise for 30 minutes a day 5 days  a week.   FINAL MEDICATION LIST END OF ENCOUNTER: Meds ordered this encounter  Medications   sacubitril-valsartan (ENTRESTO) 97-103 MG    Sig: Take 1 tablet by mouth 2 (two) times daily.    Dispense:  180 tablet    Refill:  0   carvedilol (COREG) 6.25 MG tablet    Sig: Take 1 tablet (6.25 mg total) by mouth 2 (two) times daily.    Dispense:  180 tablet    Refill:  0     Medications Discontinued During This Encounter  Medication Reason   omeprazole (PRILOSEC) 20 MG capsule    omeprazole (PRILOSEC) 20 MG capsule    sildenafil (VIAGRA) 100 MG tablet    furosemide (LASIX) 20 MG tablet Discontinued by provider   sacubitril-valsartan (ENTRESTO) 49-51 MG Dose change   hydrALAZINE (APRESOLINE) 25 MG tablet Dose change     Current Outpatient Medications:    aspirin EC 81 MG EC tablet, Take 1 tablet (81 mg total) by mouth daily., Disp: , Rfl:    atorvastatin (LIPITOR) 20 MG tablet, Take 1 tablet (20 mg total) by mouth daily., Disp: 90 tablet, Rfl: 1   Blood Pressure Monitoring (ADULT BLOOD PRESSURE CUFF LG) KIT, Check blood pressure daily and record readings., Disp: 1 each, Rfl: 0   Blood Pressure Monitoring (BLOOD PRESSURE KIT) DEVI, 1 Device by Does not apply route daily., Disp: 1 Device, Rfl: 0   carvedilol (COREG) 6.25 MG tablet, Take 1 tablet (6.25 mg total) by mouth 2 (two) times daily., Disp: 180 tablet, Rfl: 0   fenofibrate (TRICOR) 145 MG tablet, Take 1 tablet (145 mg total) by mouth daily., Disp: 90 tablet, Rfl: 1   metFORMIN (GLUCOPHAGE) 500 MG tablet, Take 1 tablet (500 mg total) by mouth 2 (two)  times daily., Disp: 180 tablet, Rfl: 1   methocarbamol (ROBAXIN) 500 MG tablet, Take 2 tablets (1,000 mg total) by mouth 4 (four) times daily as needed., Disp: 60 tablet, Rfl: 0   methylPREDNISolone (MEDROL) 4 MG TBPK tablet, Take as directed, Disp: 21 tablet, Rfl: 0   sacubitril-valsartan (ENTRESTO) 97-103 MG, Take 1 tablet by mouth 2 (two) times daily., Disp: 180 tablet, Rfl: 0  Orders Placed This Encounter  Procedures   CMP14+EGFR   Lipid Panel With LDL/HDL Ratio   LDL cholesterol, direct   Ambulatory referral to Sleep Studies    There are no Patient Instructions on file for this visit.   --Continue cardiac medications as reconciled in final medication list. --Return in about 3 months (around 02/27/2022) for Follow up history of recovered cardiomyopathy. Or sooner if needed. --Continue follow-up with your primary care physician regarding the management of your other chronic comorbid conditions.  Patient's questions and concerns were addressed to his satisfaction. He voices understanding of the instructions provided during this encounter.   This note was created using a voice recognition software as a result there may be grammatical errors inadvertently enclosed that do not reflect the nature of this encounter. Every attempt is made to correct such errors.  Rex Kras, [place], [facility]  Pager: [phone] Office: [phone]

## 2021-11-29 NOTE — Progress Notes (Signed)
Patient came in for an appt to review appt

## 2021-12-27 DIAGNOSIS — E119 Type 2 diabetes mellitus without complications: Secondary | ICD-10-CM | POA: Diagnosis not present

## 2021-12-27 DIAGNOSIS — E782 Mixed hyperlipidemia: Secondary | ICD-10-CM | POA: Diagnosis not present

## 2021-12-27 DIAGNOSIS — I429 Cardiomyopathy, unspecified: Secondary | ICD-10-CM | POA: Diagnosis not present

## 2021-12-28 LAB — CMP14+EGFR
ALT: 22 IU/L (ref 0–44)
AST: 18 IU/L (ref 0–40)
Albumin/Globulin Ratio: 1.9 (ref 1.2–2.2)
Albumin: 4.6 g/dL (ref 3.8–4.9)
Alkaline Phosphatase: 104 IU/L (ref 44–121)
BUN/Creatinine Ratio: 12 (ref 9–20)
BUN: 14 mg/dL (ref 6–24)
Bilirubin Total: <0.2 mg/dL (ref 0.0–1.2)
CO2: 24 mmol/L (ref 20–29)
Calcium: 10.1 mg/dL (ref 8.7–10.2)
Chloride: 105 mmol/L (ref 96–106)
Creatinine, Ser: 1.15 mg/dL (ref 0.76–1.27)
Globulin, Total: 2.4 g/dL (ref 1.5–4.5)
Glucose: 110 mg/dL — ABNORMAL HIGH (ref 70–99)
Potassium: 4.5 mmol/L (ref 3.5–5.2)
Sodium: 144 mmol/L (ref 134–144)
Total Protein: 7 g/dL (ref 6.0–8.5)
eGFR: 73 mL/min/{1.73_m2} (ref >59)

## 2021-12-28 LAB — LIPID PANEL WITH LDL/HDL RATIO
Cholesterol, Total: 163 mg/dL (ref 100–199)
HDL: 39 mg/dL — ABNORMAL LOW (ref >39)
LDL Chol Calc (NIH): 95 mg/dL (ref 0–99)
LDL/HDL Ratio: 2.4 ratio (ref 0.0–3.6)
Triglycerides: 169 mg/dL — ABNORMAL HIGH (ref 0–149)
VLDL Cholesterol Cal: 29 mg/dL (ref 5–40)

## 2021-12-28 LAB — LDL CHOLESTEROL, DIRECT: LDL Direct: 96 mg/dL (ref 0–99)

## 2021-12-31 ENCOUNTER — Other Ambulatory Visit (HOSPITAL_COMMUNITY): Payer: Self-pay

## 2021-12-31 ENCOUNTER — Other Ambulatory Visit: Payer: Self-pay

## 2021-12-31 DIAGNOSIS — G4733 Obstructive sleep apnea (adult) (pediatric): Secondary | ICD-10-CM

## 2021-12-31 DIAGNOSIS — I429 Cardiomyopathy, unspecified: Secondary | ICD-10-CM

## 2021-12-31 MED ORDER — ENTRESTO 97-103 MG PO TABS
1.0000 | ORAL_TABLET | Freq: Two times a day (BID) | ORAL | 0 refills | Status: DC
  Filled 2021-12-31: qty 180, 90d supply, fill #0

## 2022-01-04 DIAGNOSIS — I10 Essential (primary) hypertension: Secondary | ICD-10-CM | POA: Diagnosis not present

## 2022-02-17 ENCOUNTER — Other Ambulatory Visit (HOSPITAL_COMMUNITY): Payer: Self-pay

## 2022-02-28 ENCOUNTER — Ambulatory Visit: Payer: 59 | Admitting: Cardiology

## 2022-02-28 ENCOUNTER — Other Ambulatory Visit: Payer: Self-pay

## 2022-02-28 ENCOUNTER — Other Ambulatory Visit (HOSPITAL_COMMUNITY): Payer: Self-pay

## 2022-02-28 ENCOUNTER — Encounter: Payer: Self-pay | Admitting: Cardiology

## 2022-02-28 VITALS — BP 170/98 | HR 74 | Temp 98.8°F | Resp 17 | Ht 67.0 in | Wt 250.0 lb

## 2022-02-28 DIAGNOSIS — I429 Cardiomyopathy, unspecified: Secondary | ICD-10-CM

## 2022-02-28 DIAGNOSIS — I10 Essential (primary) hypertension: Secondary | ICD-10-CM

## 2022-02-28 DIAGNOSIS — E66812 Obesity, class 2: Secondary | ICD-10-CM

## 2022-02-28 DIAGNOSIS — E782 Mixed hyperlipidemia: Secondary | ICD-10-CM

## 2022-02-28 DIAGNOSIS — I2510 Atherosclerotic heart disease of native coronary artery without angina pectoris: Secondary | ICD-10-CM

## 2022-02-28 DIAGNOSIS — G4733 Obstructive sleep apnea (adult) (pediatric): Secondary | ICD-10-CM | POA: Diagnosis not present

## 2022-02-28 DIAGNOSIS — E781 Pure hyperglyceridemia: Secondary | ICD-10-CM

## 2022-02-28 DIAGNOSIS — E119 Type 2 diabetes mellitus without complications: Secondary | ICD-10-CM

## 2022-02-28 DIAGNOSIS — Z6839 Body mass index (BMI) 39.0-39.9, adult: Secondary | ICD-10-CM | POA: Diagnosis not present

## 2022-02-28 MED ORDER — ISOSORB DINITRATE-HYDRALAZINE 20-37.5 MG PO TABS
1.0000 | ORAL_TABLET | Freq: Three times a day (TID) | ORAL | 0 refills | Status: DC
  Filled 2022-02-28: qty 270, 90d supply, fill #0

## 2022-02-28 MED ORDER — CARVEDILOL 12.5 MG PO TABS
12.5000 mg | ORAL_TABLET | Freq: Two times a day (BID) | ORAL | 0 refills | Status: DC
  Filled 2022-02-28: qty 60, 30d supply, fill #0

## 2022-02-28 NOTE — Progress Notes (Signed)
Date:  02/28/2022   ID:  Thomas Yoder, DOB September 02, 1961, MRN [id number]  PCP:  Everardo Beals, NP  Cardiologist:  Rex Kras, DO, [facility]  (established care 11/03/2021) Former Cardiology Providers: Dr. Fransico Him   Date: 02/28/22 Last Office Visit: 11/29/2021  Chief Complaint  Patient presents with   Follow-up    3 month History of recovered cardiomyopathy. Shortness of breath.      HPI  Thomas Yoder is a 61 y.o. African-American male whose past medical history and cardiovascular risk factors include: Diabetes, hypertension, intermittent noncompliance, tobacco use, snoring, recovered nonischemic cardiomyopathy, nonobstructive CAD by CT 11/2018, former smoker, noncompliance, obesity due to excess calories.  This patient is accompanied in the office by his spouse (Marjette on speaker phone). Thomas Yoder provides verbal consent with regards to having her present during today's encounter.  Was under the care of Dr. Golden Hurter at [facility] and now is referred back to our practice as of 10/2021 for management of shortness of breath.   Patient has a known history of nonischemic cardiomyopathy and since establishing care he underwent an echocardiogram recently which notes improvement in LVEF from 40-45% to 55-60%.  During prior office visit we have discontinued lisinopril and amlodipine and transition him to Entresto 97/103 mg p.o. twice daily.  He presents today for 36-monthfollow-up visit.  He complains of shortness of breath with effort related activities.  He still tries to ambulate about 29,000 steps per day between his 2 jobs and his day-to-day activities.  Both office and her blood pressures are not well controlled and patient is requesting assistance for further medication titration.  At the last office visits he was provided a referral to sleep medicine but still has not been seen in consultation.  FUNCTIONAL STATUS: No structured exercise program or daily routine. But active  with working two jobs and claims to walk 35,000 steps.    ALLERGIES: No Known Allergies  MEDICATION LIST PRIOR TO VISIT: Current Meds  Medication Sig   aspirin EC 81 MG EC tablet Take 1 tablet (81 mg total) by mouth daily.   atorvastatin (LIPITOR) 20 MG tablet Take 1 tablet (20 mg total) by mouth daily.   Blood Pressure Monitoring (ADULT BLOOD PRESSURE CUFF LG) KIT Check blood pressure daily and record readings.   Blood Pressure Monitoring (BLOOD PRESSURE KIT) DEVI 1 Device by Does not apply route daily.   carvedilol (COREG) 12.5 MG tablet Take 1 tablet (12.5 mg total) by mouth 2 (two) times daily.   fenofibrate (TRICOR) 145 MG tablet Take 1 tablet (145 mg total) by mouth daily.   isosorbide-hydrALAZINE (BIDIL) 20-37.5 MG tablet Take 1 tablet by mouth 3 (three) times daily.   metFORMIN (GLUCOPHAGE) 500 MG tablet Take 1 tablet (500 mg total) by mouth 2 (two) times daily.   omeprazole (PRILOSEC) 20 MG capsule Take 20 mg by mouth daily.   sacubitril-valsartan (ENTRESTO) 97-103 MG Take 1 tablet by mouth 2 (two) times daily.   [DISCONTINUED] carvedilol (COREG) 6.25 MG tablet Take 1 tablet (6.25 mg total) by mouth 2 (two) times daily.     PAST MEDICAL HISTORY: Past Medical History:  Diagnosis Date   CAD (coronary artery disease)    a. Cardiac CT showed calcium score of 83rd percentile, mild stenosis in the proximal LCx and the proximal and mid RCA, negative FFR for hemodynamic significance.   Chest pain    Chronic combined systolic and diastolic CHF (congestive heart failure) ([place] 12/11/2018   Depression  Diabetes mellitus without complication ([place])    Former tobacco use    H/O noncompliance with medical treatment, presenting hazards to health    Hyperlipidemia    Hypertension    NICM (nonischemic cardiomyopathy) ([place])    a. EF 40-45% by echo 11/2018.   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  SOCIAL HISTORY:  The patient  reports that he quit smoking about 4 years ago. His smoking use included cigarettes. He has a 17.00 pack-year smoking history. He has never used smokeless tobacco. He reports that he does not currently use drugs after having used the following drugs: Marijuana. He reports that he does not drink alcohol.  REVIEW OF SYSTEMS: Review of Systems  Cardiovascular:  Negative for chest pain, dyspnea on exertion, leg swelling, palpitations and syncope.  Respiratory:  Positive for shortness of breath and snoring.    PHYSICAL EXAM: Vitals with BMI 02/28/2022 02/28/2022 11/29/2021  Height - '5\' 7"'  -  Weight - 250 lbs -  BMI - 01.65 -  Systolic 537 482 707  Diastolic 98 867 544  Pulse 74 74 68    CONSTITUTIONAL: Well-developed and well-nourished. No acute distress.  SKIN: Skin is warm and dry. No rash noted. No cyanosis. No pallor. No jaundice HEAD: Normocephalic and atraumatic.  EYES: No scleral icterus MOUTH/THROAT: Moist oral membranes.  NECK: No JVD present. No thyromegaly noted. No carotid bruits  LYMPHATIC: No visible cervical adenopathy.  CHEST Normal respiratory effort. No intercostal retractions  LUNGS: Clear to auscultation bilaterally.  No stridor. No wheezes. No rales.  CARDIOVASCULAR: Regular rate and rhythm, positive S1-S2, no murmurs rubs or gallops appreciated. ABDOMINAL: Obese, soft, nontender, nondistended, positive bowel sounds all 4 quadrants. No apparent ascites.  EXTREMITIES: No peripheral edema, warm to touch,  HEMATOLOGIC: No significant bruising NEUROLOGIC: Oriented to person, place, and  time. Nonfocal. Normal muscle tone.  PSYCHIATRIC: Normal mood and affect. Normal behavior. Cooperative  CARDIAC DATABASE: EKG: 11/03/2021: NSR, 70 bpm, PRWP, nonspecific T-abnormality, without underlying injury pattern.  Echocardiogram: 11/2018: LVEF 40-45%,  grade 1 diastolic impairment, mild LAE, PASP 33 mmHg.   11/22/2021: LVEF 55-60%, moderate LVH, no significant valvular heart disease.  Stress Testing: Myocardial perfusion imaging study 03/31/2014: Moderate size, mild ischemia in the inferior wall extending from the base towards the apex.  Inferior wall hypokinesis, LVEF 49% scan study.  Heart Catheterization: None  CCTA  11/27/2018: Coronary artery calcium score 157 Agatston units. This places the patient in the 83rd percentile, suggesting high risk for future cardiac events.  Mild stenosis in the proximal LCx and the proximal and mid RCA. FFR 0.82 distal RCA FFR 0.85 mid LCx No evidence for hemodynamically significant stenosis.  LABORATORY DATA: CBC Latest Ref Rng & Units 02/04/2020 11/28/2018 11/26/2018  WBC 4.0 - 10.5 K/uL 5.1 5.5 5.5  Hemoglobin 13.0 - 17.0 g/dL 13.2 13.9 14.1  Hematocrit 39.0 - 52.0 % 42.9 45.6 47.0  Platelets 150 - 400 K/uL 222 201 222    CMP Latest Ref Rng & Units 12/27/2021 11/15/2021 02/04/2020  Glucose 70 - 99 mg/dL 110(H) 105(H) 165(H)  BUN 6 - 24 mg/dL '14 18 16  ' Creatinine 0.76 - 1.27 mg/dL 1.15 1.09 1.00  Sodium 134 - 144 mmol/L 144 145(H) 141  Potassium 3.5 - 5.2 mmol/L 4.5 4.5 3.8  Chloride 96 - 106 mmol/L 105 107(H) 106  CO2 20 - 29 mmol/L 24 19(L) 25  Calcium 8.7 - 10.2 mg/dL 10.1 9.8 9.2  Total Protein 6.0 - 8.5 g/dL 7.0 - -  Total Bilirubin 0.0 - 1.2 mg/dL <0.2 - -  Alkaline Phos 44 - 121 IU/L 104 - -  AST 0 - 40 IU/L 18 - -  ALT 0 - 44 IU/L 22 - -    Lipid Panel  Lab Results  Component Value Date   CHOL 163 12/27/2021   HDL 39 (L) 12/27/2021   LDLCALC 95 12/27/2021   LDLDIRECT 96 12/27/2021   TRIG 169 (H) 12/27/2021    CHOLHDL 3.4 10/17/2019    No components found for: NTPROBNP Recent Labs    11/15/21 1154  PROBNP 36   No results for input(s): TSH in the last 8760 hours.  BMP Recent Labs    11/15/21 1154 12/27/21 1027  NA 145* 144  K 4.5 4.5  CL 107* 105  CO2 19* 24  GLUCOSE 105* 110*  BUN 18 14  CREATININE 1.09 1.15  CALCIUM 9.8 10.1    HEMOGLOBIN A1C Lab Results  Component Value Date   HGBA1C 6.4 (H) 11/26/2018   MPG 136.98 11/26/2018   External Labs:  Date Collected: 09/23/2021 , information obtained by [facility] Potassium: 4.4 Creatinine 1.01 mg/dL. eGFR: 86 mL/min per 1.73 m Hemoglobin: 14.3 g/dL and hematocrit: 45.2 % Lipid profile: Total cholesterol 209 , triglycerides 451 , HDL 34 , non-HDL 175 AST: 17 , ALT: 23 , alkaline phosphatase: 119  BNP 40 Hemoglobin A1c: 6.6 TSH: 2.21    IMPRESSION:    ICD-10-CM   1. Recovered cardiomyopathy  I42.9 isosorbide-hydrALAZINE (BIDIL) 20-37.5 MG tablet    carvedilol (COREG) 12.5 MG tablet    2. Essential hypertension, benign  I10 isosorbide-hydrALAZINE (BIDIL) 20-37.5 MG tablet    carvedilol (COREG) 12.5 MG tablet    3. Nonobstructive atherosclerosis of coronary artery  I25.10     4. Non-insulin dependent type 2 diabetes mellitus ([place])  E11.9     5. Mixed hyperlipidemia  E78.2     6. Hypertriglyceridemia  E78.1     7. OSA (obstructive sleep apnea)  G47.33     8. Class 2 severe obesity due to excess calories with serious comorbidity and body mass index (BMI) of 39.0 to 39.9 in adult [facility])  E66.01    Z68.39        RECOMMENDATIONS: Atari Novick is a 61 y.o. 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  Recovered cardiomyopathy Stage C, NYHA class II/III Clinically  euvolemic Echo: 48-25%, normal diastolic function, no significant valvular heart disease. Patient requesting assistance with blood pressure management. Continue Entresto. Start BiDil 20/37.5 mg 1 tablet 3 times daily. Increase carvedilol to 12.5 mg p.o. twice daily Consider starting Farxiga at the next office visit. Reemphasized importance of being evaluated for sleep apnea. We emphasized the importance of a low-salt diet, heart healthy lifestyle, glycemic control, lipid management.  Essential hypertension, benign Patient is requesting assistance with better blood pressure management. Office and home  blood pressures are not well controlled. Medication changes as discussed above. We will continue to monitor.   Patient and his wife are instructed to go to the emergency room if he has new onset of vision changes, focal neurological deficits, pain between the shoulder blades, or change in clinical status compared to his baseline as uncontrolled hypertension can lead to worsening morbidity mortality affecting the brain, heart, and kidneys along with other organ involvements.  They verbalized understanding.  Nonobstructive atherosclerosis of coronary artery Total CAC 157, 83rd percentile. Mild stenosis within the LCx/RCA and not hemodynamically significant per CT FFR. Denies angina pectoris. Continue aspirin and statin therapy.   Patient needs a repeat fasting lipid profile-she plans to have it done with PCP. I suspect that his dyspnea on exertion is likely due to uncontrolled hypertension as opposed to worsening obstructive CAD.  However if the symptoms continue with improvement in blood pressure additional testing can be warranted at that time.  Patient and wife are agreeable with the plan of care.  Non-insulin dependent type 2 diabetes mellitus ([place]) Currently on Arni, statin therapy. Reemphasized importance of glycemic control. We will consider Farxiga at the next office visit.  Mixed  hyperlipidemia Currently on atorvastatin.   He denies myalgia or other side effects.  Hypertriglyceridemia Currently on fenofibrate. Currently managed by primary care provider.  OSA (obstructive sleep apnea) Patient was given a referral to Dr. Nehemiah Settle during last office visit. Patient states that the consult still has not been completed.  We will have the front desk reach out to Dr. Patton Salles office and have also asked the patient to call either our office or Dr. Patton Salles office if he does not hear back within the next week.  Class 2 severe obesity due to excess calories with serious comorbidity and body mass index (BMI) of 39.0 to 39.9 in adult [facility]) Body mass index is 39.16 kg/m. I reviewed with the patient the importance of diet, regular physical activity/exercise, weight loss.   Patient is educated on increasing physical activity gradually as tolerated.  With the goal of moderate intensity exercise for 30 minutes a day 5 days a week.  FINAL MEDICATION LIST END OF ENCOUNTER: Meds ordered this encounter  Medications   isosorbide-hydrALAZINE (BIDIL) 20-37.5 MG tablet    Sig: Take 1 tablet by mouth 3 (three) times daily.    Dispense:  270 tablet    Refill:  0   carvedilol (COREG) 12.5 MG tablet    Sig: Take 1 tablet (12.5 mg total) by mouth 2 (two) times daily.    Dispense:  60 tablet    Refill:  0     Medications Discontinued During This Encounter  Medication Reason   methylPREDNISolone (MEDROL) 4 MG TBPK tablet    methocarbamol (ROBAXIN) 500 MG tablet    carvedilol (COREG) 6.25 MG tablet Dose change     Current Outpatient Medications:    aspirin EC 81 MG EC tablet, Take 1 tablet (81 mg total) by mouth daily., Disp: , Rfl:    atorvastatin (LIPITOR) 20 MG tablet, Take 1 tablet (20 mg total) by mouth daily., Disp: 90 tablet, Rfl: 1   Blood Pressure Monitoring (ADULT BLOOD PRESSURE CUFF LG) KIT, Check blood pressure daily and record readings., Disp: 1 each, Rfl: 0   Blood  Pressure Monitoring (BLOOD PRESSURE KIT) DEVI, 1 Device by Does not apply route daily., Disp: 1 Device, Rfl: 0   carvedilol (COREG) 12.5 MG tablet, Take 1 tablet (12.5 mg total) by mouth 2 (  two) times daily., Disp: 60 tablet, Rfl: 0   fenofibrate (TRICOR) 145 MG tablet, Take 1 tablet (145 mg total) by mouth daily., Disp: 90 tablet, Rfl: 1   isosorbide-hydrALAZINE (BIDIL) 20-37.5 MG tablet, Take 1 tablet by mouth 3 (three) times daily., Disp: 270 tablet, Rfl: 0   metFORMIN (GLUCOPHAGE) 500 MG tablet, Take 1 tablet (500 mg total) by mouth 2 (two) times daily., Disp: 180 tablet, Rfl: 1   omeprazole (PRILOSEC) 20 MG capsule, Take 20 mg by mouth daily., Disp: , Rfl:    sacubitril-valsartan (ENTRESTO) 97-103 MG, Take 1 tablet by mouth 2 (two) times daily., Disp: 180 tablet, Rfl: 0  No orders of the defined types were placed in this encounter.   There are no Patient Instructions on file for this visit.   --Continue cardiac medications as reconciled in final medication list. --Return in about 4 weeks (around 03/28/2022) for Follow up recovered cmp and BP. Or sooner if needed. --Continue follow-up with your primary care physician regarding the management of your other chronic comorbid conditions.  Patient's questions and concerns were addressed to his satisfaction. He voices understanding of the instructions provided during this encounter.   This note was created using a voice recognition software as a result there may be grammatical errors inadvertently enclosed that do not reflect the nature of this encounter. Every attempt is made to correct such errors.  Rex Kras, [place], [facility]  Pager: [phone] Office: [phone]

## 2022-03-01 ENCOUNTER — Other Ambulatory Visit (HOSPITAL_COMMUNITY): Payer: Self-pay

## 2022-03-03 DIAGNOSIS — I10 Essential (primary) hypertension: Secondary | ICD-10-CM | POA: Diagnosis not present

## 2022-03-03 DIAGNOSIS — Z1211 Encounter for screening for malignant neoplasm of colon: Secondary | ICD-10-CM | POA: Diagnosis not present

## 2022-03-03 DIAGNOSIS — E119 Type 2 diabetes mellitus without complications: Secondary | ICD-10-CM | POA: Diagnosis not present

## 2022-03-03 DIAGNOSIS — E785 Hyperlipidemia, unspecified: Secondary | ICD-10-CM | POA: Diagnosis not present

## 2022-03-03 DIAGNOSIS — R638 Other symptoms and signs concerning food and fluid intake: Secondary | ICD-10-CM | POA: Diagnosis not present

## 2022-03-07 ENCOUNTER — Other Ambulatory Visit: Payer: Self-pay

## 2022-03-07 ENCOUNTER — Other Ambulatory Visit (HOSPITAL_COMMUNITY): Payer: Self-pay

## 2022-03-07 MED ORDER — JARDIANCE 10 MG PO TABS
10.0000 mg | ORAL_TABLET | Freq: Every day | ORAL | 1 refills | Status: DC
  Filled 2022-03-07: qty 90, 90d supply, fill #0
  Filled 2022-06-10: qty 90, 90d supply, fill #1

## 2022-03-09 DIAGNOSIS — I428 Other cardiomyopathies: Secondary | ICD-10-CM | POA: Diagnosis not present

## 2022-03-09 DIAGNOSIS — I159 Secondary hypertension, unspecified: Secondary | ICD-10-CM | POA: Diagnosis not present

## 2022-03-09 DIAGNOSIS — I10 Essential (primary) hypertension: Secondary | ICD-10-CM | POA: Diagnosis not present

## 2022-03-09 DIAGNOSIS — G4733 Obstructive sleep apnea (adult) (pediatric): Secondary | ICD-10-CM | POA: Diagnosis not present

## 2022-03-09 DIAGNOSIS — I5042 Chronic combined systolic (congestive) and diastolic (congestive) heart failure: Secondary | ICD-10-CM | POA: Diagnosis not present

## 2022-03-31 ENCOUNTER — Other Ambulatory Visit (HOSPITAL_COMMUNITY): Payer: Self-pay

## 2022-03-31 ENCOUNTER — Other Ambulatory Visit: Payer: Self-pay | Admitting: Cardiology

## 2022-03-31 DIAGNOSIS — I429 Cardiomyopathy, unspecified: Secondary | ICD-10-CM

## 2022-03-31 DIAGNOSIS — I10 Essential (primary) hypertension: Secondary | ICD-10-CM

## 2022-04-01 ENCOUNTER — Other Ambulatory Visit (HOSPITAL_COMMUNITY): Payer: Self-pay

## 2022-04-01 MED ORDER — CARVEDILOL 12.5 MG PO TABS
12.5000 mg | ORAL_TABLET | Freq: Two times a day (BID) | ORAL | 0 refills | Status: DC
  Filled 2022-04-01: qty 60, 30d supply, fill #0

## 2022-04-04 ENCOUNTER — Other Ambulatory Visit (HOSPITAL_COMMUNITY): Payer: Self-pay

## 2022-04-04 ENCOUNTER — Encounter: Payer: Self-pay | Admitting: Cardiology

## 2022-04-04 ENCOUNTER — Ambulatory Visit: Payer: 59 | Admitting: Cardiology

## 2022-04-04 VITALS — BP 147/88 | HR 71 | Temp 98.0°F | Resp 16 | Ht 67.0 in | Wt 245.0 lb

## 2022-04-04 DIAGNOSIS — I429 Cardiomyopathy, unspecified: Secondary | ICD-10-CM

## 2022-04-04 DIAGNOSIS — E781 Pure hyperglyceridemia: Secondary | ICD-10-CM | POA: Diagnosis not present

## 2022-04-04 DIAGNOSIS — G4733 Obstructive sleep apnea (adult) (pediatric): Secondary | ICD-10-CM

## 2022-04-04 DIAGNOSIS — E119 Type 2 diabetes mellitus without complications: Secondary | ICD-10-CM

## 2022-04-04 DIAGNOSIS — Z6838 Body mass index (BMI) 38.0-38.9, adult: Secondary | ICD-10-CM | POA: Diagnosis not present

## 2022-04-04 DIAGNOSIS — I2510 Atherosclerotic heart disease of native coronary artery without angina pectoris: Secondary | ICD-10-CM

## 2022-04-04 DIAGNOSIS — I10 Essential (primary) hypertension: Secondary | ICD-10-CM

## 2022-04-04 DIAGNOSIS — E782 Mixed hyperlipidemia: Secondary | ICD-10-CM | POA: Diagnosis not present

## 2022-04-04 MED ORDER — CARVEDILOL 25 MG PO TABS
25.0000 mg | ORAL_TABLET | Freq: Two times a day (BID) | ORAL | 0 refills | Status: DC
  Filled 2022-04-04: qty 180, 90d supply, fill #0

## 2022-04-04 MED ORDER — ATORVASTATIN CALCIUM 20 MG PO TABS
20.0000 mg | ORAL_TABLET | Freq: Every day | ORAL | 1 refills | Status: DC
  Filled 2022-04-04: qty 90, 90d supply, fill #0
  Filled 2022-07-12: qty 90, 90d supply, fill #1

## 2022-04-04 MED ORDER — SPIRONOLACTONE 25 MG PO TABS
25.0000 mg | ORAL_TABLET | Freq: Every morning | ORAL | 0 refills | Status: DC
  Filled 2022-04-04: qty 90, 90d supply, fill #0

## 2022-04-04 NOTE — Progress Notes (Signed)
? ?Date:  04/04/2022  ? ?ID:  Thomas Yoder, DOB 17-Nov-1961, MRN [id number] ? ?PCP:  Everardo Beals, NP  ?Cardiologist:  Rex Kras, DO, [facility]  (established care 11/03/2021) ?Former Cardiology Providers: Dr. Fransico Him  ? ?Date: 04/04/22 ?Last Office Visit: 02/28/2022 ? ?Chief Complaint  ?Patient presents with  ? Congestive Heart Failure  ? Follow-up  ?  4 Weeks  ? ? ?HPI  ?Thomas Yoder is a 61 y.o. African-American male whose past medical history and cardiovascular risk factors include: Diabetes, hypertension, intermittent noncompliance, tobacco use, snoring, recovered nonischemic cardiomyopathy, nonobstructive CAD by CT 11/2018, former smoker, noncompliance, obesity due to excess calories. ? ?Transitioning his care from Dr. Theodosia Blender office at [facility] to our practice as of November 2022 given his history of congestive heart failure/recovered cardiomyopathy/shortness of breath.   ? ?He has known history of nonischemic cardiomyopathy and used to have an LVEF of 40-45% which has improved with medical therapy and recent echo notes LVEF to be 55-60%.  Since last office visit we have uptitrated his GDMT in a stepwise fashion and his blood pressures and shortness of breath have improved markedly.  He is educated on the importance of improving his modifiable cardiovascular risk factors. ? ?Since last office visit patient has lost approximately 5 pounds for which she is congratulated for today's office visit.  His LDL levels have also improved compared to the past but currently not at goal given his history of diabetes.  Patient was unable to tolerate BiDil due to severe headaches.  His home blood pressures have also improved on current medical therapy but not at goal (now ranging between 140-155 mmHg). ? ?At the last office visits he was provided a referral to sleep medicine but still has not been seen in consultation. ? ?FUNCTIONAL STATUS: ?No structured exercise program or daily routine. But active with working two jobs  and claims to walk 35,000 steps.  ?  ?ALLERGIES: ?No Known Allergies ? ?MEDICATION LIST PRIOR TO VISIT: ?Current Meds  ?Medication Sig  ? aspirin EC 81 MG EC tablet Take 1 tablet (81 mg total) by mouth daily.  ? Blood Pressure Monitoring (ADULT BLOOD PRESSURE CUFF LG) KIT Check blood pressure daily and record readings.  ? Blood Pressure Monitoring (BLOOD PRESSURE KIT) DEVI 1 Device by Does not apply route daily.  ? empagliflozin (JARDIANCE) 10 MG TABS tablet Take 1 tablet (10 mg total) by mouth daily.  ? fenofibrate (TRICOR) 145 MG tablet Take 1 tablet (145 mg total) by mouth daily.  ? metFORMIN (GLUCOPHAGE) 500 MG tablet Take 1 tablet (500 mg total) by mouth 2 (two) times daily.  ? omeprazole (PRILOSEC) 20 MG capsule Take 20 mg by mouth daily.  ? sacubitril-valsartan (ENTRESTO) 97-103 MG 1 tablet  ? spironolactone (ALDACTONE) 25 MG tablet Take 1 tablet (25 mg total) by mouth every morning.  ? [DISCONTINUED] atorvastatin (LIPITOR) 20 MG tablet Take 1 tablet (20 mg total) by mouth daily.  ? [DISCONTINUED] carvedilol (COREG) 12.5 MG tablet Take 1 tablet (12.5 mg total) by mouth 2 (two) times daily.  ?  ? ?PAST MEDICAL HISTORY: ?Past Medical History:  ?Diagnosis Date  ? CAD (coronary artery disease)   ? a. Cardiac CT showed calcium score of 83rd percentile, mild stenosis in the proximal LCx and the proximal and mid RCA, negative FFR for hemodynamic significance.  ? Chest pain   ? Chronic combined systolic and diastolic CHF (congestive heart failure) ([place]) 12/11/2018  ? Depression   ? Diabetes mellitus without complication ([place])   ?  Former tobacco use   ? H/O noncompliance with medical treatment, presenting hazards to health   ? Hyperlipidemia   ? Hypertension   ? NICM (nonischemic cardiomyopathy) ([place])   ? a. EF 40-45% by echo 11/2018.  ? Obesity (BMI 30-39.9)   ? OSA (obstructive sleep apnea) 07/24/2019  ? Snoring   ? ? ?PAST SURGICAL HISTORY: ?Past Surgical History:  ?Procedure Laterality Date  ? JOINT REPLACEMENT    ?  KNEE SURGERY    ? 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 Procedure: TOTAL KNEE ARTHROPLASTY;  Surgeon: Melrose Nakayama, MD;  Location: [place];  Service: Orthopedics;  Laterality: Left;  ? ? ?FAMILY HISTORY: ?The patient family history includes Diabetes in his mother; Hypertension in his father and mother. ? ?SOCIAL HISTORY:  ?The patient  reports that he quit smoking about 4 years ago. His smoking use included cigarettes. He has a 17.00 pack-year smoking history. He has never used smokeless tobacco. He reports that he does not currently use drugs after having used the following drugs: Marijuana. He reports that he does not drink alcohol. ? ?REVIEW OF SYSTEMS: ?Review of Systems  ?Cardiovascular:  Negative for chest pain, dyspnea on exertion, leg swelling, palpitations and syncope.  ?Respiratory:  Positive for shortness of breath and snoring.   ? ?PHYSICAL EXAM: ? ?  04/04/2022  ? 10:50 AM 02/28/2022  ?  2:21 PM 02/28/2022  ?  2:20 PM  ?Vitals with BMI  ?Height _0   _1   ?Weight 245 lbs  250 lbs  ?BMI 38.36  39.15  ?Systolic 425 956 387  ?Diastolic 88 98 564  ?Pulse 71 74 74  ? ? ?CONSTITUTIONAL: Well-developed and well-nourished. No acute distress.  ?SKIN: Skin is warm and dry. No rash noted. No cyanosis. No pallor. No jaundice ?HEAD: Normocephalic and atraumatic.  ?EYES: No scleral icterus ?MOUTH/THROAT: Moist oral membranes.  ?NECK: No JVD present. No thyromegaly noted. No carotid bruits  ?LYMPHATIC: No visible cervical adenopathy.  ?CHEST Normal respiratory effort. No intercostal retractions  ?LUNGS: Clear to auscultation bilaterally.  No stridor. No wheezes. No rales.  ?CARDIOVASCULAR: Regular rate and rhythm, positive S1-S2, no murmurs rubs or gallops appreciated. ?ABDOMINAL: Obese, soft, nontender, nondistended, positive bowel sounds all 4 quadrants. No apparent ascites.  ?EXTREMITIES: No peripheral edema, warm to touch,  ?HEMATOLOGIC: No significant bruising ?NEUROLOGIC: Oriented to person, place, and  time. Nonfocal. Normal muscle tone.  ?PSYCHIATRIC: Normal mood and affect. Normal behavior. Cooperative ? ?CARDIAC DATABASE: ?EKG: ?11/03/2021: NSR, 70 bpm, PRWP, nonspecific T-abnormality, without underlying injury pattern. ? ?Echocardiogram: ?11/2018: LVEF 40-45%,  grade 1 diastolic impairment, mild LAE, PASP 33 mmHg.  ? ?11/22/2021: LVEF 55-60%, moderate LVH, no significant valvular heart disease. ? ?Stress Testing: ?Myocardial perfusion imaging study 03/31/2014: ?Moderate size, mild ischemia in the inferior wall extending from the base towards the apex.  Inferior wall hypokinesis, LVEF 49% scan study. ? ?Heart Catheterization: ?None ? ?CCTA  ?11/27/2018: ?Coronary artery calcium score 157 Agatston units. This places the patient in the 83rd percentile, suggesting high risk for future ?cardiac events. ? Mild stenosis in the proximal LCx and the proximal and mid RCA. ?FFR 0.82 distal RCA ?FFR 0.85 mid LCx ?No evidence for hemodynamically significant stenosis. ? ?LABORATORY DATA: ?External Labs:  ?Date Collected: 09/23/2021 , information obtained by [facility] ?Potassium: 4.4 ?Creatinine 1.01 mg/dL. ?eGFR: 86 mL/min per 1.73 m? ?Hemoglobin: 14.3 g/dL and hematocrit: 45.2 % ?Lipid profile: Total cholesterol 209 , triglycerides 451 , HDL 34 , non-HDL 175 ?AST: 17 ,  ALT: 23 , alkaline phosphatase: 119  ?BNP 40 ?Hemoglobin A1c: 6.6 ?TSH: 2.21   ? ?External Labs: ?Collected: March 03, 2022 performed at PCPs office available in [place]. ?TSH 1.75. ?Hemoglobin A1c 6.9. ?Hemoglobin 13.9 g/dL, hematocrit 43.6%. ?AST 14, ALT 18, alkaline phosphatase 72. ?Sodium 143, potassium 4.4, chloride 105, bicarb 28. ?BUN 19, creatinine 1.22. ?eGFR 68. ?Total cholesterol 151, HDL 41, triglycerides 134, LDL 87, non-HDL 110 ? ? ?IMPRESSION: ? ?  ICD-10-CM   ?1. Recovered cardiomyopathy  I42.9 carvedilol (COREG) 25 MG tablet  ?  spironolactone (ALDACTONE) 25 MG tablet  ?  Basic metabolic panel  ?  Magnesium  ?  ?2. Essential hypertension,  benign  I10 carvedilol (COREG) 25 MG tablet  ?  spironolactone (ALDACTONE) 25 MG tablet  ?  Basic metabolic panel  ?  Magnesium  ?  ?3. Nonobstructive atherosclerosis of coronary artery  I25.10   ?  ?4

## 2022-04-05 ENCOUNTER — Other Ambulatory Visit: Payer: Self-pay

## 2022-04-05 ENCOUNTER — Other Ambulatory Visit (HOSPITAL_COMMUNITY): Payer: Self-pay

## 2022-04-05 DIAGNOSIS — I429 Cardiomyopathy, unspecified: Secondary | ICD-10-CM

## 2022-04-05 DIAGNOSIS — I10 Essential (primary) hypertension: Secondary | ICD-10-CM

## 2022-04-10 ENCOUNTER — Other Ambulatory Visit: Payer: Self-pay | Admitting: Cardiology

## 2022-04-10 ENCOUNTER — Other Ambulatory Visit (HOSPITAL_COMMUNITY): Payer: Self-pay

## 2022-04-10 DIAGNOSIS — G4733 Obstructive sleep apnea (adult) (pediatric): Secondary | ICD-10-CM

## 2022-04-10 DIAGNOSIS — I429 Cardiomyopathy, unspecified: Secondary | ICD-10-CM

## 2022-04-11 ENCOUNTER — Other Ambulatory Visit (HOSPITAL_COMMUNITY): Payer: Self-pay

## 2022-04-11 MED ORDER — ENTRESTO 97-103 MG PO TABS
1.0000 | ORAL_TABLET | Freq: Two times a day (BID) | ORAL | 0 refills | Status: DC
  Filled 2022-04-11: qty 180, 90d supply, fill #0

## 2022-04-12 ENCOUNTER — Other Ambulatory Visit (HOSPITAL_COMMUNITY): Payer: Self-pay

## 2022-04-12 MED ORDER — FENOFIBRATE 145 MG PO TABS
145.0000 mg | ORAL_TABLET | Freq: Every day | ORAL | 0 refills | Status: DC
  Filled 2022-04-12: qty 90, 90d supply, fill #0

## 2022-05-09 ENCOUNTER — Encounter: Payer: Self-pay | Admitting: Dietician

## 2022-05-09 DIAGNOSIS — N471 Phimosis: Secondary | ICD-10-CM | POA: Diagnosis not present

## 2022-05-09 DIAGNOSIS — N481 Balanitis: Secondary | ICD-10-CM | POA: Diagnosis not present

## 2022-05-09 DIAGNOSIS — I10 Essential (primary) hypertension: Secondary | ICD-10-CM | POA: Diagnosis not present

## 2022-05-13 DIAGNOSIS — N471 Phimosis: Secondary | ICD-10-CM | POA: Diagnosis not present

## 2022-05-13 DIAGNOSIS — N481 Balanitis: Secondary | ICD-10-CM | POA: Diagnosis not present

## 2022-05-23 DIAGNOSIS — I10 Essential (primary) hypertension: Secondary | ICD-10-CM | POA: Diagnosis not present

## 2022-05-23 DIAGNOSIS — I5042 Chronic combined systolic (congestive) and diastolic (congestive) heart failure: Secondary | ICD-10-CM | POA: Diagnosis not present

## 2022-05-23 DIAGNOSIS — G4733 Obstructive sleep apnea (adult) (pediatric): Secondary | ICD-10-CM | POA: Diagnosis not present

## 2022-06-02 ENCOUNTER — Other Ambulatory Visit: Payer: Self-pay | Admitting: Urology

## 2022-06-02 ENCOUNTER — Encounter (HOSPITAL_BASED_OUTPATIENT_CLINIC_OR_DEPARTMENT_OTHER): Payer: Self-pay | Admitting: Urology

## 2022-06-02 ENCOUNTER — Other Ambulatory Visit: Payer: Self-pay

## 2022-06-02 NOTE — Progress Notes (Addendum)
Spoke w/ via phone for pre-op interview---pt Lab needs dos----  I stat  per anesthesia, surgery orders need 2nd sign           COVID test -----patient states asymptomatic no test needed Arrive at -------1145 am 06-07-2021 NPO after MN NO Solid Food.  Clear liquids from MN until---1045 am Med rec completed Medications to take morning of surgery -----carvedilol, entresto Diabetic medication -----n/a Patient instructed no nail polish to be worn day of surgery Patient instructed to bring photo id and insurance card day of surgery Patient aware to have Driver (ride ) / caregiver   wife marjette cell [phone]  for 24 hours after surgery  Patient Special Instructions -----none Pre-Op special Istructions -----bring cpap mask tubing and machine and leave in car Patient verbalized understanding of instructions that were given at this phone interview. Patient denies shortness of breath, chest pain, fever, cough at this phone interview.   [facility] cardiology s Odis Hollingshead 04-04-2022 epic Ct coronary 11-24-2021 epic Echo LVEF 55 to 60 % epic Ekg 11-03-2021 chart/epic Sleep study 02-26-2020 epic Cpap titration 04-05-2020 epic

## 2022-06-06 NOTE — H&P (Signed)
Patient is a 61 year old African-American male seen today for evaluation of phimosis and recurrent balanitis. He states that he is uncircumcised has had some issues retracting the foreskin over the last few months but is gotten worse recently. He has noted some redness and irritation of the head of the penis. He is a diabetic. Voiding satisfactorily. Here for evaluation and management.     ALLERGIES: None   MEDICATIONS: Metformin Hcl 500 mg tablet  Atorvastatin Calcium 20 mg tablet  Carvedilol Er 20 mg capsule,extended release multiphase 24hr  Entresto 97 mg-103 mg tablet  Fenofibrate 145 mg tablet  Spironolactone 25 mg tablet     GU PSH: None   NON-GU PSH: Knee replacement, Left     GU PMH: None   NON-GU PMH: Congestive heart failure Diabetes Type 2 Hypertension Sleep Apnea    FAMILY HISTORY: Diabetes - Mother, Father Hypertension - Mother, Father   SOCIAL HISTORY: Marital Status: Married Preferred Language: English; Ethnicity: Not Hispanic Or Latino; Race: Black or African American Current Smoking Status: Patient does not smoke anymore. Has not smoked since 04/18/2017.   Tobacco Use Assessment Completed: Used Tobacco in last 30 days? Does not use smokeless tobacco. Has never drank.  Does not use drugs. Drinks 2 caffeinated drinks per day.    REVIEW OF SYSTEMS:    GU Review Male:   Patient reports frequent urination, hard to postpone urination, get up at night to urinate, and erection problems. Patient denies burning/ pain with urination, leakage of urine, stream starts and stops, trouble starting your stream, have to strain to urinate , and penile pain.  Gastrointestinal (Upper):   Patient denies nausea, vomiting, and indigestion/ heartburn.  Gastrointestinal (Lower):   Patient denies diarrhea and constipation.  Constitutional:   Patient denies fever, night sweats, weight loss, and fatigue.  Skin:   Patient denies skin rash/ lesion and itching.  Eyes:   Patient denies  blurred vision and double vision.  Ears/ Nose/ Throat:   Patient denies sore throat and sinus problems.  Hematologic/Lymphatic:   Patient denies swollen glands and easy bruising.  Cardiovascular:   Patient denies leg swelling and chest pains.  Respiratory:   Patient denies cough and shortness of breath.  Endocrine:   Patient denies excessive thirst.  Musculoskeletal:   Patient denies back pain and joint pain.  Neurological:   Patient denies headaches and dizziness.  Psychologic:   Patient denies depression and anxiety.   Notes: swelling in penis    VITAL SIGNS:      05/13/2022 09:51 AM  Weight 240 lb / 108.86 kg  Height 67 in / 170.18 cm  BP 137/90 mmHg  Pulse 71 /min  Temperature 97.8 F / 36.5 C  BMI 37.6 kg/m   GU PHYSICAL EXAMINATION:    Testes: No tenderness, no swelling, no enlargement left testes. No tenderness, no swelling, no enlargement right testes. Normal location left testes. Normal location right testes. No mass, no cyst, no varicocele, no hydrocele left testes. No mass, no cyst, no varicocele, no hydrocele right testes.  Urethral Meatus: Normal size. No lesion, no wart, no discharge, no polyp. Normal location.  Penis: Penis uncircumcised. No foreskin warts, no cracks. No dorsal peyronie's plaques, no left corporal peyronie's plaques, no right corporal peyronie's plaques, no scarring, no shaft warts. No balanitis, no meatal stenosis. Foreskin somewhat tight but I was able to retract this and I do not see any obvious glanular lesions is mildly erythematous consistent with balanitis   MULTI-SYSTEM PHYSICAL EXAMINATION:  Constitutional: Well-nourished. No physical deformities. Normally developed. Good grooming.  Neck: Neck symmetrical, not swollen. Normal tracheal position.  Respiratory: No labored breathing, no use of accessory muscles.   Cardiovascular: Normal temperature, normal extremity pulses, no swelling, no varicosities.  Lymphatic: No enlargement of neck,  axillae, groin.  Skin: No paleness, no jaundice, no cyanosis. No lesion, no ulcer, no rash.  Neurologic / Psychiatric: Oriented to time, oriented to place, oriented to person. No depression, no anxiety, no agitation.  Eyes: Normal conjunctivae. Normal eyelids.  Ears, Nose, Mouth, and Throat: Left ear no scars, no lesions, no masses. Right ear no scars, no lesions, no masses. Nose no scars, no lesions, no masses. Normal hearing. Normal lips.  Musculoskeletal: Normal gait and station of head and neck.     Complexity of Data:  Source Of History:  Patient  Records Review:   Previous Doctor Records, Previous Patient Records  Urine Test Review:   Urinalysis   PROCEDURES:          Urinalysis w/Scope - 81001 Dipstick Dipstick Cont'd Micro  Color: Yellow Bilirubin: Neg mg/dL WBC/hpf: 0 - 5/hpf  Appearance: Clear Ketones: Neg mg/dL RBC/hpf: 0 - 2/hpf  Specific Gravity: 1.020 Blood: Neg ery/uL Bacteria: NS (Not Seen)  pH: 6.0 Protein: Neg mg/dL Cystals: NS (Not Seen)  Glucose: 3+ mg/dL Urobilinogen: 0.2 mg/dL Casts: NS (Not Seen)    Nitrites: Neg Trichomonas: Not Present    Leukocyte Esterase: Trace leu/uL Mucous: Not Present      Epithelial Cells: 0 - 5/hpf      Yeast: NS (Not Seen)      Sperm: Not Present    ASSESSMENT:      ICD-10 Details  1 GU:   Phimosis - N47.1 Acute, Complicated Injury  2   Balanitis - N48.1 Acute, Complicated Injury   PLAN:           Document Letter(s):  Created for Patient: Clinical Summary         Notes:   I discussed physical exam findings and based on foreskin being tight recommended that we proceed with circumcision on elective basis. Risk and benefits that procedure were discussed as outlined below. Patient agreeable. We will schedule accordingly in the near future.  Circumcision consent: I have discussed with the patient the risks and benefits of the procedure including but not limited to bleeding, infection, damage to adjacent structures including the  urethra wth possible need for further procedures. Risk of numbness and decreased sensation of the penis, scarring of the penile skin and pain associated with scarring. I have also discussed with the patient the alternatives to circumcision. Patient voices understanding of the risks and benefits of the above procedure and consents.

## 2022-06-06 NOTE — Progress Notes (Signed)
Left message to come in at 0930 instead of 1145. Asked to call back to verify time change.

## 2022-06-07 ENCOUNTER — Encounter (HOSPITAL_BASED_OUTPATIENT_CLINIC_OR_DEPARTMENT_OTHER): Payer: Self-pay | Admitting: Urology

## 2022-06-07 ENCOUNTER — Encounter (HOSPITAL_BASED_OUTPATIENT_CLINIC_OR_DEPARTMENT_OTHER): Payer: Self-pay | Admitting: Anesthesiology

## 2022-06-07 ENCOUNTER — Encounter (HOSPITAL_BASED_OUTPATIENT_CLINIC_OR_DEPARTMENT_OTHER)
Admission: RE | Disposition: A | Discharge status: Home / Self Care | Payer: Self-pay | Source: Home / Self Care | Attending: Urology

## 2022-06-07 ENCOUNTER — Ambulatory Visit (HOSPITAL_BASED_OUTPATIENT_CLINIC_OR_DEPARTMENT_OTHER)
Admission: RE | Admit: 2022-06-07 | Discharge: 2022-06-07 | Disposition: A | Discharge status: Home / Self Care | Payer: 59 | Attending: Urology | Admitting: Urology

## 2022-06-07 DIAGNOSIS — I10 Essential (primary) hypertension: Secondary | ICD-10-CM

## 2022-06-07 DIAGNOSIS — Z01818 Encounter for other preprocedural examination: Secondary | ICD-10-CM

## 2022-06-07 HISTORY — DX: Phimosis: N47.1

## 2022-06-07 SURGERY — CIRCUMCISION, ADULT
Anesthesia: General

## 2022-06-07 MED ORDER — PROPOFOL 10 MG/ML IV BOLUS
INTRAVENOUS | Status: AC
  Filled 2022-06-07: qty 20

## 2022-06-07 MED ORDER — MIDAZOLAM HCL 2 MG/2ML IJ SOLN
INTRAMUSCULAR | Status: AC
  Filled 2022-06-07: qty 2

## 2022-06-07 MED ORDER — ACETAMINOPHEN 500 MG PO TABS
ORAL_TABLET | ORAL | Status: AC
  Filled 2022-06-07: qty 2

## 2022-06-07 MED ORDER — CEFAZOLIN SODIUM-DEXTROSE 2-4 GM/100ML-% IV SOLN: 2.0000 g | INTRAVENOUS | Status: DC

## 2022-06-07 MED ORDER — DEXAMETHASONE SODIUM PHOSPHATE 10 MG/ML IJ SOLN
INTRAMUSCULAR | Status: AC
  Filled 2022-06-07: qty 1

## 2022-06-07 MED ORDER — FENTANYL CITRATE (PF) 100 MCG/2ML IJ SOLN
INTRAMUSCULAR | Status: AC
  Filled 2022-06-07: qty 2

## 2022-06-07 MED ORDER — ACETAMINOPHEN 500 MG PO TABS: 1000.0000 mg | ORAL_TABLET | Freq: Once | ORAL | Status: DC

## 2022-06-07 MED ORDER — CEFAZOLIN SODIUM-DEXTROSE 2-4 GM/100ML-% IV SOLN
INTRAVENOUS | Status: AC
  Filled 2022-06-07: qty 100

## 2022-06-07 MED ORDER — LACTATED RINGERS IV SOLN: INTRAVENOUS | Status: DC

## 2022-06-07 MED ORDER — LIDOCAINE HCL (PF) 2 % IJ SOLN
INTRAMUSCULAR | Status: AC
  Filled 2022-06-07: qty 5

## 2022-06-07 MED ORDER — ONDANSETRON HCL 4 MG/2ML IJ SOLN
INTRAMUSCULAR | Status: AC
  Filled 2022-06-07: qty 2

## 2022-06-07 SURGICAL SUPPLY — 1 items: BLADE CLIPPER SENSICLIP SURGIC (BLADE) IMPLANT

## 2022-06-07 NOTE — Anesthesia Preprocedure Evaluation (Deleted)
Anesthesia Evaluation    Airway        Dental   Pulmonary sleep apnea , former smoker,           Cardiovascular hypertension, Pt. on medications and Pt. on home beta blockers + CAD ('19: Mild stenosis in the proximal LCx and the proximal and mid RCA) and +CHF Sherryll Burger)    '22 ECHO: EF 55-60%, normal LVF, mod LVH, no significant diastolic or valvular abnormalities   Neuro/Psych Depression    GI/Hepatic GERD  Medicated,  Endo/Other  diabetes, Oral Hypoglycemic AgentsMorbid obesity  Renal/GU      Musculoskeletal   Abdominal   Peds  Hematology   Anesthesia Other Findings   Reproductive/Obstetrics                             Anesthesia Physical Anesthesia Plan  ASA: 3  Anesthesia Plan: General   Post-op Pain Management: Tylenol PO (pre-op)*   Induction: Intravenous  PONV Risk Score and Plan: 2 and Ondansetron and Dexamethasone  Airway Management Planned: LMA  Additional Equipment: None  Intra-op Plan:   Post-operative Plan:   Informed Consent:   Plan Discussed with:   Anesthesia Plan Comments: (Pt ate popcorn this am, Dr. Benancio Deeds cancelled rather than delay)       Anesthesia Quick Evaluation

## 2022-06-13 ENCOUNTER — Other Ambulatory Visit (HOSPITAL_COMMUNITY): Payer: Self-pay

## 2022-06-13 ENCOUNTER — Other Ambulatory Visit: Payer: Self-pay | Admitting: Urology

## 2022-06-22 ENCOUNTER — Encounter: Payer: 59 | Attending: Physician Assistant | Admitting: Registered"

## 2022-06-22 ENCOUNTER — Encounter: Payer: Self-pay | Admitting: Registered"

## 2022-06-22 DIAGNOSIS — E119 Type 2 diabetes mellitus without complications: Secondary | ICD-10-CM | POA: Diagnosis not present

## 2022-06-22 DIAGNOSIS — Z713 Dietary counseling and surveillance: Secondary | ICD-10-CM | POA: Diagnosis not present

## 2022-06-22 DIAGNOSIS — R638 Other symptoms and signs concerning food and fluid intake: Secondary | ICD-10-CM | POA: Insufficient documentation

## 2022-06-22 DIAGNOSIS — E1169 Type 2 diabetes mellitus with other specified complication: Secondary | ICD-10-CM

## 2022-06-22 NOTE — Patient Instructions (Addendum)
-   Aim to increase cardio with walking at least 2 times/week for 20-30 minutes.   - Aim to introduce green beans and carrots into your day.

## 2022-06-22 NOTE — Progress Notes (Signed)
Diabetes Self-Management Education  Visit Type:  First/Initial  Appt. Start Time: 8:13  Appt. End Time: 9:15  06/22/2022  [name], identified by name and date of birth, is a 61 y.o. male with a diagnosis of Diabetes: Type 2.   ASSESSMENT  States his biggest problem is his eating habits. States he doesn't eat any vegetables because he doesn't like them.  States he used to help his grandfather pick vegetables from the garden/farm and due to seeing bugs and worms on them, he does not eat them currently.  States he doesn't eat beef as often anymore. Reports eating more chicken, seafood, and ham sometimes.  States he has stopped eating canned spaghetti and meatballs. States he loves pasta: lasagna and spaghetti. States he doesn't eat out often and cooks his own food. States he has moments where people tell him not to eat something and it makes him want to eat it even more.   States he is kind of afraid of having diabetes because it is known to kill. Reports he never thought it would affect his family. Reports mom had diabetes as well.   States he hasn't exercised and lost the motivation for it. States he also had a knee replacement and his support team hasn't been there for him.  Recent labs show (02/2022): Elevated glucose (113) Cholesterol and triglycerides have decreased over time to within normal limits.    States he is used to work close 100 hours/week but now only works 40 hours/week due to recently quitting a job.   Pt expectations: wants to know how to increase vegetable intake  There were no vitals taken for this visit. There is no height or weight on file to calculate BMI.    Diabetes Self-Management Education - 06/22/22 0827       Health Coping   How would you rate your overall health? Fair      Psychosocial Assessment   Patient Belief/Attitude about Diabetes Afraid    Self-care barriers None    Patient Concerns Nutrition/Meal planning    Special Needs None     Preferred Learning Style No preference indicated    Learning Readiness Contemplating      Complications   Last HgB A1C per patient/outside source 6.6 %    How often do you check your blood sugar? 0 times/day (not testing)    Have you had a dilated eye exam in the past 12 months? Yes    Have you had a dental exam in the past 12 months? No    Are you checking your feet? No      Activity / Exercise   How many days per week do you exercise? 0    How many minutes per day do you exercise? 0    Total minutes per week of exercise 0      Patient Education   Previous Diabetes Education No    Disease Pathophysiology Factors that contribute to the development of diabetes    Healthy Eating Role of diet in the treatment of diabetes and the relationship between the three main macronutrients and blood glucose level    Being Active Role of exercise on diabetes management, blood pressure control and cardiac health.    Diabetes Stress and Support Identified and addressed patients feelings and concerns about diabetes;Role of stress on diabetes;Worked with patient to identify barriers to care and solutions;Brainstormed with patient on coping mechanisms for social situations, getting support from significant others, dealing with feelings about diabetes  Individualized Goals (developed by patient)   Physical Activity Exercise 1-2 times per week;30 minutes per day    Medications take my medication as prescribed    Health Coping Ask for help with psychological, social, or emotional issues      Post-Education Assessment   Patient understands the diabetes disease and treatment process. Demonstrates understanding / competency    Patient understands incorporating nutritional management into lifestyle. Needs Review    Patient undertands incorporating physical activity into lifestyle. Demonstrates understanding / competency    Patient understands using medications safely. Demonstrates understanding /  competency    Patient understands monitoring blood glucose, interpreting and using results Needs Review    Patient understands prevention, detection, and treatment of acute complications. Needs Instruction    Patient understands prevention, detection, and treatment of chronic complications. Needs Review    Patient understands how to develop strategies to address psychosocial issues. Comprehends key points    Patient understands how to develop strategies to promote health/change behavior. Comprehends key points      Outcomes   Program Status Not Completed             Learning Objective:  Patient will have a greater understanding of diabetes self-management. Patient education plan is to attend individual and/or group sessions per assessed needs and concerns.   Plan:   Patient Instructions  - Aim to increase cardio with walking at least 2 times/week for 20-30 minutes.   - Aim to introduce green beans and carrots into your day.    Expected Outcomes:  Demonstrated interest in learning but significant barriers to change  Education material provided: none  If problems or questions, patient to contact team via:  Phone and Email  Future DSME appointment: - 2 months

## 2022-06-23 DIAGNOSIS — I10 Essential (primary) hypertension: Secondary | ICD-10-CM | POA: Diagnosis not present

## 2022-06-23 DIAGNOSIS — I429 Cardiomyopathy, unspecified: Secondary | ICD-10-CM | POA: Diagnosis not present

## 2022-06-24 LAB — BASIC METABOLIC PANEL
BUN/Creatinine Ratio: 13 (ref 10–24)
BUN: 16 mg/dL (ref 8–27)
CO2: 24 mmol/L (ref 20–29)
Calcium: 10 mg/dL (ref 8.6–10.2)
Chloride: 106 mmol/L (ref 96–106)
Creatinine, Ser: 1.22 mg/dL (ref 0.76–1.27)
Glucose: 104 mg/dL — ABNORMAL HIGH (ref 70–99)
Potassium: 4.4 mmol/L (ref 3.5–5.2)
Sodium: 144 mmol/L (ref 134–144)
eGFR: 68 mL/min/{1.73_m2} (ref >59)

## 2022-06-24 LAB — MAGNESIUM: Magnesium: 2.3 mg/dL (ref 1.6–2.3)

## 2022-06-27 ENCOUNTER — Encounter: Payer: Self-pay | Admitting: Cardiology

## 2022-06-27 ENCOUNTER — Other Ambulatory Visit: Payer: Self-pay

## 2022-06-27 ENCOUNTER — Ambulatory Visit: Payer: 59 | Admitting: Cardiology

## 2022-06-27 VITALS — BP 130/84 | HR 68 | Temp 97.3°F | Resp 16 | Ht 67.0 in | Wt 245.0 lb

## 2022-06-27 DIAGNOSIS — E66812 Obesity, class 2: Secondary | ICD-10-CM

## 2022-06-27 DIAGNOSIS — I10 Essential (primary) hypertension: Secondary | ICD-10-CM

## 2022-06-27 DIAGNOSIS — E119 Type 2 diabetes mellitus without complications: Secondary | ICD-10-CM

## 2022-06-27 DIAGNOSIS — I2510 Atherosclerotic heart disease of native coronary artery without angina pectoris: Secondary | ICD-10-CM | POA: Diagnosis not present

## 2022-06-27 DIAGNOSIS — E781 Pure hyperglyceridemia: Secondary | ICD-10-CM

## 2022-06-27 DIAGNOSIS — E782 Mixed hyperlipidemia: Secondary | ICD-10-CM | POA: Diagnosis not present

## 2022-06-27 DIAGNOSIS — Z6838 Body mass index (BMI) 38.0-38.9, adult: Secondary | ICD-10-CM | POA: Diagnosis not present

## 2022-06-27 DIAGNOSIS — G4733 Obstructive sleep apnea (adult) (pediatric): Secondary | ICD-10-CM | POA: Diagnosis not present

## 2022-06-27 DIAGNOSIS — I429 Cardiomyopathy, unspecified: Secondary | ICD-10-CM

## 2022-06-27 NOTE — Progress Notes (Signed)
Date:  06/27/2022   ID:  Thomas Yoder, DOB [birth year], MRN [id number]  PCP:  Everardo Beals, NP  Cardiologist:  Rex Kras, DO, [facility]  (established care 11/03/2021) Former Cardiology Providers: Dr. Fransico Him   Date: 06/27/22 Last Office Visit: 04/04/2022  Chief Complaint  Patient presents with   Hypertension   Recovered Cardiomyopathy   Follow-up    3 months    HPI  Thomas Yoder is a 61 y.o. African-American male whose past medical history and cardiovascular risk factors include: Diabetes, hypertension, intermittent noncompliance, tobacco use, snoring, recovered nonischemic cardiomyopathy, nonobstructive CAD by CT 11/2018, former smoker, noncompliance, obesity due to excess calories.  Patient presents today for 77-monthfollow-up visit for management of recovered cardiomyopathy and benign essential hypertension.  Patient was noted to have an LVEF of 40 to 45% likely secondary to nonischemic cardiomyopathy.  After up titration of medical therapy repeat echocardiogram noted LVEF of 55 to 60%.  He remains on current medical therapy but does not recall his medications.  I tried calling her wife after today's office visit but had to leave a voicemail.  Clinically denies anginal discomfort or heart failure symptoms.  No hospitalizations or urgent care visits for cardiovascular symptoms since last office encounter.  Patient is working on lifestyle changes but has not lost any significant weight since last office visit.  Patient is educated on the importance of improving his modifiable cardiovascular risk factors prior to down titration of medical therapy which he is requesting at every visit.  Recent labs from June 23, 2022 independently reviewed and noted below for further reference.  Patient states that he does have sleep apnea and is currently using his CPAP regularly.  FUNCTIONAL STATUS: No structured exercise program or daily routine. But active with working two jobs and claims  to walk 35,000 steps.    ALLERGIES: No Known Allergies  MEDICATION LIST PRIOR TO VISIT: Current Meds  Medication Sig   acetaminophen (TYLENOL) 500 MG tablet Take 1,000 mg by mouth every 6 (six) hours as needed for moderate pain.   aspirin EC 81 MG EC tablet Take 1 tablet (81 mg total) by mouth daily.   atorvastatin (LIPITOR) 20 MG tablet Take 1 tablet (20 mg total) by mouth daily.   carvedilol (COREG) 25 MG tablet Take 1 tablet (25 mg total) by mouth 2 (two) times daily.   empagliflozin (JARDIANCE) 10 MG TABS tablet Take 1 tablet (10 mg total) by mouth daily.   fenofibrate (TRICOR) 145 MG tablet Take 1 tablet (145 mg total) by mouth daily.   metFORMIN (GLUCOPHAGE) 500 MG tablet Take 1 tablet (500 mg total) by mouth 2 (two) times daily.   methocarbamol (ROBAXIN) 500 MG tablet Take 2 tablets 4 times a day by oral route as needed.   sacubitril-valsartan (ENTRESTO) 97-103 MG Take 1 tablet by mouth 2 (two) times daily.   spironolactone (ALDACTONE) 25 MG tablet Take 1 tablet (25 mg total) by mouth every morning.   [DISCONTINUED] busPIRone (BUSPAR) 15 MG tablet TAKE 1 TABLET BY MOUTH THREE TIMES DAILY FOR 10 DAYS AS NEEDED   [DISCONTINUED] furosemide (LASIX) 20 MG tablet Take 1 tablet every day by oral route for 90 days.   [DISCONTINUED] lisinopril (ZESTRIL) 20 MG tablet Take 1 tablet every day by oral route for 90 days.   [DISCONTINUED] omeprazole (PRILOSEC) 20 MG capsule Take 20 mg by mouth daily.     PAST MEDICAL HISTORY: Past Medical History:  Diagnosis Date   CAD (coronary artery disease)  a. Cardiac CT showed calcium score of 83rd percentile, mild stenosis in the proximal LCx and the proximal and mid RCA, negative FFR for hemodynamic significance.   Chronic combined systolic and diastolic CHF (congestive heart failure) ([place]) 12/11/2018   Depression    Former tobacco use    H/O noncompliance with medical treatment, presenting hazards to health    history of NICM (nonischemic  cardiomyopathy) ([place])    Recovered cardiomyopathy (LVEF 45-50%, most recent 61 to 60%)   Hyperlipidemia    Hypertension    Obesity (BMI 30-39.9)    OSA (obstructive sleep apnea) 07/24/2019   uses cpap   Phimosis    Snoring    TYPE 2Diabetes mellitus without complication ([place])     PAST SURGICAL HISTORY: Past Surgical History:  Procedure Laterality Date   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  SOCIAL HISTORY:  The patient  reports that he quit smoking about 4 years ago. His smoking use included cigarettes. He has a 17.00 pack-year smoking history. He has never used smokeless tobacco. He reports current drug use. Drug: Marijuana. He reports that he does not drink alcohol.  REVIEW OF SYSTEMS: Review of Systems  Cardiovascular:  Negative for chest pain, dyspnea on exertion, leg swelling, near-syncope, orthopnea, palpitations, paroxysmal nocturnal dyspnea and syncope.  Respiratory:  Positive for snoring. Negative for shortness of breath.   Hematologic/Lymphatic: Negative for bleeding problem.  Neurological:  Negative for dizziness and light-headedness.   PHYSICAL EXAM:    06/27/2022   11:40 AM 06/02/2022    2:20 PM 04/04/2022   10:50 AM  Vitals with BMI  Height _0  _1  _2   Weight 245 lbs 243 lbs 245 lbs  BMI 38.36 98.33 82.50  Systolic 539  767  Diastolic 84  88  Pulse 68  71    CONSTITUTIONAL: Well-developed and well-nourished. No acute distress.  SKIN: Skin is warm and dry. No rash noted. No cyanosis. No pallor. No jaundice HEAD: Normocephalic and atraumatic.  EYES: No scleral icterus MOUTH/THROAT: Moist oral membranes.  NECK: No JVD present. No thyromegaly noted. No carotid bruits  CHEST Normal respiratory effort. No  intercostal retractions  LUNGS: Clear to auscultation bilaterally.  No stridor. No wheezes. No rales.  CARDIOVASCULAR: Regular rate and rhythm, positive S1-S2, no murmurs rubs or gallops appreciated. ABDOMINAL: Obese, soft, nontender, nondistended, positive bowel sounds all 4 quadrants. No apparent ascites.  EXTREMITIES: No peripheral edema, warm to touch,  HEMATOLOGIC: No significant bruising NEUROLOGIC: Oriented to person, place, and time. Nonfocal. Normal muscle tone.  PSYCHIATRIC: Normal mood and affect. Normal behavior. Cooperative No significant change in physical examination from the last office visit.  CARDIAC DATABASE: EKG: 06/27/2022: Normal sinus rhythm, 67 bpm, consider old inferior infarct, without underlying ischemia injury pattern.  Echocardiogram: 11/2018: LVEF 40-45%,  grade 1 diastolic impairment, mild LAE, PASP 33 mmHg.   11/22/2021: LVEF 55-60%, moderate LVH, no significant valvular heart disease.  Stress Testing: Myocardial perfusion imaging study 03/31/2014: Moderate size, mild ischemia in the inferior wall extending from the base towards the apex.  Inferior wall hypokinesis, LVEF 49% scan study.  Heart Catheterization: None  CCTA  11/27/2018: Coronary artery calcium score 157 Agatston units. This places the patient in the 83rd percentile. Mild stenosis in the proximal LCx  and the proximal and mid RCA. FFR 0.82 distal RCA FFR 0.85 mid LCx No evidence for hemodynamically significant stenosis.  LABORATORY DATA: External Labs:  Date Collected: 09/23/2021 , information obtained by [facility] Potassium: 4.4 Creatinine 1.01 mg/dL. eGFR: 86 mL/min per 1.73 m Hemoglobin: 14.3 g/dL and hematocrit: 45.2 % Lipid profile: Total cholesterol 209 , triglycerides 451 , HDL 34 , non-HDL 175 AST: 17 , ALT: 23 , alkaline phosphatase: 119  BNP 40 Hemoglobin A1c: 6.6 TSH: 2.21    External Labs: Collected: March 03, 2022 performed at PCPs office available in [place]. TSH 1.75. Hemoglobin A1c 6.9. Hemoglobin 13.9 g/dL, hematocrit 43.6%. AST 14, ALT 18, alkaline phosphatase 72. Sodium 143, potassium 4.4, chloride 105, bicarb 28. BUN 19, creatinine 1.22. eGFR 68. Total cholesterol 151, HDL 41, triglycerides 134, LDL 87, non-HDL 110   IMPRESSION:    ICD-10-CM   1. Recovered cardiomyopathy  I42.9 EKG 12-Lead    2. Essential hypertension, benign  I10     3. Nonobstructive atherosclerosis of coronary artery  I25.10     4. Non-insulin dependent type 2 diabetes mellitus ([place])  E11.9     5. Mixed hyperlipidemia  E78.2     6. Hypertriglyceridemia  E78.1     7. OSA (obstructive sleep apnea)  G47.33     8. Class 2 severe obesity due to excess calories with serious comorbidity and body mass index (BMI) of 38.0 to 38.9 in adult [facility])  E66.01    Z68.38        RECOMMENDATIONS: Kruz Chiu is a 61 y.o. 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  Recovered cardiomyopathy Stage C, NYHA class II/III. Overall euvolemic. Echo: 55 to 63%, normal diastolic function, no significant valvular heart disease. Unable to tolerate BiDil due to headaches. Medications reconciled with his wife over the phone. We will continue to monitor.  Essential hypertension, benign In the past patient requested assistance with med blood pressure management. Office blood pressures are well controlled. Recent labs from 06/23/2022 independently reviewed and noted above for further reference.  Nonobstructive atherosclerosis of coronary artery Total CAC 157, 83rd percentile. Noted to have mild stenosis in the LCx/RCA distribution not hemodynamically significant per CT FFR. Currently denies angina pectoris. Currently on  aspirin and statin therapy.  Non-insulin dependent type 2 diabetes mellitus ([place]) Currently on Arni, statin, Jardiance therapy.  Mixed hyperlipidemia Currently on atorvastatin. Does not endorse myalgias or other side effects. Most recent lipid profile independently reviewed and noted above for further reference.  Hypertriglyceridemia Improved. Currently on fenofibrate. Currently managed by primary care provider.  OSA (obstructive sleep apnea) Patient states that he is currently using CPAP on a regular basis.  Reemphasized the importance of compliance.  Class 2 severe obesity due to excess calories with serious comorbidity and body mass index (BMI) of 38.0 to 38.9 in adult [facility]) Body mass index is 38.37 kg/m. I reviewed with the patient the importance of diet, regular physical activity/exercise, weight loss.   Patient is educated on increasing physical activity gradually as tolerated.  With the goal of moderate intensity exercise for 30 minutes a day 5 days a week.  FINAL MEDICATION LIST END OF ENCOUNTER: No orders of the defined types were placed in this encounter.    Medications Discontinued During This Encounter  Medication Reason   Blood Pressure Monitoring (ADULT BLOOD  PRESSURE CUFF LG) KIT    Blood Pressure Monitoring (BLOOD PRESSURE KIT) DEVI       Current Outpatient Medications:    acetaminophen (TYLENOL) 500 MG tablet, Take 1,000 mg by mouth every 6 (six) hours as needed for moderate pain., Disp: , Rfl:    aspirin EC 81 MG EC tablet, Take 1 tablet (81 mg total) by mouth daily., Disp: , Rfl:    atorvastatin (LIPITOR) 20 MG tablet, Take 1 tablet (20 mg total) by mouth daily., Disp: 90 tablet, Rfl: 1   carvedilol (COREG) 25 MG tablet, Take 1 tablet (25 mg total) by mouth 2 (two) times daily., Disp: 180 tablet, Rfl: 0   empagliflozin (JARDIANCE) 10 MG TABS tablet, Take 1 tablet (10 mg total) by mouth daily., Disp: 90 tablet, Rfl: 1   fenofibrate (TRICOR) 145 MG tablet,  Take 1 tablet (145 mg total) by mouth daily., Disp: 90 tablet, Rfl: 0   metFORMIN (GLUCOPHAGE) 500 MG tablet, Take 1 tablet (500 mg total) by mouth 2 (two) times daily., Disp: 180 tablet, Rfl: 1   methocarbamol (ROBAXIN) 500 MG tablet, Take 2 tablets 4 times a day by oral route as needed., Disp: , Rfl:    sacubitril-valsartan (ENTRESTO) 97-103 MG, Take 1 tablet by mouth 2 (two) times daily., Disp: 180 tablet, Rfl: 0   spironolactone (ALDACTONE) 25 MG tablet, Take 1 tablet (25 mg total) by mouth every morning., Disp: 90 tablet, Rfl: 0  Orders Placed This Encounter  Procedures   EKG 12-Lead    There are no Patient Instructions on file for this visit.   --Continue cardiac medications as reconciled in final medication list. --Return in about 6 months (around 12/28/2022) for Follow up, heart failure management.. Or sooner if needed. --Continue follow-up with your primary care physician regarding the management of your other chronic comorbid conditions.  Patient's questions and concerns were addressed to his satisfaction. He voices understanding of the instructions provided during this encounter.   This note was created using a voice recognition software as a result there may be grammatical errors inadvertently enclosed that do not reflect the nature of this encounter. Every attempt is made to correct such errors.  Rex Kras, [place], [facility]  Pager: [phone] Office: [phone]

## 2022-07-04 ENCOUNTER — Ambulatory Visit: Payer: 59 | Admitting: Cardiology

## 2022-07-08 ENCOUNTER — Encounter (HOSPITAL_BASED_OUTPATIENT_CLINIC_OR_DEPARTMENT_OTHER): Payer: Self-pay | Admitting: Urology

## 2022-07-08 ENCOUNTER — Other Ambulatory Visit: Payer: Self-pay

## 2022-07-08 NOTE — Progress Notes (Signed)
Spoke w/ via phone for pre-op interview---pt Lab needs dos---- I stat              Lab results------ COVID test -----patient states asymptomatic no test needed Arrive at -------1015 am 07-12-2022 NPO after MN NO Solid Food.  Clear liquids from MN until---915 am Med rec completed Medications to take morning of surgery -----Carvedilol, Entresto Diabetic medication -----none day of surgery Patient instructed no nail polish to be worn day of surgery Patient instructed to bring photo id and insurance card day of surgery Patient aware to have Driver (ride ) / caregiver   patient driving self to [facility], wife Thomas Yoder is driver home dos  for 24 hours after surgery  Patient Special Instructions -----patient instructed to stop 81 mg aspirin 5 days before surgery per dr Benancio Deeds instructions, last 81 mg aspirin was 07-06-2022 per patient Pre-Op special Istructions -----bring cpap mask tubing and machine and leave in car Patient verbalized understanding of instructions that were given at this phone interview. Patient denies shortness of breath, chest pain, fever, cough at this phone interview.   Cardiology lov dr Odis Hollingshead 06-27-2022 epic Ekg 06-27-2022 chart/epic

## 2022-07-08 NOTE — Progress Notes (Signed)
Left message with selita at dr Benancio Deeds office unable to reach patient for pre op call for 07-12-2022 surgery due to voicemail full.

## 2022-07-11 NOTE — H&P (Signed)
Patient is a 61 year old African-American male seen today for evaluation of phimosis and recurrent balanitis. He states that he is uncircumcised has had some issues retracting the foreskin over the last few months but is gotten worse recently. He has noted some redness and irritation of the head of the penis. He is a diabetic. Voiding satisfactorily. Here for evaluation and management.     ALLERGIES: None   MEDICATIONS: Metformin Hcl 500 mg tablet  Atorvastatin Calcium 20 mg tablet  Carvedilol Er 20 mg capsule,extended release multiphase 24hr  Entresto 97 mg-103 mg tablet  Fenofibrate 145 mg tablet  Spironolactone 25 mg tablet     GU PSH: None   NON-GU PSH: Knee replacement, Left     GU PMH: None   NON-GU PMH: Congestive heart failure Diabetes Type 2 Hypertension Sleep Apnea    FAMILY HISTORY: Diabetes - Mother, Father Hypertension - Mother, Father   SOCIAL HISTORY: Marital Status: Married Preferred Language: English; Ethnicity: Not Hispanic Or Latino; Race: Black or African American Current Smoking Status: Patient does not smoke anymore. Has not smoked since 04/18/2017.   Tobacco Use Assessment Completed: Used Tobacco in last 30 days? Does not use smokeless tobacco. Has never drank.  Does not use drugs. Drinks 2 caffeinated drinks per day.    REVIEW OF SYSTEMS:    GU Review Male:   Patient reports frequent urination, hard to postpone urination, get up at night to urinate, and erection problems. Patient denies burning/ pain with urination, leakage of urine, stream starts and stops, trouble starting your stream, have to strain to urinate , and penile pain.  Gastrointestinal (Upper):   Patient denies nausea, vomiting, and indigestion/ heartburn.  Gastrointestinal (Lower):   Patient denies diarrhea and constipation.  Constitutional:   Patient denies fever, night sweats, weight loss, and fatigue.  Skin:   Patient denies skin rash/ lesion and itching.  Eyes:   Patient denies  blurred vision and double vision.  Ears/ Nose/ Throat:   Patient denies sore throat and sinus problems.  Hematologic/Lymphatic:   Patient denies swollen glands and easy bruising.  Cardiovascular:   Patient denies leg swelling and chest pains.  Respiratory:   Patient denies cough and shortness of breath.  Endocrine:   Patient denies excessive thirst.  Musculoskeletal:   Patient denies back pain and joint pain.  Neurological:   Patient denies headaches and dizziness.  Psychologic:   Patient denies depression and anxiety.   Notes: swelling in penis    VITAL SIGNS:      05/13/2022 09:51 AM  Weight 240 lb / 108.86 kg  Height 67 in / 170.18 cm  BP 137/90 mmHg  Pulse 71 /min  Temperature 97.8 F / 36.5 C  BMI 37.6 kg/m   GU PHYSICAL EXAMINATION:    Testes: No tenderness, no swelling, no enlargement left testes. No tenderness, no swelling, no enlargement right testes. Normal location left testes. Normal location right testes. No mass, no cyst, no varicocele, no hydrocele left testes. No mass, no cyst, no varicocele, no hydrocele right testes.  Urethral Meatus: Normal size. No lesion, no wart, no discharge, no polyp. Normal location.  Penis: Penis uncircumcised. No foreskin warts, no cracks. No dorsal peyronie's plaques, no left corporal peyronie's plaques, no right corporal peyronie's plaques, no scarring, no shaft warts. No balanitis, no meatal stenosis. Foreskin somewhat tight but I was able to retract this and I do not see any obvious glanular lesions is mildly erythematous consistent with balanitis   MULTI-SYSTEM PHYSICAL EXAMINATION:  Constitutional: Well-nourished. No physical deformities. Normally developed. Good grooming.  Neck: Neck symmetrical, not swollen. Normal tracheal position.  Respiratory: No labored breathing, no use of accessory muscles.   Cardiovascular: Normal temperature, normal extremity pulses, no swelling, no varicosities.  Lymphatic: No enlargement of neck,  axillae, groin.  Skin: No paleness, no jaundice, no cyanosis. No lesion, no ulcer, no rash.  Neurologic / Psychiatric: Oriented to time, oriented to place, oriented to person. No depression, no anxiety, no agitation.  Eyes: Normal conjunctivae. Normal eyelids.  Ears, Nose, Mouth, and Throat: Left ear no scars, no lesions, no masses. Right ear no scars, no lesions, no masses. Nose no scars, no lesions, no masses. Normal hearing. Normal lips.  Musculoskeletal: Normal gait and station of head and neck.     Complexity of Data:  Source Of History:  Patient  Records Review:   Previous Doctor Records, Previous Patient Records  Urine Test Review:   Urinalysis   PROCEDURES:          Urinalysis w/Scope - 81001 Dipstick Dipstick Cont'd Micro  Color: Yellow Bilirubin: Neg mg/dL WBC/hpf: 0 - 5/hpf  Appearance: Clear Ketones: Neg mg/dL RBC/hpf: 0 - 2/hpf  Specific Gravity: 1.020 Blood: Neg ery/uL Bacteria: NS (Not Seen)  pH: 6.0 Protein: Neg mg/dL Cystals: NS (Not Seen)  Glucose: 3+ mg/dL Urobilinogen: 0.2 mg/dL Casts: NS (Not Seen)    Nitrites: Neg Trichomonas: Not Present    Leukocyte Esterase: Trace leu/uL Mucous: Not Present      Epithelial Cells: 0 - 5/hpf      Yeast: NS (Not Seen)      Sperm: Not Present    ASSESSMENT:      ICD-10 Details  1 GU:   Phimosis - N47.1 Acute, Complicated Injury  2   Balanitis - N48.1 Acute, Complicated Injury   PLAN:           Document Letter(s):  Created for Patient: Clinical Summary         Notes:   I discussed physical exam findings and based on foreskin being tight recommended that we proceed with circumcision on elective basis. Risk and benefits that procedure were discussed as outlined below. Patient agreeable. We will schedule accordingly in the near future.  Circumcision consent: I have discussed with the patient the risks and benefits of the procedure including but not limited to bleeding, infection, damage to adjacent structures including the  urethra wth possible need for further procedures. Risk of numbness and decreased sensation of the penis, scarring of the penile skin and pain associated with scarring. I have also discussed with the patient the alternatives to circumcision. Patient voices understanding of the risks and benefits of the above procedure and consents.     

## 2022-07-12 ENCOUNTER — Encounter (HOSPITAL_BASED_OUTPATIENT_CLINIC_OR_DEPARTMENT_OTHER): Payer: Self-pay | Admitting: Anesthesiology

## 2022-07-12 ENCOUNTER — Other Ambulatory Visit: Payer: Self-pay | Admitting: Cardiology

## 2022-07-12 ENCOUNTER — Encounter (HOSPITAL_BASED_OUTPATIENT_CLINIC_OR_DEPARTMENT_OTHER): Payer: Self-pay | Admitting: Urology

## 2022-07-12 ENCOUNTER — Encounter (HOSPITAL_BASED_OUTPATIENT_CLINIC_OR_DEPARTMENT_OTHER)
Admission: RE | Disposition: A | Discharge status: Home / Self Care | Payer: Self-pay | Source: Home / Self Care | Attending: Urology

## 2022-07-12 ENCOUNTER — Other Ambulatory Visit (HOSPITAL_COMMUNITY): Payer: Self-pay

## 2022-07-12 ENCOUNTER — Ambulatory Visit (HOSPITAL_BASED_OUTPATIENT_CLINIC_OR_DEPARTMENT_OTHER)
Admission: RE | Admit: 2022-07-12 | Discharge: 2022-07-12 | Disposition: A | Discharge status: Home / Self Care | Payer: 59 | Attending: Urology | Admitting: Urology

## 2022-07-12 DIAGNOSIS — Z538 Procedure and treatment not carried out for other reasons: Secondary | ICD-10-CM | POA: Insufficient documentation

## 2022-07-12 DIAGNOSIS — I429 Cardiomyopathy, unspecified: Secondary | ICD-10-CM

## 2022-07-12 DIAGNOSIS — E119 Type 2 diabetes mellitus without complications: Secondary | ICD-10-CM | POA: Insufficient documentation

## 2022-07-12 DIAGNOSIS — N471 Phimosis: Secondary | ICD-10-CM | POA: Diagnosis not present

## 2022-07-12 DIAGNOSIS — N481 Balanitis: Secondary | ICD-10-CM | POA: Diagnosis not present

## 2022-07-12 DIAGNOSIS — Z7984 Long term (current) use of oral hypoglycemic drugs: Secondary | ICD-10-CM | POA: Insufficient documentation

## 2022-07-12 DIAGNOSIS — I10 Essential (primary) hypertension: Secondary | ICD-10-CM

## 2022-07-12 DIAGNOSIS — Z01818 Encounter for other preprocedural examination: Secondary | ICD-10-CM

## 2022-07-12 LAB — POCT I-STAT, CHEM 8
BUN: 16 mg/dL (ref 6–20)
Calcium, Ion: 1.23 mmol/L (ref 1.15–1.40)
Chloride: 108 mmol/L (ref 98–111)
Creatinine, Ser: 0.8 mg/dL (ref 0.61–1.24)
Glucose, Bld: 116 mg/dL — ABNORMAL HIGH (ref 70–99)
HCT: 47 % (ref 39.0–52.0)
Hemoglobin: 16 g/dL (ref 13.0–17.0)
Potassium: 3.9 mmol/L (ref 3.5–5.1)
Sodium: 143 mmol/L (ref 135–145)
TCO2: 24 mmol/L (ref 22–32)

## 2022-07-12 SURGERY — CIRCUMCISION, ADULT
Anesthesia: General

## 2022-07-12 MED ORDER — CEFAZOLIN SODIUM-DEXTROSE 2-4 GM/100ML-% IV SOLN
INTRAVENOUS | Status: AC
  Filled 2022-07-12: qty 100

## 2022-07-12 MED ORDER — LACTATED RINGERS IV SOLN: INTRAVENOUS | Status: DC

## 2022-07-12 MED ORDER — CEFAZOLIN SODIUM-DEXTROSE 2-4 GM/100ML-% IV SOLN: 2.0000 g | INTRAVENOUS | Status: DC

## 2022-07-12 SURGICAL SUPPLY — 10 items
BLADE CLIPPER SENSICLIP SURGIC (BLADE) IMPLANT
BNDG CMPR 75X21 PLY HI ABS (MISCELLANEOUS)
BNDG COHESIVE 2X5 TAN ST LF (GAUZE/BANDAGES/DRESSINGS) IMPLANT
CLEANER CAUTERY TIP 5X5 PAD (MISCELLANEOUS) IMPLANT
GAUZE STRETCH 2X75IN STRL (MISCELLANEOUS) IMPLANT
NEEDLE HYPO 25X1 1.5 SAFETY (NEEDLE) IMPLANT
PAD CLEANER CAUTERY TIP 5X5 (MISCELLANEOUS)
SUT CHROMIC 3 0 PS 2 (SUTURE) IMPLANT
SUT CHROMIC 3 0 SH 27 (SUTURE) IMPLANT
SYR CONTROL 10ML LL (SYRINGE) IMPLANT

## 2022-07-12 NOTE — Progress Notes (Signed)
Wife, Marjette, called  to explain situation of increased BP, cancellation of surgery, and the importance of taking BP meds per preop instructions. Waiting for pick up.

## 2022-07-12 NOTE — Anesthesia Preprocedure Evaluation (Signed)
Anesthesia Evaluation    Reviewed: Allergy & Precautions, Patient's Chart, lab work & pertinent test results  History of Anesthesia Complications Negative for: history of anesthetic complications  Airway        Dental   Pulmonary sleep apnea and Continuous Positive Airway Pressure Ventilation , former smoker,           Cardiovascular hypertension, Pt. on home beta blockers and Pt. on medications + CAD     '22 TTE - EF 55-60%. Moderate left ventricular hypertrophy. No significant valvular heart disease.     Neuro/Psych PSYCHIATRIC DISORDERS Depression negative neurological ROS     GI/Hepatic negative GI ROS, Neg liver ROS,   Endo/Other  diabetes, Type 2, Oral Hypoglycemic Agents Obesity   Renal/GU negative Renal ROS     Musculoskeletal  (+) Arthritis ,   Abdominal   Peds  Hematology negative hematology ROS (+)   Anesthesia Other Findings Hx noncompliance   Reproductive/Obstetrics                             Anesthesia Physical Anesthesia Plan  ASA: 3  Anesthesia Plan: General   Post-op Pain Management: Tylenol PO (pre-op)* and Celebrex PO (pre-op)*   Induction: Intravenous  PONV Risk Score and Plan: 2 and Treatment may vary due to age or medical condition, Ondansetron, Dexamethasone and Midazolam  Airway Management Planned: LMA  Additional Equipment: None  Intra-op Plan:   Post-operative Plan: Extubation in OR  Informed Consent:   Plan Discussed with: CRNA and Anesthesiologist  Anesthesia Plan Comments:         Anesthesia Quick Evaluation

## 2022-07-12 NOTE — Progress Notes (Signed)
Patient was seen in the preoperative area scheduled for circumcision today.  Patient has uncontrolled high blood pressure his systolic pressure was over 200 initially.  Repeat blood pressure shows a this to be 170/101.  Anesthesia did not feel comfortable placed putting patient to sleep.  He did not take his blood pressure medicines earlier this morning.  Surgery was canceled and he is going to follow-up with PCP regarding BP management and will likely reschedule in the next couple weeks.

## 2022-07-12 NOTE — Progress Notes (Signed)
Dr. Mal Amabile aware of BP 174/116, no carvedilol or entresto today. Case cancelled. 1115 Dr. Benancio Deeds in to see concerning increased BP. Explained risk of anesthesia anedincreased BP. Office will call to reschedule surgery.

## 2022-07-13 ENCOUNTER — Other Ambulatory Visit (HOSPITAL_COMMUNITY): Payer: Self-pay

## 2022-07-13 MED ORDER — SPIRONOLACTONE 25 MG PO TABS
25.0000 mg | ORAL_TABLET | Freq: Every morning | ORAL | 0 refills | Status: DC
  Filled 2022-07-13: qty 90, 90d supply, fill #0

## 2022-07-13 MED ORDER — CARVEDILOL 25 MG PO TABS
25.0000 mg | ORAL_TABLET | Freq: Two times a day (BID) | ORAL | 0 refills | Status: DC
  Filled 2022-07-13: qty 180, 90d supply, fill #0

## 2022-07-14 ENCOUNTER — Other Ambulatory Visit (HOSPITAL_COMMUNITY): Payer: Self-pay

## 2022-07-15 ENCOUNTER — Other Ambulatory Visit (HOSPITAL_COMMUNITY): Payer: Self-pay

## 2022-07-15 MED ORDER — FENOFIBRATE 145 MG PO TABS
145.0000 mg | ORAL_TABLET | Freq: Every day | ORAL | 1 refills | Status: DC
  Filled 2022-07-15: qty 90, 90d supply, fill #0
  Filled 2022-11-08: qty 90, 90d supply, fill #1

## 2022-07-22 ENCOUNTER — Other Ambulatory Visit: Payer: Self-pay | Admitting: Cardiology

## 2022-07-22 ENCOUNTER — Other Ambulatory Visit (HOSPITAL_COMMUNITY): Payer: Self-pay

## 2022-07-22 DIAGNOSIS — I429 Cardiomyopathy, unspecified: Secondary | ICD-10-CM

## 2022-07-22 DIAGNOSIS — G4733 Obstructive sleep apnea (adult) (pediatric): Secondary | ICD-10-CM

## 2022-07-22 MED ORDER — ENTRESTO 97-103 MG PO TABS
1.0000 | ORAL_TABLET | Freq: Two times a day (BID) | ORAL | 0 refills | Status: DC
  Filled 2022-07-22: qty 180, 90d supply, fill #0

## 2022-07-25 ENCOUNTER — Other Ambulatory Visit (HOSPITAL_COMMUNITY): Payer: Self-pay

## 2022-08-01 DIAGNOSIS — I10 Essential (primary) hypertension: Secondary | ICD-10-CM | POA: Diagnosis not present

## 2022-08-01 DIAGNOSIS — E119 Type 2 diabetes mellitus without complications: Secondary | ICD-10-CM | POA: Diagnosis not present

## 2022-08-07 ENCOUNTER — Encounter: Payer: Self-pay | Admitting: Cardiology

## 2022-08-11 ENCOUNTER — Other Ambulatory Visit: Payer: Self-pay

## 2022-08-16 ENCOUNTER — Other Ambulatory Visit: Payer: Self-pay | Admitting: Urology

## 2022-08-16 NOTE — Progress Notes (Signed)
COVID Vaccine Completed:  Date of COVID positive in last 90 days:  PCP - Ovidio Hanger, NP Cardiologist - Tessa Lerner, DO  Chest x-ray -  EKG - 06-27-22 Epic Stress Test - greater than 2 years Epic ECHO - 11-22-21 Epic Cardiac Cath -  Pacemaker/ICD device last checked: Spinal Cord Stimulator: Coronary CT - 2019 Epic  Bowel Prep -   Sleep Study - Yes, +sleep apnea CPAP -   Fasting Blood Sugar -  Checks Blood Sugar _____ times a day  Blood Thinner Instructions: Aspirin Instructions:  ASA 81 mg  Last Dose:  Activity level:  Can go up a flight of stairs and perform activities of daily living without stopping and without symptoms of chest pain or shortness of breath.  Able to exercise without symptoms  Unable to go up a flight of stairs without symptoms of     Anesthesia review: CAD, CHF, HTN, DM, OSA  Patient denies shortness of breath, fever, cough and chest pain at PAT appointment  Patient verbalized understanding of instructions that were given to them at the PAT appointment. Patient was also instructed that they will need to review over the PAT instructions again at home before surgery.

## 2022-08-16 NOTE — Patient Instructions (Addendum)
SURGICAL WAITING ROOM VISITATION Patients having surgery or a procedure may have no more than 2 support people in the waiting area - these visitors may rotate.   Children under the age of 62 must have an adult with them who is not the patient. If the patient needs to stay at the hospital during part of their recovery, the visitor guidelines for inpatient rooms apply. Pre-op nurse will coordinate an appropriate time for 1 support person to accompany patient in pre-op.  This support person may not rotate.    Please refer to the [facility] website for the visitor guidelines for Inpatients (after your surgery is over and you are in a regular room).      Your procedure is scheduled on: 08-23-22   Report to [facility] Main Entrance    Report to admitting at 12:15 PM   Call this number if you have problems the morning of surgery [phone]   Do not eat food :After Midnight.   After Midnight you may have the following liquids until 11:30 AM/ DAY OF SURGERY  Water Non-Citrus Juices (without pulp, NO RED) Carbonated Beverages Black Coffee (NO MILK/CREAM OR CREAMERS, sugar ok)  Clear Tea (NO MILK/CREAM OR CREAMERS, sugar ok) regular and decaf                             Plain Jell-O (NO RED)                                           Fruit ices (not with fruit pulp, NO RED)                                     Popsicles (NO RED)                                                               Sports drinks like Gatorade (NO RED)                       If you have questions, please contact your surgeon's office.   FOLLOW  ANY ADDITIONAL PRE OP INSTRUCTIONS YOU RECEIVED FROM YOUR SURGEON'S OFFICE!!!     Oral Hygiene is also important to reduce your risk of infection.                                    Remember - BRUSH YOUR TEETH THE MORNING OF SURGERY WITH YOUR REGULAR TOOTHPASTE   Do NOT smoke after Midnight   Take these medicines the morning of surgery with A SIP OF WATER:  Atorvastatin, Carvedilol, Tylenol if needed  How to Manage Your Diabetes Before and After Surgery  Why is it important to control my blood sugar before and after surgery? Improving blood sugar levels before and after surgery helps healing and can limit problems. A way of improving blood sugar control is eating a healthy diet by:  Eating less sugar and carbohydrates  Increasing activity/exercise  Talking with your doctor about reaching your blood sugar goals High blood sugars (greater than 180 mg/dL) can raise your risk of infections and slow your recovery, so you will need to focus on controlling your diabetes during the weeks before surgery. Make sure that the doctor who takes care of your diabetes knows about your planned surgery including the date and location.  How do I manage my blood sugar before surgery? Check your blood sugar at least 4 times a day, starting 2 days before surgery, to make sure that the level is not too high or low. Check your blood sugar the morning of your surgery when you wake up and every 2 hours until you get to the Short Stay unit. If your blood sugar is less than 70 mg/dL, you will need to treat for low blood sugar: Do not take insulin. Treat a low blood sugar (less than 70 mg/dL) with  cup of clear juice (cranberry or apple), 4 glucose tablets, OR glucose gel. Recheck blood sugar in 15 minutes after treatment (to make sure it is greater than 70 mg/dL). If your blood sugar is not greater than 70 mg/dL on recheck, call [phone] for further instructions. Report your blood sugar to the short stay nurse when you get to Short Stay.  If you are admitted to the hospital after surgery: Your blood sugar will be checked by the staff and you will probably be given insulin after surgery (instead of oral diabetes medicines) to make sure you have good blood sugar levels. The goal for blood sugar control after surgery is 80-180 mg/dL.   WHAT DO I DO ABOUT MY DIABETES  MEDICATION?  Do not take oral diabetes medicines (pills) the morning of surgery.  Hold Jardiance 3 days before surgery  THE DAY BEFORE SURGERY:  Take Metformin as prescribed  THE MORNING OF SURGERY: Do not take Metformin or Jardiance.  Reviewed and Endorsed by [facility] Patient Education Committee, August 2015   Bring CPAP mask and tubing day of surgery.                              You may not have any metal on your body including  jewelry, and body piercing             Do not wear  lotions, powders, cologne, or deodorant              Men may shave face and neck.   Do not bring valuables to the hospital. [place] [facility] IS NOT RESPONSIBLE   FOR VALUABLES.   Contacts, dentures or bridgework may not be worn into surgery.  DO NOT BRING YOUR HOME MEDICATIONS TO THE HOSPITAL. PHARMACY WILL DISPENSE MEDICATIONS LISTED ON YOUR MEDICATION LIST TO YOU DURING YOUR ADMISSION IN THE HOSPITAL!   Patients discharged on the day of surgery will not be allowed to drive home.  Someone NEEDS to stay with you for the first 24 hours after anesthesia.  Special Instructions: Bring a copy of your healthcare power of attorney and living will documents the day of surgery if you haven't scanned them before.  Please read over the following fact sheets you were given: IF YOU HAVE QUESTIONS ABOUT YOUR PRE-OP INSTRUCTIONS PLEASE CALL [phone] [place] - Preparing for Surgery Before surgery, you can play an important role.  Because skin is not sterile, your skin needs to be as free of germs as possible.  You  can reduce the number of germs on your skin by washing with CHG (chlorahexidine gluconate) soap before surgery.  CHG is an antiseptic cleaner which kills germs and bonds with the skin to continue killing germs even after washing. Please DO NOT use if you have an allergy to CHG or antibacterial soaps.  If your skin becomes reddened/irritated stop using the CHG and inform your nurse when you arrive  at Short Stay. Do not shave (including legs and underarms) for at least 48 hours prior to the first CHG shower.  You may shave your face/neck.  Please follow these instructions carefully:  1.  Shower with CHG Soap the night before surgery and the  morning of surgery.  2.  If you choose to wash your hair, wash your hair first as usual with your normal  shampoo.  3.  After you shampoo, rinse your hair and body thoroughly to remove the shampoo.                             4.  Use CHG as you would any other liquid soap.  You can apply chg directly to the skin and wash.  Gently with a scrungie or clean washcloth.  5.  Apply the CHG Soap to your body ONLY FROM THE NECK DOWN.   Do   not use on face/ open                           Wound or open sores. Avoid contact with eyes, ears mouth and   genitals (private parts).                       Wash face,  Genitals (private parts) with your normal soap.             6.  Wash thoroughly, paying special attention to the area where your    surgery  will be performed.  7.  Thoroughly rinse your body with warm water from the neck down.  8.  DO NOT shower/wash with your normal soap after using and rinsing off the CHG Soap.                9.  Pat yourself dry with a clean towel.            10.  Wear clean pajamas.            11.  Place clean sheets on your bed the night of your first shower and do not  sleep with pets. Day of Surgery : Do not apply any lotions/deodorants the morning of surgery.  Please wear clean clothes to the hospital/surgery center.  FAILURE TO FOLLOW THESE INSTRUCTIONS MAY RESULT IN THE CANCELLATION OF YOUR SURGERY  PATIENT SIGNATURE_________________________________  NURSE SIGNATURE__________________________________  ________________________________________________________________________

## 2022-08-17 ENCOUNTER — Encounter (HOSPITAL_COMMUNITY): Payer: Self-pay

## 2022-08-17 ENCOUNTER — Other Ambulatory Visit: Payer: Self-pay

## 2022-08-17 ENCOUNTER — Encounter (HOSPITAL_COMMUNITY)
Admission: RE | Admit: 2022-08-17 | Discharge: 2022-08-17 | Disposition: A | Discharge status: Home / Self Care | Payer: 59 | Source: Ambulatory Visit | Attending: Urology | Admitting: Urology

## 2022-08-17 VITALS — BP 163/103 | HR 65 | Temp 98.0°F | Ht 67.0 in | Wt 239.4 lb

## 2022-08-17 DIAGNOSIS — E119 Type 2 diabetes mellitus without complications: Secondary | ICD-10-CM | POA: Insufficient documentation

## 2022-08-17 DIAGNOSIS — Z01812 Encounter for preprocedural laboratory examination: Secondary | ICD-10-CM | POA: Insufficient documentation

## 2022-08-17 DIAGNOSIS — I2510 Atherosclerotic heart disease of native coronary artery without angina pectoris: Secondary | ICD-10-CM | POA: Insufficient documentation

## 2022-08-17 HISTORY — DX: Gastro-esophageal reflux disease without esophagitis: K21.9

## 2022-08-17 LAB — BASIC METABOLIC PANEL
Anion gap: 6 (ref 5–15)
BUN: 18 mg/dL (ref 6–20)
CO2: 24 mmol/L (ref 22–32)
Calcium: 10.1 mg/dL (ref 8.9–10.3)
Chloride: 109 mmol/L (ref 98–111)
Creatinine, Ser: 1.14 mg/dL (ref 0.61–1.24)
GFR, Estimated: >60 mL/min (ref >60)
Glucose, Bld: 131 mg/dL — ABNORMAL HIGH (ref 70–99)
Potassium: 5 mmol/L (ref 3.5–5.1)
Sodium: 139 mmol/L (ref 135–145)

## 2022-08-17 LAB — HEMOGLOBIN A1C
Hgb A1c MFr Bld: 6.9 % — ABNORMAL HIGH (ref 4.8–5.6)
Mean Plasma Glucose: 151.33 mg/dL

## 2022-08-17 LAB — GLUCOSE, CAPILLARY: Glucose-Capillary: 132 mg/dL — ABNORMAL HIGH (ref 70–99)

## 2022-08-18 NOTE — Progress Notes (Addendum)
Anesthesia Chart Review   Case: [id number] Date/Time: 08/23/22 1415   Procedure: CIRCUMCISION ADULT - 45 MINS   Anesthesia type: General   Pre-op diagnosis: PHIMOSIS   Location: [facility] ROOM 03 / [facility]   Surgeons: Belva Agee, MD       DISCUSSION:61 y.o. former smoker with h/o HTN, DM II, CAD, cardiomyopathy (recovered EF 55-60%), OSA, phimosis scheduled for above procedure 08/23/2022 with Dr. Karoline Caldwell.   Pt seen by cardiology 06/27/2022. Per OV note BP well controlled, no changes made to medications.   Case cancelled 07/12/2022 due to poorly controlled HTN.  He had not taken carvedilol DOS. BP on arrival 174/11  He was seen by PCP 08/01/2022. BP at this visit 118/81, pt reported home pressures of 180s/90s.  No changes to his medications at this time.   Elevated BP at PAT visit, 168/105 and 163/103. Discussed risk of cancellation DOS.  Pt was advised to call PCP.  Discussed with PCP, they will bring patient in for recheck prior to surgery.   VS: BP (!) 163/103   Pulse 65   Temp 36.7 C (Oral)   Ht 5\' 7"  (1.702 m)   Wt 108.6 kg   SpO2 99%   BMI 37.50 kg/m   PROVIDERS: , NP is PCP   Marva Panda, DO is Cardiologist  LABS: Labs reviewed: Acceptable for surgery. (all labs ordered are listed, but only abnormal results are displayed)  Labs Reviewed  HEMOGLOBIN A1C - Abnormal; Notable for the following components:      Result Value   Hgb A1c MFr Bld 6.9 (*)    All other components within normal limits  BASIC METABOLIC PANEL - Abnormal; Notable for the following components:   Glucose, Bld 131 (*)    All other components within normal limits  GLUCOSE, CAPILLARY - Abnormal; Notable for the following components:   Glucose-Capillary 132 (*)    All other components within normal limits     IMAGES:   EKG:   CV: Echocardiogram 11/22/2021:  Normal LV systolic function with visual EF 55-60%. Left ventricle cavity  is normal in size. Moderate left  ventricular hypertrophy. Normal global  wall motion. Normal diastolic filling pattern, normal LAP.  No significant valvular heart disease.  Compared to study 11/2018 LVEF improved from 40-45% to 55-60%, diastolic  function has normalized, otherwise no significant change.   Echo 11/27/2018 Study Conclusions   - Left ventricle: The cavity size was normal. There was mild    concentric hypertrophy. Systolic function was mildly to    moderately reduced. The estimated ejection fraction was in the    range of 40% to 45%. Mild diffuse hypokinesis with no    identifiable regional variations. Doppler parameters are    consistent with abnormal left ventricular relaxation (grade 1    diastolic dysfunction). Indeterminate mean left atrial filling    pressure.  - Left atrium: The atrium was mildly dilated.  - Pulmonary arteries: Systolic pressure was mildly increased. PA    peak pressure: 33 mm Hg (S).  Past Medical History:  Diagnosis Date   CAD (coronary artery disease)    a. Cardiac CT showed calcium score of 83rd percentile, mild stenosis in the proximal LCx and the proximal and mid RCA, negative FFR for hemodynamic significance.   Chronic combined systolic and diastolic CHF (congestive heart failure) (HCC) 12/11/2018   Depression    Former tobacco use    GERD (gastroesophageal reflux disease)    H/O noncompliance with medical  treatment, presenting hazards to health    history of NICM (nonischemic cardiomyopathy) (HCC)    Recovered cardiomyopathy (LVEF 45-50%, most recent 55 to 60%)   Hyperlipidemia    Hypertension    Obesity (BMI 30-39.9)    OSA (obstructive sleep apnea) 07/24/2019   uses cpap   Phimosis    Snoring    TYPE 2Diabetes mellitus without complication [facility])     Past Surgical History:  Procedure Laterality Date   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     Jodell Cipro Ward, PA-C [facility] Pre-Surgical Testing [phone]

## 2022-08-18 NOTE — Anesthesia Preprocedure Evaluation (Addendum)
Anesthesia Evaluation  Patient identified by MRN, date of birth, ID band Patient awake    Reviewed: Allergy & Precautions, NPO status , Patient's Chart, lab work & pertinent test results, reviewed documented beta blocker date and time   Airway Mallampati: II  TM Distance: >3 FB Neck ROM: Full    Dental  (+) Dental Advisory Given, Poor Dentition, Missing, Chipped   Pulmonary sleep apnea , former smoker,    Pulmonary exam normal breath sounds clear to auscultation       Cardiovascular hypertension, Pt. on medications and Pt. on home beta blockers pulmonary hypertension+ CAD and +CHF  Normal cardiovascular exam Rhythm:Regular Rate:Normal  Echocardiogram 11/22/2021:  Normal LV systolic function with visual EF 55-60%. Left ventricle cavity is normal in size. Moderate left ventricular hypertrophy. Normal global wall motion. Normal diastolic filling pattern, normal LAP. No significant valvular heart disease.  Compared to study 11/2018 LVEF improved from 40-45% to 55-60%, diastolic function has normalized, otherwise no significant change.   Echo 2019 - Left ventricle: The cavity size was normal. There was mild concentric hypertrophy. Systolic function was mildly to moderately reduced. The estimated ejection fraction was in the range of 40% to 45%. Mild diffuse hypokinesis with no identifiable regional variations. Doppler parameters are consistent with abnormal left ventricular relaxation (grade 1 diastolic dysfunction). Indeterminate mean left atrial filling pressure.  - Left atrium: The atrium was mildly dilated.  - Pulmonary arteries: Systolic pressure was mildly increased. PA peak pressure: 33 mm Hg (S). - Left ventricle: The cavity size was normal. There was mild  concentric hypertrophy. Systolic function was mildly to  moderately reduced. The estimated ejection fraction was in the range of 40% to 45%. Mild diffuse hypokinesis with no  identifiable regional variations. Doppler parameters are consistent with abnormal left ventricular relaxation (grade 1 diastolic dysfunction). Indeterminate mean left atrial filling pressure.  - Left atrium: The atrium was mildly dilated.  - Pulmonary arteries: Systolic pressure was mildly increased. PA peak pressure: 33 mm Hg (S).    Neuro/Psych PSYCHIATRIC DISORDERS Depression negative neurological ROS     GI/Hepatic Neg liver ROS, GERD  ,  Endo/Other  diabetes  Renal/GU negative Renal ROS     Musculoskeletal  (+) Arthritis ,   Abdominal (+) + obese,   Peds  Hematology negative hematology ROS (+)   Anesthesia Other Findings   Reproductive/Obstetrics                                                           Anesthesia Evaluation    Reviewed: Allergy & Precautions, Patient's Chart, lab work & pertinent test results  History of Anesthesia Complications Negative for: history of anesthetic complications  Airway        Dental   Pulmonary sleep apnea and Continuous Positive Airway Pressure Ventilation , former smoker,           Cardiovascular hypertension, Pt. on home beta blockers and Pt. on medications + CAD     '22 TTE - EF 55-60%. Moderate left ventricular hypertrophy. No significant valvular heart disease.     Neuro/Psych PSYCHIATRIC DISORDERS Depression negative neurological ROS     GI/Hepatic negative GI ROS, Neg liver ROS,   Endo/Other  diabetes, Type 2, Oral Hypoglycemic Agents Obesity   Renal/GU negative Renal ROS  Musculoskeletal  (+) Arthritis ,   Abdominal   Peds  Hematology negative hematology ROS (+)   Anesthesia Other Findings Hx noncompliance   Reproductive/Obstetrics                             Anesthesia Physical Anesthesia Plan  ASA: 3  Anesthesia Plan: General   Post-op Pain Management: Tylenol PO (pre-op)* and Celebrex PO (pre-op)*   Induction:  Intravenous  PONV Risk Score and Plan: 2 and Treatment may vary due to age or medical condition, Ondansetron, Dexamethasone and Midazolam  Airway Management Planned: LMA  Additional Equipment: None  Intra-op Plan:   Post-operative Plan: Extubation in OR  Informed Consent:   Plan Discussed with: CRNA and Anesthesiologist  Anesthesia Plan Comments:         Anesthesia Quick Evaluation  Anesthesia Physical Anesthesia Plan  ASA: 3  Anesthesia Plan: General   Post-op Pain Management: Celebrex PO (pre-op)* and Ofirmev IV (intra-op)*   Induction: Intravenous  PONV Risk Score and Plan: 3 and Ondansetron, Dexamethasone, Treatment may vary due to age or medical condition and Midazolam  Airway Management Planned: LMA  Additional Equipment: None  Intra-op Plan:   Post-operative Plan: Extubation in OR  Informed Consent: I have reviewed the patients History and Physical, chart, labs and discussed the procedure including the risks, benefits and alternatives for the proposed anesthesia with the patient or authorized representative who has indicated his/her understanding and acceptance.     Dental advisory given  Plan Discussed with: CRNA  Anesthesia Plan Comments: (See PAT note 08/17/2022)     Anesthesia Quick Evaluation

## 2022-08-19 NOTE — H&P (Signed)
Patient is a 61 year old African-American male seen today for evaluation of phimosis and recurrent balanitis. He states that he is uncircumcised has had some issues retracting the foreskin over the last few months but is gotten worse recently. He has noted some redness and irritation of the head of the penis. He is a diabetic. Voiding satisfactorily. Here for evaluation and management.     ALLERGIES: None   MEDICATIONS: Metformin Hcl 500 mg tablet  Atorvastatin Calcium 20 mg tablet  Carvedilol Er 20 mg capsule,extended release multiphase 24hr  Entresto 97 mg-103 mg tablet  Fenofibrate 145 mg tablet  Spironolactone 25 mg tablet     GU PSH: None   NON-GU PSH: Knee replacement, Left     GU PMH: None   NON-GU PMH: Congestive heart failure Diabetes Type 2 Hypertension Sleep Apnea    FAMILY HISTORY: Diabetes - Mother, Father Hypertension - Mother, Father   SOCIAL HISTORY: Marital Status: Married Preferred Language: English; Ethnicity: Not Hispanic Or Latino; Race: Black or African American Current Smoking Status: Patient does not smoke anymore. Has not smoked since 04/18/2017.   Tobacco Use Assessment Completed: Used Tobacco in last 30 days? Does not use smokeless tobacco. Has never drank.  Does not use drugs. Drinks 2 caffeinated drinks per day.    REVIEW OF SYSTEMS:    GU Review Male:   Patient reports frequent urination, hard to postpone urination, get up at night to urinate, and erection problems. Patient denies burning/ pain with urination, leakage of urine, stream starts and stops, trouble starting your stream, have to strain to urinate , and penile pain.  Gastrointestinal (Upper):   Patient denies nausea, vomiting, and indigestion/ heartburn.  Gastrointestinal (Lower):   Patient denies diarrhea and constipation.  Constitutional:   Patient denies fever, night sweats, weight loss, and fatigue.  Skin:   Patient denies skin rash/ lesion and itching.  Eyes:   Patient denies  blurred vision and double vision.  Ears/ Nose/ Throat:   Patient denies sore throat and sinus problems.  Hematologic/Lymphatic:   Patient denies swollen glands and easy bruising.  Cardiovascular:   Patient denies leg swelling and chest pains.  Respiratory:   Patient denies cough and shortness of breath.  Endocrine:   Patient denies excessive thirst.  Musculoskeletal:   Patient denies back pain and joint pain.  Neurological:   Patient denies headaches and dizziness.  Psychologic:   Patient denies depression and anxiety.   Notes: swelling in penis    VITAL SIGNS:      05/13/2022 09:51 AM  Weight 240 lb / 108.86 kg  Height 67 in / 170.18 cm  BP 137/90 mmHg  Pulse 71 /min  Temperature 97.8 F / 36.5 C  BMI 37.6 kg/m   GU PHYSICAL EXAMINATION:    Testes: No tenderness, no swelling, no enlargement left testes. No tenderness, no swelling, no enlargement right testes. Normal location left testes. Normal location right testes. No mass, no cyst, no varicocele, no hydrocele left testes. No mass, no cyst, no varicocele, no hydrocele right testes.  Urethral Meatus: Normal size. No lesion, no wart, no discharge, no polyp. Normal location.  Penis: Penis uncircumcised. No foreskin warts, no cracks. No dorsal peyronie's plaques, no left corporal peyronie's plaques, no right corporal peyronie's plaques, no scarring, no shaft warts. No balanitis, no meatal stenosis. Foreskin somewhat tight but I was able to retract this and I do not see any obvious glanular lesions is mildly erythematous consistent with balanitis   MULTI-SYSTEM PHYSICAL EXAMINATION:  Constitutional: Well-nourished. No physical deformities. Normally developed. Good grooming.  Neck: Neck symmetrical, not swollen. Normal tracheal position.  Respiratory: No labored breathing, no use of accessory muscles.   Cardiovascular: Normal temperature, normal extremity pulses, no swelling, no varicosities.  Lymphatic: No enlargement of neck,  axillae, groin.  Skin: No paleness, no jaundice, no cyanosis. No lesion, no ulcer, no rash.  Neurologic / Psychiatric: Oriented to time, oriented to place, oriented to person. No depression, no anxiety, no agitation.  Eyes: Normal conjunctivae. Normal eyelids.  Ears, Nose, Mouth, and Throat: Left ear no scars, no lesions, no masses. Right ear no scars, no lesions, no masses. Nose no scars, no lesions, no masses. Normal hearing. Normal lips.  Musculoskeletal: Normal gait and station of head and neck.     Complexity of Data:  Source Of History:  Patient  Records Review:   Previous Doctor Records, Previous Patient Records  Urine Test Review:   Urinalysis   PROCEDURES:          Urinalysis w/Scope - 81001 Dipstick Dipstick Cont'd Micro  Color: Yellow Bilirubin: Neg mg/dL WBC/hpf: 0 - 5/hpf  Appearance: Clear Ketones: Neg mg/dL RBC/hpf: 0 - 2/hpf  Specific Gravity: 1.020 Blood: Neg ery/uL Bacteria: NS (Not Seen)  pH: 6.0 Protein: Neg mg/dL Cystals: NS (Not Seen)  Glucose: 3+ mg/dL Urobilinogen: 0.2 mg/dL Casts: NS (Not Seen)    Nitrites: Neg Trichomonas: Not Present    Leukocyte Esterase: Trace leu/uL Mucous: Not Present      Epithelial Cells: 0 - 5/hpf      Yeast: NS (Not Seen)      Sperm: Not Present    ASSESSMENT:      ICD-10 Details  1 GU:   Phimosis - N47.1 Acute, Complicated Injury  2   Balanitis - N48.1 Acute, Complicated Injury   PLAN:           Document Letter(s):  Created for Patient: Clinical Summary         Notes:   I discussed physical exam findings and based on foreskin being tight recommended that we proceed with circumcision on elective basis. Risk and benefits that procedure were discussed as outlined below. Patient agreeable. We will schedule accordingly in the near future.  Circumcision consent: I have discussed with the patient the risks and benefits of the procedure including but not limited to bleeding, infection, damage to adjacent structures including the  urethra wth possible need for further procedures. Risk of numbness and decreased sensation of the penis, scarring of the penile skin and pain associated with scarring. I have also discussed with the patient the alternatives to circumcision. Patient voices understanding of the risks and benefits of the above procedure and consents.

## 2022-08-23 ENCOUNTER — Other Ambulatory Visit: Payer: Self-pay

## 2022-08-23 ENCOUNTER — Encounter (HOSPITAL_COMMUNITY): Payer: Self-pay | Admitting: Urology

## 2022-08-23 ENCOUNTER — Ambulatory Visit (HOSPITAL_COMMUNITY)
Admission: RE | Admit: 2022-08-23 | Discharge: 2022-08-23 | Disposition: A | Discharge status: Home / Self Care | Payer: 59 | Attending: Urology | Admitting: Urology

## 2022-08-23 ENCOUNTER — Encounter (HOSPITAL_COMMUNITY)
Admission: RE | Disposition: A | Discharge status: Home / Self Care | Payer: Self-pay | Source: Home / Self Care | Attending: Urology

## 2022-08-23 ENCOUNTER — Ambulatory Visit (HOSPITAL_BASED_OUTPATIENT_CLINIC_OR_DEPARTMENT_OTHER): Payer: 59 | Admitting: Certified Registered Nurse Anesthetist

## 2022-08-23 ENCOUNTER — Other Ambulatory Visit (HOSPITAL_COMMUNITY): Payer: Self-pay

## 2022-08-23 ENCOUNTER — Ambulatory Visit (HOSPITAL_COMMUNITY): Payer: 59 | Admitting: Physician Assistant

## 2022-08-23 DIAGNOSIS — Z87891 Personal history of nicotine dependence: Secondary | ICD-10-CM

## 2022-08-23 DIAGNOSIS — I110 Hypertensive heart disease with heart failure: Secondary | ICD-10-CM

## 2022-08-23 DIAGNOSIS — I2510 Atherosclerotic heart disease of native coronary artery without angina pectoris: Secondary | ICD-10-CM

## 2022-08-23 DIAGNOSIS — E119 Type 2 diabetes mellitus without complications: Secondary | ICD-10-CM | POA: Diagnosis not present

## 2022-08-23 DIAGNOSIS — I509 Heart failure, unspecified: Secondary | ICD-10-CM

## 2022-08-23 DIAGNOSIS — E669 Obesity, unspecified: Secondary | ICD-10-CM | POA: Insufficient documentation

## 2022-08-23 DIAGNOSIS — N477 Other inflammatory diseases of prepuce: Secondary | ICD-10-CM | POA: Diagnosis not present

## 2022-08-23 DIAGNOSIS — Z6837 Body mass index (BMI) 37.0-37.9, adult: Secondary | ICD-10-CM | POA: Diagnosis not present

## 2022-08-23 DIAGNOSIS — N471 Phimosis: Secondary | ICD-10-CM

## 2022-08-23 DIAGNOSIS — I272 Pulmonary hypertension, unspecified: Secondary | ICD-10-CM | POA: Diagnosis not present

## 2022-08-23 DIAGNOSIS — N481 Balanitis: Secondary | ICD-10-CM | POA: Insufficient documentation

## 2022-08-23 DIAGNOSIS — I5042 Chronic combined systolic (congestive) and diastolic (congestive) heart failure: Secondary | ICD-10-CM | POA: Insufficient documentation

## 2022-08-23 HISTORY — PX: CIRCUMCISION: SHX1350

## 2022-08-23 LAB — GLUCOSE, CAPILLARY
Glucose-Capillary: 96 mg/dL (ref 70–99)
Glucose-Capillary: 92 mg/dL (ref 70–99)

## 2022-08-23 SURGERY — CIRCUMCISION, ADULT
Anesthesia: General | Site: Penis

## 2022-08-23 MED ORDER — CELECOXIB 200 MG PO CAPS
200.0000 mg | ORAL_CAPSULE | Freq: Once | ORAL | Status: AC
  Administered 2022-08-23: 200 mg via ORAL
  Filled 2022-08-23: qty 1

## 2022-08-23 MED ORDER — 0.9 % SODIUM CHLORIDE (POUR BTL) OPTIME
TOPICAL | Status: DC | PRN
  Administered 2022-08-23: 1000 mL

## 2022-08-23 MED ORDER — PHENYLEPHRINE 80 MCG/ML (10ML) SYRINGE FOR IV PUSH (FOR BLOOD PRESSURE SUPPORT)
PREFILLED_SYRINGE | INTRAVENOUS | Status: DC | PRN
  Administered 2022-08-23 (×2): 80 ug via INTRAVENOUS

## 2022-08-23 MED ORDER — TRAMADOL HCL 50 MG PO TABS
50.0000 mg | ORAL_TABLET | Freq: Four times a day (QID) | ORAL | 0 refills | Status: DC | PRN
  Filled 2022-08-23: qty 20, 5d supply, fill #0

## 2022-08-23 MED ORDER — EPHEDRINE SULFATE-NACL 50-0.9 MG/10ML-% IV SOSY
PREFILLED_SYRINGE | INTRAVENOUS | Status: DC | PRN
  Administered 2022-08-23 (×4): 5 mg via INTRAVENOUS

## 2022-08-23 MED ORDER — FENTANYL CITRATE (PF) 100 MCG/2ML IJ SOLN
INTRAMUSCULAR | Status: DC | PRN
Start: 2022-08-23 — End: 2022-08-23
  Administered 2022-08-23 (×2): 25 ug via INTRAVENOUS
  Administered 2022-08-23: 50 ug via INTRAVENOUS

## 2022-08-23 MED ORDER — CHLORHEXIDINE GLUCONATE 0.12 % MT SOLN
15.0000 mL | Freq: Once | OROMUCOSAL | Status: AC
  Administered 2022-08-23: 15 mL via OROMUCOSAL

## 2022-08-23 MED ORDER — MIDAZOLAM HCL 2 MG/2ML IJ SOLN
INTRAMUSCULAR | Status: AC
  Filled 2022-08-23: qty 2

## 2022-08-23 MED ORDER — LIDOCAINE HCL (PF) 2 % IJ SOLN
INTRAMUSCULAR | Status: AC
  Filled 2022-08-23: qty 5

## 2022-08-23 MED ORDER — ACETAMINOPHEN 500 MG PO TABS
1000.0000 mg | ORAL_TABLET | Freq: Once | ORAL | Status: DC
  Filled 2022-08-23: qty 2

## 2022-08-23 MED ORDER — ONDANSETRON HCL 4 MG/2ML IJ SOLN
INTRAMUSCULAR | Status: DC | PRN
  Administered 2022-08-23: 4 mg via INTRAVENOUS

## 2022-08-23 MED ORDER — PROPOFOL 10 MG/ML IV BOLUS
INTRAVENOUS | Status: AC
  Filled 2022-08-23: qty 20

## 2022-08-23 MED ORDER — DEXAMETHASONE SODIUM PHOSPHATE 10 MG/ML IJ SOLN
INTRAMUSCULAR | Status: AC
  Filled 2022-08-23: qty 1

## 2022-08-23 MED ORDER — DEXAMETHASONE SODIUM PHOSPHATE 4 MG/ML IJ SOLN
INTRAMUSCULAR | Status: DC | PRN
  Administered 2022-08-23: 50 mg via INTRAVENOUS

## 2022-08-23 MED ORDER — MIDAZOLAM HCL 2 MG/2ML IJ SOLN
INTRAMUSCULAR | Status: DC | PRN
  Administered 2022-08-23: 2 mg via INTRAVENOUS

## 2022-08-23 MED ORDER — LACTATED RINGERS IV SOLN: INTRAVENOUS | Status: DC

## 2022-08-23 MED ORDER — PHENYLEPHRINE 80 MCG/ML (10ML) SYRINGE FOR IV PUSH (FOR BLOOD PRESSURE SUPPORT)
PREFILLED_SYRINGE | INTRAVENOUS | Status: AC
  Filled 2022-08-23: qty 10

## 2022-08-23 MED ORDER — BACITRACIN-NEOMYCIN-POLYMYXIN OINTMENT TUBE
TOPICAL_OINTMENT | CUTANEOUS | Status: AC
  Filled 2022-08-23: qty 14.17

## 2022-08-23 MED ORDER — ONDANSETRON HCL 4 MG/2ML IJ SOLN
INTRAMUSCULAR | Status: AC
  Filled 2022-08-23: qty 2

## 2022-08-23 MED ORDER — FENTANYL CITRATE (PF) 100 MCG/2ML IJ SOLN
INTRAMUSCULAR | Status: AC
  Filled 2022-08-23: qty 2

## 2022-08-23 MED ORDER — ORAL CARE MOUTH RINSE: 15.0000 mL | Freq: Once | OROMUCOSAL | Status: AC

## 2022-08-23 MED ORDER — EPHEDRINE 5 MG/ML INJ
INTRAVENOUS | Status: AC
Start: 2022-08-23 — End: ?
  Filled 2022-08-23: qty 5

## 2022-08-23 MED ORDER — PROPOFOL 10 MG/ML IV BOLUS
INTRAVENOUS | Status: DC | PRN
  Administered 2022-08-23: 150 mg via INTRAVENOUS

## 2022-08-23 MED ORDER — BUPIVACAINE HCL (PF) 0.25 % IJ SOLN
INTRAMUSCULAR | Status: DC | PRN
  Administered 2022-08-23: 20 mL

## 2022-08-23 MED ORDER — LIDOCAINE 2% (20 MG/ML) 5 ML SYRINGE
INTRAMUSCULAR | Status: DC | PRN
  Administered 2022-08-23: 20 mg via INTRAVENOUS

## 2022-08-23 MED ORDER — BUPIVACAINE HCL 0.25 % IJ SOLN
INTRAMUSCULAR | Status: AC
  Filled 2022-08-23: qty 1

## 2022-08-23 MED ORDER — CEFAZOLIN SODIUM-DEXTROSE 2-4 GM/100ML-% IV SOLN
2.0000 g | INTRAVENOUS | Status: AC
  Administered 2022-08-23: 2 g via INTRAVENOUS
  Filled 2022-08-23: qty 100

## 2022-08-23 SURGICAL SUPPLY — 28 items
BAG COUNTER SPONGE SURGICOUNT (BAG) IMPLANT
BAG SPNG CNTER NS LX DISP (BAG)
BLADE SURG 15 STRL LF DISP TIS (BLADE) ×1 IMPLANT
BLADE SURG 15 STRL SS (BLADE) ×1
BNDG CMPR 75X21 PLY HI ABS (MISCELLANEOUS) ×1
BNDG COHESIVE 1X5 TAN STRL LF (GAUZE/BANDAGES/DRESSINGS) ×1 IMPLANT
COVER SURGICAL LIGHT HANDLE (MISCELLANEOUS) ×1 IMPLANT
DRAPE LAPAROTOMY T 98X78 PEDS (DRAPES) ×1 IMPLANT
ELECT REM PT RETURN 15FT ADLT (MISCELLANEOUS) ×1 IMPLANT
GAUZE PETROLATUM 1 X8 (GAUZE/BANDAGES/DRESSINGS) ×1 IMPLANT
GAUZE STRETCH 2X75IN STRL (MISCELLANEOUS) IMPLANT
GLOVE SURG LX STRL 7.5 STRW (GLOVE) ×1 IMPLANT
GOWN STRL REUS W/ TWL XL LVL3 (GOWN DISPOSABLE) ×1 IMPLANT
GOWN STRL REUS W/TWL XL LVL3 (GOWN DISPOSABLE) ×1
KIT BASIN OR (CUSTOM PROCEDURE TRAY) ×1 IMPLANT
KIT TURNOVER KIT A (KITS) IMPLANT
NEEDLE HYPO 22GX1.5 SAFETY (NEEDLE) ×1 IMPLANT
NS IRRIG 1000ML POUR BTL (IV SOLUTION) IMPLANT
PACK BASIC VI WITH GOWN DISP (CUSTOM PROCEDURE TRAY) ×1 IMPLANT
PENCIL SMOKE EVACUATOR (MISCELLANEOUS) IMPLANT
SUT CHROMIC 3 0 PS 2 (SUTURE) IMPLANT
SUT CHROMIC 3 0 SH 27 (SUTURE) ×2 IMPLANT
SUT CHROMIC 4 0 SH 27 (SUTURE) ×1 IMPLANT
SYR CONTROL 10ML LL (SYRINGE) IMPLANT
TOWEL OR 17X26 10 PK STRL BLUE (TOWEL DISPOSABLE) ×1 IMPLANT
TOWEL OR NON WOVEN STRL DISP B (DISPOSABLE) ×1 IMPLANT
TUBING INSUFFLATION 10FT LAP (TUBING) IMPLANT
WATER STERILE IRR 1000ML POUR (IV SOLUTION) IMPLANT

## 2022-08-23 NOTE — Op Note (Signed)
Preoperative diagnosis:  1.  Penile phimosis  Postoperative diagnosis: 1.  Penile phimosis  Procedure(s): 1.  Circumcision  Surgeon: Dr. Karoline Caldwell  Anesthesia: General  Complications: None  EBL: Minimal  Specimens: Penile foreskin  Disposition of specimens: To pathology  Intraoperative findings: Tight penile foreskin.  Circumcision performed without difficulty.  Indication: 61-year-African-American male with penile phimosis.  Presents at this time undergo circumcision  Description of procedure:  After obtaining informed consent for the patient is taken the major OR suite placed under general anesthesia.  Placed in the supine position and genitalia prepped and draped in usual sterile fashion.  Proper pause and timeout was performed.  Patient's foreskin could be retracted but it was very tight.  Foreskin was pulled back over the head of the penis and marking pen was utilized to outline the coronal sulcus which could be seen through the overlying foreskin.  Circumcising incision was made over this line.  Foreskin was then retracted and similar circumcising incision was made approximately 2 to 3 mm beneath the coronal sulcus circumferentially.  The ring of skin between the 2 incisions were then sharply dissected and excised and a vascular plane.  Skin bleeders were cauterized using the cautery for hemostasis.  The shaft foreskin was then reapproximated skin beneath the coronal sulcus utilizing interrupted 4 and 3-0 chromic sutures.  Care was taken to align the frenular line ventrally.  The frenulum was also very tight and when excised required repair of the frenulum with some interrupted 4-0 chromic sutures.  Good hemostasis was noted.  Vaseline sterile gauze was placed along with 2 inch Kerlix and then Coban as compression dressing.  Dorsal penile block was performed with quarter percent Marcaine for postoperative anesthesia.  He was awake from anesthesia and taken back to the recovery  room stable condition.  No immediate complication from the procedure.

## 2022-08-23 NOTE — Anesthesia Postprocedure Evaluation (Signed)
Anesthesia Post Note  Patient: Thomas Yoder  Procedure(s) Performed: CIRCUMCISION ADULT (Penis)     Patient location during evaluation: PACU Anesthesia Type: General Level of consciousness: awake and alert and oriented Pain management: pain level controlled Vital Signs Assessment: post-procedure vital signs reviewed and stable Respiratory status: spontaneous breathing, nonlabored ventilation and respiratory function stable Cardiovascular status: blood pressure returned to baseline and stable Postop Assessment: no apparent nausea or vomiting Anesthetic complications: no   No notable events documented.  Last Vitals:  Vitals:   08/23/22 1545 08/23/22 1600  BP: 128/84 (!) 142/83  Pulse: 67 63  Resp: 14 13  Temp:  36.5 C  SpO2: 97% 97%    Last Pain:  Vitals:   08/23/22 1600  TempSrc:   PainSc: 0-No pain                 Raynard Mapps A.

## 2022-08-23 NOTE — Anesthesia Procedure Notes (Signed)
Procedure Name: LMA Insertion Date/Time: 08/23/2022 2:12 PM  Performed by: Vanessa [place], CRNAPre-anesthesia Checklist: Emergency Drugs available, Patient identified, Suction available and Patient being monitored Patient Re-evaluated:Patient Re-evaluated prior to induction Oxygen Delivery Method: Circle system utilized Preoxygenation: Pre-oxygenation with 100% oxygen Induction Type: IV induction Ventilation: Mask ventilation without difficulty LMA: LMA inserted LMA Size: 4.0 Number of attempts: 1 Placement Confirmation: positive ETCO2 and breath sounds checked- equal and bilateral Tube secured with: Tape Dental Injury: Teeth and Oropharynx as per pre-operative assessment

## 2022-08-23 NOTE — Transfer of Care (Signed)
Immediate Anesthesia Transfer of Care Note  Patient: Thomas Yoder  Procedure(s) Performed: CIRCUMCISION ADULT (Penis)  Patient Location: PACU  Anesthesia Type:General  Level of Consciousness: drowsy  Airway & Oxygen Therapy: Patient Spontanous Breathing and Patient connected to face mask  Post-op Assessment: Report given to RN and Post -op Vital signs reviewed and stable  Post vital signs: Reviewed and stable  Last Vitals:  Vitals Value Taken Time  BP 109/66 08/23/22 1519  Temp    Pulse 64 08/23/22 1521  Resp 16 08/23/22 1521  SpO2 100 % 08/23/22 1521  Vitals shown include unvalidated device data.  Last Pain:  Vitals:   08/23/22 1239  TempSrc:   PainSc: 0-No pain         Complications: No notable events documented.

## 2022-08-24 ENCOUNTER — Encounter (HOSPITAL_COMMUNITY): Payer: Self-pay | Admitting: Urology

## 2022-08-25 ENCOUNTER — Other Ambulatory Visit (HOSPITAL_COMMUNITY): Payer: Self-pay

## 2022-08-25 LAB — SURGICAL PATHOLOGY

## 2022-08-29 ENCOUNTER — Other Ambulatory Visit (HOSPITAL_COMMUNITY): Payer: Self-pay

## 2022-08-29 MED ORDER — METFORMIN HCL 500 MG PO TABS
500.0000 mg | ORAL_TABLET | Freq: Two times a day (BID) | ORAL | 1 refills | Status: DC
  Filled 2022-08-29: qty 180, 90d supply, fill #0
  Filled 2022-12-19: qty 180, 90d supply, fill #1

## 2022-09-06 ENCOUNTER — Encounter: Payer: 59 | Attending: Physician Assistant | Admitting: Registered"

## 2022-09-06 ENCOUNTER — Encounter: Payer: Self-pay | Admitting: Registered"

## 2022-09-06 DIAGNOSIS — N481 Balanitis: Secondary | ICD-10-CM | POA: Diagnosis not present

## 2022-09-06 DIAGNOSIS — E1169 Type 2 diabetes mellitus with other specified complication: Secondary | ICD-10-CM | POA: Insufficient documentation

## 2022-09-06 DIAGNOSIS — N471 Phimosis: Secondary | ICD-10-CM | POA: Diagnosis not present

## 2022-09-06 NOTE — Progress Notes (Signed)
Diabetes Self-Management Education  Visit Type:  Follow-up  Appt. Start Time: 8:09 Appt. End Time: 8:45  09/06/2022  Thomas Yoder, identified by name and date of birth, is a 61 y.o. male with a diagnosis of Diabetes:  .   ASSESSMENT  States he checks BP daily at home, numbers are normal.   States he eats a little green vegetables every now and then. States he has tried broccoli and green beans. States he likes them sometimes but it depends on where he orders them from. Reports he has not cooked them yet. States he is not eating out as much as before.   States he fasts around dinner time. States he eats breakfast, typically skips lunch, skips dinner, and will have an evening snack before bed around 10-12 am.States he checks his BS after work around 5-6 pm every other day. States he forgets sometimes because he is too tired.   States he works 10 am - 6:30 pm at a new job at [employer].   There were no vitals taken for this visit. There is no height or weight on file to calculate BMI.    Diabetes Self-Management Education - 58/09/98 3382       Complications   Last HgB A1C per patient/outside source 6.9 %    How often do you check your blood sugar? 3-4 times / week    Postprandial Blood glucose range (mg/dL) 70-129      Dietary Intake   Breakfast 9 am - [employer] - bacon, egg, and cheese biscuit + hash brown + Coke    Lunch 1 pm - 2 bologna and cheese sandwiches + grape soda    Dinner skipped    Beverage(s) Coke, water      Activity / Exercise   Activity / Exercise Type ADL's    How many days per week do you exercise? 0    How many minutes per day do you exercise? 0    Total minutes per week of exercise 0      Individualized Goals (developed by patient)   Nutrition General guidelines for healthy choices and portions discussed    Physical Activity Exercise 1-2 times per week    Medications take my medication as prescribed    Monitoring  Test my blood glucose  as discussed      Outcomes   Program Status Not Completed      Subsequent Visit   Since your last visit have you continued or begun to take your medications as prescribed? Yes    Since your last visit have you had your blood pressure checked? Yes    Is your most recent blood pressure lower, unchanged, or higher since your last visit? Unchanged    Since your last visit have you experienced any weight changes? Loss    Weight Loss (lbs) 2    Since your last visit, are you checking your blood glucose at least once a day? Yes             Learning Objective:  Patient will have a greater understanding of diabetes self-management. Patient education plan is to attend individual and/or group sessions per assessed needs and concerns.   Plan:   Patient Instructions  - Aim to check blood sugar in the morning before breakfast daily.   - Reduce soda intake to no more than once a day.   - Aim to increase activity on the weekends for at least 30 minutes of cardio.  Expected Outcomes:  Demonstrated interest in learning but significant barriers to change  Education material provided: none  If problems or questions, patient to contact team via:  Phone and Email  Future DSME appointment: - 4-6 wks

## 2022-09-06 NOTE — Patient Instructions (Addendum)
-   Aim to check blood sugar in the morning before breakfast daily.   - Reduce soda intake to no more than once a day.   - Aim to increase activity on the weekends for at least 30 minutes of cardio.

## 2022-09-29 ENCOUNTER — Other Ambulatory Visit (HOSPITAL_COMMUNITY): Payer: Self-pay

## 2022-10-03 ENCOUNTER — Other Ambulatory Visit (HOSPITAL_COMMUNITY): Payer: Self-pay

## 2022-10-03 MED ORDER — JARDIANCE 10 MG PO TABS
10.0000 mg | ORAL_TABLET | Freq: Every day | ORAL | 1 refills | Status: DC
  Filled 2022-10-03: qty 90, 90d supply, fill #0
  Filled 2023-02-03: qty 90, 90d supply, fill #1

## 2022-10-12 ENCOUNTER — Encounter: Payer: 59 | Attending: Physician Assistant | Admitting: Registered"

## 2022-10-12 ENCOUNTER — Encounter: Payer: Self-pay | Admitting: Registered"

## 2022-10-12 DIAGNOSIS — E1169 Type 2 diabetes mellitus with other specified complication: Secondary | ICD-10-CM | POA: Insufficient documentation

## 2022-10-12 NOTE — Patient Instructions (Addendum)
-   Aim to increase activity on the weekends for at least 30 minutes of cardio.  - Aim to check blood sugar in the morning before breakfast daily.

## 2022-10-12 NOTE — Progress Notes (Signed)
Diabetes Self-Management Education  Visit Type:  Follow-up  Appt. Start Time: 8:13 Appt. End Time: 8:45  10/12/2022  [name], identified by name and date of birth, is a 61 y.o. male with a diagnosis of Diabetes:  .   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   States he hasn't been able to check BS in the mornings. States it hasn't been on his mind.  States he drinks more water now than before including flavored seltzer water. States he is not drinking any sodas.  States he has not had motivation to go to gym like he used to. Discussed mental health and diabetes distress with option of seeing a mental health provider. Pt states he is not open to it at this time. Reports not being stressed currently.   There were no vitals taken for this visit. There is no height or weight on file to calculate BMI.    Diabetes Self-Management Education - 75/10/25 8527       Complications   How often do you check your blood sugar? 0 times/day (not testing)      Activity / Exercise   Activity / Exercise Type ADL's    How many days per week do you exercise? 0    How many minutes per day do you exercise? 0    Total minutes per week of exercise 0      Individualized Goals (developed by patient)   Physical Activity Exercise 1-2 times per week    Monitoring  Test my blood glucose as discussed    Problem Solving Addressing barriers to behavior change      Outcomes   Program Status Not Completed             Learning Objective:  Patient will have a greater understanding of diabetes self-management. Patient education plan is to attend individual and/or group sessions per assessed needs and concerns.   Plan:   Patient Instructions  - Aim to increase activity on the weekends for at least 30 minutes of cardio.  - Aim to check blood sugar in the morning before breakfast daily.    Expected Outcomes:  Demonstrated interest in learning but  significant barriers to change  Education material provided: none  If problems or questions, patient to contact team via:  Phone and Email  Future DSME appointment: - 4-6 wks

## 2022-10-19 ENCOUNTER — Other Ambulatory Visit: Payer: Self-pay | Admitting: Cardiology

## 2022-10-19 ENCOUNTER — Other Ambulatory Visit (HOSPITAL_COMMUNITY): Payer: Self-pay

## 2022-10-19 DIAGNOSIS — I10 Essential (primary) hypertension: Secondary | ICD-10-CM

## 2022-10-19 DIAGNOSIS — I429 Cardiomyopathy, unspecified: Secondary | ICD-10-CM

## 2022-10-20 ENCOUNTER — Other Ambulatory Visit (HOSPITAL_COMMUNITY): Payer: Self-pay

## 2022-10-20 MED ORDER — SPIRONOLACTONE 25 MG PO TABS
25.0000 mg | ORAL_TABLET | Freq: Every morning | ORAL | 0 refills | Status: DC
  Filled 2022-10-20: qty 90, 90d supply, fill #0

## 2022-10-20 MED ORDER — CARVEDILOL 25 MG PO TABS
25.0000 mg | ORAL_TABLET | Freq: Two times a day (BID) | ORAL | 0 refills | Status: DC
  Filled 2022-10-20: qty 180, 90d supply, fill #0

## 2022-10-22 ENCOUNTER — Other Ambulatory Visit (HOSPITAL_COMMUNITY): Payer: Self-pay

## 2022-10-22 MED ORDER — ATORVASTATIN CALCIUM 20 MG PO TABS
20.0000 mg | ORAL_TABLET | Freq: Every day | ORAL | 1 refills | Status: DC
  Filled 2022-10-22: qty 90, 90d supply, fill #0
  Filled 2023-02-22: qty 90, 90d supply, fill #1

## 2022-10-24 ENCOUNTER — Other Ambulatory Visit (HOSPITAL_COMMUNITY): Payer: Self-pay

## 2022-11-08 ENCOUNTER — Other Ambulatory Visit: Payer: Self-pay | Admitting: Cardiology

## 2022-11-08 DIAGNOSIS — I429 Cardiomyopathy, unspecified: Secondary | ICD-10-CM

## 2022-11-08 DIAGNOSIS — G4733 Obstructive sleep apnea (adult) (pediatric): Secondary | ICD-10-CM

## 2022-11-09 ENCOUNTER — Other Ambulatory Visit (HOSPITAL_COMMUNITY): Payer: Self-pay

## 2022-11-09 MED ORDER — ENTRESTO 97-103 MG PO TABS
1.0000 | ORAL_TABLET | Freq: Two times a day (BID) | ORAL | 0 refills | Status: DC
  Filled 2022-11-09: qty 180, 90d supply, fill #0

## 2022-11-16 ENCOUNTER — Encounter: Payer: Self-pay | Admitting: Registered"

## 2022-11-16 ENCOUNTER — Encounter: Payer: 59 | Attending: Physician Assistant | Admitting: Registered"

## 2022-11-16 DIAGNOSIS — E119 Type 2 diabetes mellitus without complications: Secondary | ICD-10-CM | POA: Diagnosis not present

## 2022-11-16 NOTE — Patient Instructions (Addendum)
-   Record blood pressure and blood glucose numbers in a small notebook daily.

## 2022-11-16 NOTE — Progress Notes (Signed)
  Diabetes Self-Management Education  Visit Type:  Follow-up  Appt. Start Time: 8:06 Appt. End Time: 8:34  11/16/2022  [name], identified by name and date of birth, is a 61 y.o. male with a diagnosis of Diabetes:  .   ASSESSMENT  States he and his wife just bought a house and moved over the weekend. States he handles his own grocery shopping and cooking. Reports wife is vegan and they don't eat the same foods. 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 States he is trying to eat more green beans and peas. States he is drinking a gallon of water a day or more. States he only drinks soda once every other day; about 10 oz each time.   Reports his blood pressure has been around 135/88. States he is checking BG every other day but does not recall readings.   States he walks daily and has also been doing yardwork the last couple of days. States he would walk around the block twice/week for 30 minutes but has not walked his new neighborhood. States his right knee has been given him challenges with walking and holds him back. States he knows he needs to get it checked out but hasn't yet.   There were no vitals taken for this visit. There is no height or weight on file to calculate BMI.    Diabetes Self-Management Education - 11/16/22 0821       Dietary Intake   Breakfast skipped    Lunch [employer] - 6-8" [place] sub (with [place] bread, mayo) + chips + water    Dinner fish stick sandwich + water    Beverage(s) water      Activity / Exercise   Activity / Exercise Type ADL's    How many days per week do you exercise? 1    How many minutes per day do you exercise? 30    Total minutes per week of exercise 30      Subsequent Visit   Since your last visit have you had your blood pressure checked? Yes    Is your most recent blood pressure lower, unchanged, or higher since your last visit? Lower    Since your last visit, are you checking your blood glucose at least once a day? No              Learning Objective:  Patient will have a greater understanding of diabetes self-management. Patient education plan is to attend individual and/or group sessions per assessed needs and concerns.   Plan:   Patient Instructions  - Record blood pressure and blood glucose numbers in a small notebook daily.      Expected Outcomes:     Education material provided: none  If problems or questions, patient to contact team via:  Phone and Email  Future DSME appointment: -

## 2022-12-27 ENCOUNTER — Encounter: Payer: Self-pay | Admitting: Registered"

## 2022-12-27 ENCOUNTER — Encounter: Payer: Commercial Managed Care - PPO | Attending: Physician Assistant | Admitting: Registered"

## 2022-12-27 DIAGNOSIS — E119 Type 2 diabetes mellitus without complications: Secondary | ICD-10-CM | POA: Diagnosis not present

## 2022-12-27 NOTE — Patient Instructions (Signed)
-   Aim to have at least 1 fruit and 1 vegetable a day.

## 2022-12-27 NOTE — Progress Notes (Signed)
  Diabetes Self-Management Education  Visit Type: Follow-up  Appt. Start Time: 9:03  Appt. End Time: 9:14  12/28/2022  [name], identified by name and date of birth, is a 62 y.o. male with a diagnosis of Diabetes:  .   ASSESSMENT  Pt arrives stating he needs to leave soon from today's appt. States he has not done anything since previous visit. States this year will be different and plans to be more intentional. States he knows he has to do better and will do it.  States he has been cooking more and drinking more water. Reports drinking a gallon of water daily. Started eating fruits last week: bananas and apples.   Reports BP has been 145/97. States this is lower than what it has been.Reports having an upcoming appt with cardiologist   There were no vitals taken for this visit. There is no height or weight on file to calculate BMI.   Diabetes Self-Management Education - 12/27/22 0900       Visit Information   Visit Type Follow-up      Dietary Intake   Lunch [employer] - medium french fries    Snack (afternoon) Ritz crackers    Beverage(s) water      Outcomes   Expected Outcomes Demonstrated limited interest in learning.  Expect minimal changes    Future DMSE 4-6 wks    Program Status Not Completed      Subsequent Visit   Since your last visit have you had your blood pressure checked? Yes    Is your most recent blood pressure lower, unchanged, or higher since your last visit? Lower    Since your last visit, are you checking your blood glucose at least once a day? No             Individualized Plan for Diabetes Self-Management Training:   Learning Objective:  Patient will have a greater understanding of diabetes self-management. Patient education plan is to attend individual and/or group sessions per assessed needs and concerns.   Plan:   Patient Instructions  - Aim to have at least 1 fruit and 1 vegetable a day.    Expected Outcomes:  Demonstrated  limited interest in learning.  Expect minimal changes  Education material provided: none  If problems or questions, patient to contact team via:  Phone and Email  Future DSME appointment: 4-6 wks

## 2022-12-28 ENCOUNTER — Ambulatory Visit: Payer: Commercial Managed Care - PPO | Admitting: Cardiology

## 2023-01-06 ENCOUNTER — Encounter: Payer: Self-pay | Admitting: Cardiology

## 2023-01-06 ENCOUNTER — Ambulatory Visit: Payer: Commercial Managed Care - PPO | Admitting: Cardiology

## 2023-01-06 VITALS — BP 120/87 | HR 59 | Resp 16 | Ht 67.0 in | Wt 235.8 lb

## 2023-01-06 DIAGNOSIS — I2510 Atherosclerotic heart disease of native coronary artery without angina pectoris: Secondary | ICD-10-CM

## 2023-01-06 DIAGNOSIS — E781 Pure hyperglyceridemia: Secondary | ICD-10-CM | POA: Diagnosis not present

## 2023-01-06 DIAGNOSIS — E119 Type 2 diabetes mellitus without complications: Secondary | ICD-10-CM

## 2023-01-06 DIAGNOSIS — G4733 Obstructive sleep apnea (adult) (pediatric): Secondary | ICD-10-CM

## 2023-01-06 DIAGNOSIS — I10 Essential (primary) hypertension: Secondary | ICD-10-CM

## 2023-01-06 DIAGNOSIS — Z6836 Body mass index (BMI) 36.0-36.9, adult: Secondary | ICD-10-CM | POA: Diagnosis not present

## 2023-01-06 DIAGNOSIS — I429 Cardiomyopathy, unspecified: Secondary | ICD-10-CM | POA: Diagnosis not present

## 2023-01-06 DIAGNOSIS — E782 Mixed hyperlipidemia: Secondary | ICD-10-CM

## 2023-01-06 MED ORDER — ASPIRIN 81 MG PO TBEC: 81.0000 mg | DELAYED_RELEASE_TABLET | Freq: Every day | ORAL | Status: AC

## 2023-01-06 NOTE — Progress Notes (Signed)
Date:  01/06/2023   ID:  Thomas Yoder, DOB [birth year], MRN [id number]  PCP:  Everardo Beals, NP  Cardiologist:  Rex Kras, DO, [facility]  (established care 11/03/2021) Former Cardiology Providers: Dr. Fransico Him   Date: 01/06/23 Last Office Visit: 06/27/2022  Chief Complaint  Patient presents with   Congestive Heart Failure    management   Follow-up    6 months    HPI  Thomas Yoder is a 62 y.o. African-American male whose past medical history and cardiovascular risk factors include: Diabetes, hypertension, intermittent noncompliance, tobacco use, snoring, recovered nonischemic cardiomyopathy, nonobstructive CAD by CT 11/2018, former smoker, noncompliance, obesity due to excess calories.  Patient presents today for 69-month follow-up visit for management of recovered cardiomyopathy and benign essential hypertension.  Patient was noted to have an LVEF of 40 to 45% likely secondary to nonischemic cardiomyopathy.  After up titration of medical therapy repeat echocardiogram noted LVEF of 55 to 60%.    Over the last 6 months patient has done well from a cardiovascular standpoint.  He denies any anginal discomfort or heart failure symptoms.  He is implemented lifestyle changes and has lost 10 pounds since last office visit.  He is congratulated on his efforts.  Patient also states that the blood pressures at home have improved.  He is compliant with his CPAP.  He is due for yearly well visit with PCP and will have labs done.  Of note, since last visit he has bought a house for which she is congratulated for as well.  FUNCTIONAL STATUS: No structured exercise program or daily routine. Average steps per day 18,000-19,000.    ALLERGIES: No Known Allergies  MEDICATION LIST PRIOR TO VISIT: Current Meds  Medication Sig   acetaminophen (TYLENOL) 500 MG tablet Take 1,000 mg by mouth every 6 (six) hours as needed for moderate pain.   atorvastatin (LIPITOR) 20 MG tablet Take 1 tablet (20  mg total) by mouth daily.   carvedilol (COREG) 25 MG tablet Take 1 tablet (25 mg total) by mouth 2 (two) times daily.   empagliflozin (JARDIANCE) 10 MG TABS tablet Take 1 tablet (10 mg total) by mouth daily.   fenofibrate (TRICOR) 145 MG tablet Take 1 tablet (145 mg total) by mouth daily.   metFORMIN (GLUCOPHAGE) 500 MG tablet Take 1 tablet (500 mg total) by mouth 2 (two) times daily.   sacubitril-valsartan (ENTRESTO) 97-103 MG Take 1 tablet by mouth 2 (two) times daily.   spironolactone (ALDACTONE) 25 MG tablet Take 1 tablet (25 mg total) by mouth every morning.     PAST MEDICAL HISTORY: Past Medical History:  Diagnosis Date   CAD (coronary artery disease)    a. Cardiac CT showed calcium score of 83rd percentile, mild stenosis in the proximal LCx and the proximal and mid RCA, negative FFR for hemodynamic significance.   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%, most recent 20 to 60%)   Hyperlipidemia    Hypertension    Obesity (BMI 30-39.9)    OSA (obstructive sleep apnea) 07/24/2019   uses cpap   Phimosis    Snoring    TYPE 2Diabetes mellitus without complication ([place])     PAST SURGICAL HISTORY: Past Surgical History:  Procedure Laterality Date   CIRCUMCISION N/A 08/23/2022   Procedure: CIRCUMCISION ADULT;  Surgeon: Remi Haggard, MD;  Location: [facility];  Service: Urology;  Laterality: N/A;  45 MINS   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  SOCIAL HISTORY:  The patient  reports that he quit smoking about 5 years ago. His smoking use included cigarettes. He has a 17.00 pack-year smoking history. He has never used smokeless tobacco. He reports that he does not currently use drugs after having used the following drugs: Marijuana. He reports that he does not drink alcohol.  REVIEW OF SYSTEMS: Review of Systems  Cardiovascular:  Negative for chest pain, dyspnea on exertion, leg swelling, near-syncope, orthopnea, palpitations, paroxysmal nocturnal dyspnea and syncope.  Respiratory:  Positive for snoring. Negative for shortness of breath.   Hematologic/Lymphatic: Negative for bleeding problem.  Musculoskeletal:  Positive for joint pain.  Neurological:  Negative for dizziness and light-headedness.   PHYSICAL EXAM:    01/06/2023   10:15 AM 08/23/2022    4:40 PM 08/23/2022    4:00 PM  Vitals with BMI  Height 5\' 7"     Weight 235 lbs 13 oz    BMI 35.57    Systolic 322 025 427  Diastolic 87 94 83  Pulse 59 60 63    Physical Exam  Constitutional: No distress.  Age appropriate, hemodynamically stable.   Neck: No JVD present.  Cardiovascular: Normal rate, regular rhythm, S1 normal, S2 normal, intact distal pulses and normal pulses. Exam reveals no gallop, no S3 and no S4.  No murmur heard. Pulmonary/Chest: Effort normal and breath sounds normal. No stridor. He has no wheezes. He has no rales.  Abdominal: Soft. Bowel sounds are normal. He exhibits no distension. There is no abdominal tenderness.  Musculoskeletal:        General: No edema.     Cervical back: Neck supple.  Neurological: He is alert and oriented to person, place, and time. He has intact cranial nerves (2-12).  Skin: Skin is warm and moist.   CARDIAC DATABASE: EKG: 01/06/2023: Sinus rhythm, 69 bpm, without underlying ischemia or injury pattern.  Echocardiogram: 11/2018: LVEF 40-45%,  grade 1 diastolic impairment, mild LAE, PASP 33 mmHg.   11/22/2021: LVEF  55-60%, moderate LVH, no significant valvular heart disease.  Stress Testing: Myocardial perfusion imaging study 03/31/2014: Moderate size, mild ischemia in the inferior wall extending from the base towards the apex.  Inferior wall hypokinesis, LVEF 49% scan study.  Heart Catheterization: None  CCTA  11/27/2018: Coronary artery calcium score 157 Agatston units. This places the patient in the 83rd percentile. Mild stenosis in the proximal LCx and the proximal and mid RCA. FFR 0.82 distal RCA FFR 0.85 mid LCx No evidence for hemodynamically significant stenosis.  LABORATORY DATA: External Labs:  Date Collected: 09/23/2021 , information obtained by [facility] Potassium: 4.4 Creatinine 1.01 mg/dL. eGFR: 86 mL/min per 1.73 m Hemoglobin: 14.3 g/dL and hematocrit: 45.2 % Lipid profile: Total cholesterol 209 , triglycerides 451 , HDL 34 , non-HDL 175 AST: 17 , ALT: 23 , alkaline phosphatase: 119  BNP 40 Hemoglobin A1c: 6.6 TSH: 2.21    External Labs: Collected: March 03, 2022 performed at PCPs office available in [place]. TSH 1.75. Hemoglobin A1c 6.9. Hemoglobin 13.9 g/dL,  hematocrit 43.6%. AST 14, ALT 18, alkaline phosphatase 72. Sodium 143, potassium 4.4, chloride 105, bicarb 28. BUN 19, creatinine 1.22. eGFR 68. Total cholesterol 151, HDL 41, triglycerides 134, LDL 87, non-HDL 110   IMPRESSION:    ICD-10-CM   1. Recovered cardiomyopathy  I42.9 EKG 12-Lead    2. Essential hypertension, benign  I10     3. Nonobstructive atherosclerosis of coronary artery  I25.10 EKG 12-Lead    aspirin EC 81 MG tablet    4. Non-insulin dependent type 2 diabetes mellitus (HCC)  E11.9     5. Mixed hyperlipidemia  E78.2     6. Hypertriglyceridemia  E78.1     7. OSA (obstructive sleep apnea)  G47.33     8. Class 2 severe obesity due to excess calories with serious comorbidity and body mass index (BMI) of 36.0 to 36.9 in adult [facility])  E66.01    Z68.36         RECOMMENDATIONS: Wilmon Conover is a 62 y.o. 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  Recovered cardiomyopathy Stage C, NYHA class II/III. Overall euvolemic. Echo: 55 to 60%, normal diastolic function, no significant valvular heart disease. Unable to tolerate BiDil due to headaches. Patient has lost approximately 10 pounds since last office visit. Office blood pressures are very well-controlled. Medications reconciled. Will hold off on up titration of GDMT as his overall clinical status is stable.  Essential hypertension, benign Office blood pressures are well-controlled. Medications reconciled. No changes warranted at this time  Nonobstructive atherosclerosis of coronary artery Total CAC 157, 83rd percentile. Noted to have mild stenosis in the LCx/RCA distribution not hemodynamically significant per CT FFR. Reemphasized the importance of aspirin 81 mg p.o. daily. Continue statin therapy. He will have repeat labs with PCP in the coming weeks.  Send [place] a copy for reference.  Non-insulin dependent type 2 diabetes mellitus (HCC) Currently on Arni, statin, Jardiance therapy.  Mixed hyperlipidemia Currently on atorvastatin. Does not endorse myalgias or other side effects. Most recent lipid profile independently reviewed and noted above for further reference.  Hypertriglyceridemia Improved. Currently on fenofibrate. Currently managed by primary care provider.  OSA (obstructive sleep apnea) Patient states that he is currently using CPAP on a regular basis.  Reemphasized the importance of compliance.  FINAL MEDICATION LIST END OF ENCOUNTER: Meds ordered this encounter  Medications   aspirin EC 81 MG tablet    Sig: Take 1 tablet  (81 mg total) by mouth daily.    Dispense:  30 tablet     Medications Discontinued During This Encounter  Medication Reason   traMADol (ULTRAM) 50 MG tablet Patient Preference   aspirin EC 81 MG EC tablet Reorder      Current Outpatient Medications:    acetaminophen (TYLENOL) 500 MG tablet, Take 1,000 mg by mouth every 6 (six) hours as needed for moderate pain., Disp: , Rfl:    atorvastatin (LIPITOR) 20 MG tablet, Take 1 tablet (20 mg total) by mouth daily., Disp: 90 tablet, Rfl: 1   carvedilol (COREG) 25 MG tablet, Take 1 tablet (25 mg total) by mouth 2 (two) times daily., Disp: 180 tablet, Rfl: 0   empagliflozin (JARDIANCE) 10 MG TABS tablet, Take 1 tablet (10 mg total) by mouth daily., Disp: 90 tablet, Rfl: 1   fenofibrate (TRICOR) 145 MG tablet, Take 1 tablet (145 mg total)  by mouth daily., Disp: 90 tablet, Rfl: 1   metFORMIN (GLUCOPHAGE) 500 MG tablet, Take 1 tablet (500 mg total) by mouth 2 (two) times daily., Disp: 180 tablet, Rfl: 1   sacubitril-valsartan (ENTRESTO) 97-103 MG, Take 1 tablet by mouth 2 (two) times daily., Disp: 180 tablet, Rfl: 0   spironolactone (ALDACTONE) 25 MG tablet, Take 1 tablet (25 mg total) by mouth every morning., Disp: 90 tablet, Rfl: 0   aspirin EC 81 MG tablet, Take 1 tablet (81 mg total) by mouth daily., Disp: 30 tablet, Rfl:   Orders Placed This Encounter  Procedures   EKG 12-Lead    There are no Patient Instructions on file for this visit.   --Continue cardiac medications as reconciled in final medication list. --Return in about 6 months (around 07/07/2023) for Follow up recovered cardiomyopathy, nonobstructive CAD. Or sooner if needed. --Continue follow-up with your primary care physician regarding the management of your other chronic comorbid conditions.  Patient's questions and concerns were addressed to his satisfaction. He voices understanding of the instructions provided during this encounter.   This note was created using a voice  recognition software as a result there may be grammatical errors inadvertently enclosed that do not reflect the nature of this encounter. Every attempt is made to correct such errors.  Tessa Lerner, [place], [facility]  Pager: [phone] Office: [phone]

## 2023-01-25 ENCOUNTER — Ambulatory Visit: Payer: Commercial Managed Care - PPO | Admitting: Registered"

## 2023-02-03 ENCOUNTER — Other Ambulatory Visit (HOSPITAL_COMMUNITY): Payer: Self-pay

## 2023-02-03 ENCOUNTER — Other Ambulatory Visit: Payer: Self-pay | Admitting: Cardiology

## 2023-02-03 DIAGNOSIS — I429 Cardiomyopathy, unspecified: Secondary | ICD-10-CM

## 2023-02-03 DIAGNOSIS — I10 Essential (primary) hypertension: Secondary | ICD-10-CM

## 2023-02-03 MED ORDER — SPIRONOLACTONE 25 MG PO TABS
25.0000 mg | ORAL_TABLET | Freq: Every morning | ORAL | 0 refills | Status: DC
  Filled 2023-02-03: qty 90, 90d supply, fill #0

## 2023-02-16 ENCOUNTER — Ambulatory Visit: Payer: Commercial Managed Care - PPO | Admitting: Medical

## 2023-02-16 ENCOUNTER — Encounter: Payer: Self-pay | Admitting: Medical

## 2023-02-16 VITALS — BP 138/76 | HR 76 | Resp 18 | Ht 67.0 in | Wt 232.0 lb

## 2023-02-16 DIAGNOSIS — E785 Hyperlipidemia, unspecified: Secondary | ICD-10-CM | POA: Diagnosis not present

## 2023-02-16 DIAGNOSIS — Z1211 Encounter for screening for malignant neoplasm of colon: Secondary | ICD-10-CM | POA: Diagnosis not present

## 2023-02-16 DIAGNOSIS — I10 Essential (primary) hypertension: Secondary | ICD-10-CM

## 2023-02-16 DIAGNOSIS — I5042 Chronic combined systolic (congestive) and diastolic (congestive) heart failure: Secondary | ICD-10-CM

## 2023-02-16 DIAGNOSIS — E119 Type 2 diabetes mellitus without complications: Secondary | ICD-10-CM | POA: Diagnosis not present

## 2023-02-16 DIAGNOSIS — Z0000 Encounter for general adult medical examination without abnormal findings: Secondary | ICD-10-CM | POA: Diagnosis not present

## 2023-02-16 LAB — COMPREHENSIVE METABOLIC PANEL
ALT: 15 U/L (ref 0–53)
AST: 13 U/L (ref 0–37)
Albumin: 4.2 g/dL (ref 3.5–5.2)
Alkaline Phosphatase: 63 U/L (ref 39–117)
BUN: 19 mg/dL (ref 6–23)
CO2: 26 mEq/L (ref 19–32)
Calcium: 10.3 mg/dL (ref 8.4–10.5)
Chloride: 106 mEq/L (ref 96–112)
Creatinine, Ser: 0.93 mg/dL (ref 0.40–1.50)
GFR: 89.08 mL/min (ref >60.00)
Glucose, Bld: 78 mg/dL (ref 70–99)
Potassium: 4.4 mEq/L (ref 3.5–5.1)
Sodium: 141 mEq/L (ref 135–145)
Total Bilirubin: 0.5 mg/dL (ref 0.2–1.2)
Total Protein: 6.7 g/dL (ref 6.0–8.3)

## 2023-02-16 LAB — CBC WITH DIFFERENTIAL/PLATELET
Basophils Absolute: 0 10*3/uL (ref 0.0–0.1)
Basophils Relative: 0.6 % (ref 0.0–3.0)
Eosinophils Absolute: 0.2 10*3/uL (ref 0.0–0.7)
Eosinophils Relative: 3.6 % (ref 0.0–5.0)
HCT: 45.5 % (ref 39.0–52.0)
Hemoglobin: 14.7 g/dL (ref 13.0–17.0)
Lymphocytes Relative: 47.2 % — ABNORMAL HIGH (ref 12.0–46.0)
Lymphs Abs: 2 10*3/uL (ref 0.7–4.0)
MCHC: 32.3 g/dL (ref 30.0–36.0)
MCV: 80.1 fl (ref 78.0–100.0)
Monocytes Absolute: 0.3 10*3/uL (ref 0.1–1.0)
Monocytes Relative: 6 % (ref 3.0–12.0)
Neutro Abs: 1.8 10*3/uL (ref 1.4–7.7)
Neutrophils Relative %: 42.6 % — ABNORMAL LOW (ref 43.0–77.0)
Platelets: 244 10*3/uL (ref 150.0–400.0)
RBC: 5.68 Mil/uL (ref 4.22–5.81)
RDW: 16.1 % — ABNORMAL HIGH (ref 11.5–15.5)
WBC: 4.2 10*3/uL (ref 4.0–10.5)

## 2023-02-16 LAB — PSA: PSA: 2.33 ng/mL (ref 0.10–4.00)

## 2023-02-16 LAB — LIPID PANEL
Cholesterol: 123 mg/dL (ref 0–200)
HDL: 36.5 mg/dL — ABNORMAL LOW (ref >39.00)
LDL Cholesterol: 54 mg/dL (ref 0–99)
NonHDL: 86.13
Total CHOL/HDL Ratio: 3
Triglycerides: 161 mg/dL — ABNORMAL HIGH (ref 0.0–149.0)
VLDL: 32.2 mg/dL (ref 0.0–40.0)

## 2023-02-16 LAB — HEMOGLOBIN A1C: Hgb A1c MFr Bld: 6.5 % (ref 4.6–6.5)

## 2023-02-16 NOTE — Patient Instructions (Addendum)
For you wellness exam today I have ordered cbc, cmp,psa and lipid panel.   Pt declined shingrix vaccines.  Recommend exercise and healthy diet.  Refer to GI MD for screening colonoscopy.(Ask you call their office directly)  We will let you know lab results as they come in.  Follow up date appointment will be determined after lab review.    Pt is diabetic- 6 months ago A1c was 6.9. Metformin  500 mg twice a day, jardiance 10 mg daily and metformin 500 mg bid.  Get A1c today  Htn- pt in carvedilol and spirinolaction. Bp controlled toay  CHF -Was on entresto 97-103 bid. Refer to cardiologist for evaluation and tx.  High cholesterol- atovastain 20 mg daily.  Preventive Care 49-56 Years Old, Male Preventive care refers to lifestyle choices and visits with your health care provider that can promote health and wellness. Preventive care visits are also called wellness exams. What can I expect for my preventive care visit? Counseling During your preventive care visit, your health care provider may ask about your: Medical history, including: Past medical problems. Family medical history. Current health, including: Emotional well-being. Home life and relationship well-being. Sexual activity. Lifestyle, including: Alcohol, nicotine or tobacco, and drug use. Access to firearms. Diet, exercise, and sleep habits. Safety issues such as seatbelt and bike helmet use. Sunscreen use. Work and work Statistician. Physical exam Your health care provider will check your: Height and weight. These may be used to calculate your BMI (body mass index). BMI is a measurement that tells if you are at a healthy weight. Waist circumference. This measures the distance around your waistline. This measurement also tells if you are at a healthy weight and may help predict your risk of certain diseases, such as type 2 diabetes and high blood pressure. Heart rate and blood pressure. Body temperature. Skin for  abnormal spots. What immunizations do I need?  Vaccines are usually given at various ages, according to a schedule. Your health care provider will recommend vaccines for you based on your age, medical history, and lifestyle or other factors, such as travel or where you work. What tests do I need? Screening Your health care provider may recommend screening tests for certain conditions. This may include: Lipid and cholesterol levels. Diabetes screening. This is done by checking your blood sugar (glucose) after you have not eaten for a while (fasting). Hepatitis B test. Hepatitis C test. HIV (human immunodeficiency virus) test. STI (sexually transmitted infection) testing, if you are at risk. Lung cancer screening. Prostate cancer screening. Colorectal cancer screening. Talk with your health care provider about your test results, treatment options, and if necessary, the need for more tests. Follow these instructions at home: Eating and drinking  Eat a diet that includes fresh fruits and vegetables, whole grains, lean protein, and low-fat dairy products. Take vitamin and mineral supplements as recommended by your health care provider. Do not drink alcohol if your health care provider tells you not to drink. If you drink alcohol: Limit how much you have to 0-2 drinks a day. Know how much alcohol is in your drink. In the [place] one drink equals one 12 oz bottle of beer (355 mL), one 5 oz glass of wine (148 mL), or one 1 oz glass of hard liquor (44 mL). Lifestyle Brush your teeth every morning and night with fluoride toothpaste. Floss one time each day. Exercise for at least 30 minutes 5 or more days each week. Do not use any products that contain nicotine  or tobacco. These products include cigarettes, chewing tobacco, and vaping devices, such as e-cigarettes. If you need help quitting, ask your health care provider. Do not use drugs. If you are sexually active, practice safe sex. Use a  condom or other form of protection to prevent STIs. Take aspirin only as told by your health care provider. Make sure that you understand how much to take and what form to take. Work with your health care provider to find out whether it is safe and beneficial for you to take aspirin daily. Find healthy ways to manage stress, such as: Meditation, yoga, or listening to music. Journaling. Talking to a trusted person. Spending time with friends and family. Minimize exposure to UV radiation to reduce your risk of skin cancer. Safety Always wear your seat belt while driving or riding in a vehicle. Do not drive: If you have been drinking alcohol. Do not ride with someone who has been drinking. When you are tired or distracted. While texting. If you have been using any mind-altering substances or drugs. Wear a helmet and other protective equipment during sports activities. If you have firearms in your house, make sure you follow all gun safety procedures. What's next? Go to your health care provider once a year for an annual wellness visit. Ask your health care provider how often you should have your eyes and teeth checked. Stay up to date on all vaccines. This information is not intended to replace advice given to you by your health care provider. Make sure you discuss any questions you have with your health care provider. Document Revised: 06/02/2021 Document Reviewed: 06/02/2021 Elsevier Patient Education  [place].

## 2023-02-16 NOTE — Progress Notes (Signed)
Subjective:    Patient ID: Thomas Yoder, male    DOB: 11-08-1961, 62 y.o.   MRN: [id number]  HPI  Pt in for first time.  Pt works [employer]. He states at time will walk 18,000 steps a day. Pt admits no eating healthy. No vegetables. Admits to carbs and meat. States not eating out as much as has in the past. Non smoker currently but former smoker quit in 2019. He smoked for 20 years but only a pack a month. No alcohol.  Pt is diabetic- 6 months ago A1c was 6.9. Metformin  500 mg twice a day, jardiance 10 mg daily and metformin 500 mg bid.   Htn- pt in carvedilol and spirinolaction. Was on entresto 97-103 bid. He thinks was told to ger off of in the past though not sure.  High cholesterol- atovastain 20 mg daily.    On review pt prior cardiologist note is below.  Recovered cardiomyopathy Stage C, NYHA class II/III. Overall euvolemic. Echo: 55 to 123456, normal diastolic function, no significant valvular heart disease. Unable to tolerate BiDil due to headaches. Medications reconciled with his wife over the phone. We will continue to monitor.   Essential hypertension, benign In the past patient requested assistance with med blood pressure management. Office blood pressures are well controlled. Recent labs from 06/23/2022 independently reviewed and noted above for further reference.   Nonobstructive atherosclerosis of coronary artery Total CAC 157, 83rd percentile. Noted to have mild stenosis in the LCx/RCA distribution not hemodynamically significant per CT FFR. Currently denies angina pectoris. Currently on aspirin and statin therapy.   Review of Systems  Constitutional:  Negative for chills, fatigue and fever.  HENT:  Negative for congestion and drooling.   Respiratory:  Negative for cough, chest tightness, shortness of breath and wheezing.   Cardiovascular:  Negative for chest pain and palpitations.  Gastrointestinal:  Negative for abdominal pain,  blood in stool, diarrhea, rectal pain and vomiting.  Genitourinary:  Negative for dysuria and frequency.  Musculoskeletal:  Negative for back pain.  Skin:  Negative for rash.    Past Medical History:  Diagnosis Date   CAD (coronary artery disease)    a. Cardiac CT showed calcium score of 83rd percentile, mild stenosis in the proximal LCx and the proximal and mid RCA, negative FFR for hemodynamic significance.   Chronic combined systolic and diastolic CHF (congestive heart failure) ([place]) 12/11/2018   Depression    Former tobacco use    GERD (gastroesophageal reflux disease)    H/O noncompliance with medical treatment, presenting hazards to health    history of NICM (nonischemic cardiomyopathy) ([place])    Recovered cardiomyopathy (LVEF 45-50%, most recent 20 to 60%)   Hyperlipidemia    Hypertension    Obesity (BMI 30-39.9)    OSA (obstructive sleep apnea) 07/24/2019   uses cpap   Phimosis    Snoring    TYPE 2Diabetes mellitus without complication ([place])      Social History   Socioeconomic History   Marital status: Married    Spouse name: Not on file   Number of children: 3   Years of education: Not on file   Highest education level: Not on file  Occupational History   Not on file  Tobacco Use   Smoking status: Former    Packs/day: 0.50    Years: 34.00    Total pack years: 17.00    Types: Cigarettes    Quit date: 2019    Years since  quitting: 5.1   Smokeless tobacco: Never   Tobacco comments:    2-3 weeks  Vaping Use   Vaping Use: Never used  Substance and Sexual Activity   Alcohol use: No   Drug use: Not Currently    Types: Marijuana    Comment: 08-15-22   Sexual activity: Yes    Birth control/protection: Condom  Other Topics Concern   Not on file  Social History Narrative   Not on file   Social Determinants of Health   Financial Resource Strain: Not on file  Food Insecurity: Not on file  Transportation Needs: Not on file  Physical Activity: Not on file   Stress: Not on file  Social Connections: Not on file  Intimate Partner Violence: Not on file    Past Surgical History:  Procedure Laterality Date   CIRCUMCISION N/A 08/23/2022   Procedure: CIRCUMCISION ADULT;  Surgeon: Remi Haggard, MD;  Location: [facility];  Service: Urology;  Laterality: N/A;  45 MINS   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     aspirin EC 81 MG tablet Take 1 tablet (81 mg total) by mouth daily. 30 tablet    atorvastatin (LIPITOR) 20 MG tablet Take 1 tablet (20 mg total) by mouth daily. 90 tablet 1   carvedilol (COREG) 25 MG tablet Take 1 tablet (25 mg total) by mouth 2 (two) times daily. 180 tablet 0   empagliflozin (JARDIANCE) 10 MG TABS tablet Take 1 tablet (10 mg total) by mouth daily. 90 tablet 1   fenofibrate (TRICOR) 145 MG tablet Take 1 tablet (145 mg total) by mouth daily. 90 tablet 1   metFORMIN (GLUCOPHAGE) 500 MG tablet Take 1 tablet (500 mg total) by mouth 2 (two) times daily. 180 tablet 1   spironolactone (ALDACTONE) 25 MG tablet Take 1 tablet (25 mg total) by mouth every morning. 90 tablet 0   No current facility-administered medications on file prior to visit.    BP 138/76   Pulse 76   Resp 18   Ht '5\' 7"'$  (1.702 m)   Wt 232 lb (105.2 kg)   SpO2 99%   BMI 36.34 kg/m        Objective:   Physical Exam   General Mental Status- Alert. General Appearance- Not in acute distress.   Skin General: Color- Normal Color. Moisture-  Normal Moisture.  Neck Carotid Arteries- Normal color. Moisture- Normal Moisture. No carotid bruits. No JVD.  Chest and Lung Exam Auscultation: Breath Sounds:-Normal.  Cardiovascular Auscultation:Rythm- Regular. Murmurs & Other Heart Sounds:Auscultation of the heart reveals- No Murmurs.  Abdomen Inspection:-Inspeection Normal. Palpation/Percussion:Note:No mass. Palpation and Percussion of the abdomen reveal- Non Tender, Non Distended + BS, no rebound or guarding.   Neurologic Cranial Nerve exam:- CN III-XII intact(No nystagmus), symmetric smile. Strength:- 5/5 equal and symmetric strength both upper and lower extremities.      Assessment & Plan:  Patient Instructions  For you wellness exam today I have ordered cbc, cmp,psa and lipid panel.   Pt declined shingrix vaccines.  Recommend exercise and healthy diet.  Refer to GI MD for screening colonoscopy.  We will let you know lab results as they come in.  Follow up date appointment will be determined after lab review.    Pt is diabetic- 6 months ago A1c was 6.9. Metformin  500 mg twice a day, jardiance 10 mg daily and metformin 500 mg bid.  Get A1c today  Htn- pt in carvedilol and spirinolaction. Bp controlled toay  CHF -Was on entresto 97-103 bid. Refer to cardiologist for evaluation and tx.  High cholesterol- atovastain 20 mg daily.        Mackie Pai, PA-C

## 2023-02-22 ENCOUNTER — Other Ambulatory Visit: Payer: Self-pay | Admitting: Cardiology

## 2023-02-22 DIAGNOSIS — I10 Essential (primary) hypertension: Secondary | ICD-10-CM

## 2023-02-22 DIAGNOSIS — I429 Cardiomyopathy, unspecified: Secondary | ICD-10-CM

## 2023-02-23 ENCOUNTER — Other Ambulatory Visit (HOSPITAL_COMMUNITY): Payer: Self-pay

## 2023-02-23 ENCOUNTER — Other Ambulatory Visit: Payer: Self-pay

## 2023-02-23 MED ORDER — CARVEDILOL 25 MG PO TABS
25.0000 mg | ORAL_TABLET | Freq: Two times a day (BID) | ORAL | 0 refills | Status: DC
  Filled 2023-02-23: qty 180, 90d supply, fill #0

## 2023-03-12 ENCOUNTER — Other Ambulatory Visit: Payer: Self-pay | Admitting: Cardiology

## 2023-03-12 ENCOUNTER — Other Ambulatory Visit (HOSPITAL_COMMUNITY): Payer: Self-pay

## 2023-03-12 DIAGNOSIS — I429 Cardiomyopathy, unspecified: Secondary | ICD-10-CM

## 2023-03-12 DIAGNOSIS — G4733 Obstructive sleep apnea (adult) (pediatric): Secondary | ICD-10-CM

## 2023-03-13 ENCOUNTER — Other Ambulatory Visit (HOSPITAL_COMMUNITY): Payer: Self-pay

## 2023-03-13 MED ORDER — ENTRESTO 97-103 MG PO TABS
1.0000 | ORAL_TABLET | Freq: Two times a day (BID) | ORAL | 0 refills | Status: DC
  Filled 2023-03-13: qty 180, 90d supply, fill #0

## 2023-03-20 ENCOUNTER — Other Ambulatory Visit (HOSPITAL_COMMUNITY): Payer: Self-pay

## 2023-03-20 MED ORDER — FENOFIBRATE 145 MG PO TABS
145.0000 mg | ORAL_TABLET | Freq: Every day | ORAL | 1 refills | Status: AC
  Filled 2023-03-20: qty 90, 90d supply, fill #0
  Filled 2023-06-18: qty 90, 90d supply, fill #1

## 2023-03-21 DIAGNOSIS — I10 Essential (primary) hypertension: Secondary | ICD-10-CM | POA: Insufficient documentation

## 2023-03-27 ENCOUNTER — Ambulatory Visit: Payer: Commercial Managed Care - PPO | Attending: Cardiology | Admitting: Cardiology

## 2023-03-27 ENCOUNTER — Encounter: Payer: Self-pay | Admitting: Cardiology

## 2023-03-27 VITALS — BP 110/80 | HR 61 | Ht 67.0 in | Wt 234.0 lb

## 2023-03-27 DIAGNOSIS — I5042 Chronic combined systolic (congestive) and diastolic (congestive) heart failure: Secondary | ICD-10-CM | POA: Diagnosis not present

## 2023-03-27 DIAGNOSIS — I10 Essential (primary) hypertension: Secondary | ICD-10-CM | POA: Diagnosis not present

## 2023-03-27 DIAGNOSIS — E1169 Type 2 diabetes mellitus with other specified complication: Secondary | ICD-10-CM | POA: Diagnosis not present

## 2023-03-27 DIAGNOSIS — G4733 Obstructive sleep apnea (adult) (pediatric): Secondary | ICD-10-CM

## 2023-03-27 DIAGNOSIS — R0683 Snoring: Secondary | ICD-10-CM | POA: Diagnosis not present

## 2023-03-27 DIAGNOSIS — E669 Obesity, unspecified: Secondary | ICD-10-CM | POA: Diagnosis not present

## 2023-03-27 NOTE — Addendum Note (Signed)
Addended by: Baldo Ash D on: 03/27/2023 10:51 AM   Modules accepted: Orders

## 2023-03-27 NOTE — Progress Notes (Signed)
Cardiology Consultation:    Date:  03/27/2023   ID:  Thomas Yoder, DOB 03/23/1961, MRN [id number]  PCP:  Esperanza Richters, PA-C  Cardiologist:  Gypsy Balsam, MD   Referring MD: Marisue Brooklyn   Chief Complaint  Patient presents with   Congestive Heart Failure       Here to be established with the patient  History of Present Illness:    Thomas Yoder is a 62 y.o. male who is being seen today for the evaluation of history of cardiomyopathy congestive heart failure at the request of [place], Ramon Dred[place], [place].  Past medical history significant for cardiomyopathy, initially discovered in 2019 with ejection fraction 40 to 45%.  He tells me that at that time he did not take him himself, he was a smoker he was eating what ever he wanted to eat his blood pressure was sky high.  He was put on guideline directed medical therapy and his ejection fraction normalized latest assessment in 2022 ejection fraction 55 to 60%.  He did have evaluation done for coronary artery disease in form of coronary CT angio that was done in 2019 shows some only mild disease.  Additional problem include diabetes, dyslipidemia, essential hypertension.  He was referred to [place] to be established as a patient.  Overall he is doing very well.  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  Sometimes he gets short of breath but no chest pain tightness squeezing pressure burning chest no dizziness no passing out  Past Medical History:  Diagnosis Date   CAD (coronary artery disease)    a. Cardiac CT showed calcium score of 83rd percentile, mild stenosis in the proximal LCx and the proximal and mid RCA, negative FFR for hemodynamic significance.   Chronic combined systolic and diastolic CHF (congestive heart failure) 12/11/2018   Depression    Former tobacco use    GERD (gastroesophageal reflux disease)    H/O noncompliance with medical treatment, presenting hazards to health    history of  NICM (nonischemic cardiomyopathy) ([facility])    Recovered cardiomyopathy (LVEF 45-50%, most recent 55 to 60%)   Hyperlipidemia    Hypertension    Obesity (BMI 30-39.9)    OSA (obstructive sleep apnea) 07/24/2019   uses cpap   Phimosis    Snoring    TYPE 2Diabetes mellitus without complication [facility])     Past Surgical History:  Procedure Laterality Date   CIRCUMCISION N/A 08/23/2022   Procedure: CIRCUMCISION ADULT;  Surgeon: Belva Agee, MD;  Location: [facility];  Service: Urology;  Laterality: N/A;  45 MINS   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   aspirin EC 81 MG tablet Take 1 tablet (81 mg total) by mouth daily.   atorvastatin (LIPITOR) 20 MG tablet Take 1 tablet (20 mg total) by mouth daily.   carvedilol (COREG) 25 MG tablet Take 1 tablet (25 mg total) by mouth 2 (two) times daily.   empagliflozin (JARDIANCE) 10 MG TABS tablet Take 1 tablet (10 mg total) by mouth daily.   fenofibrate (TRICOR) 145 MG tablet Take 1 tablet (145 mg total) by mouth daily.   metFORMIN (GLUCOPHAGE) 500  MG tablet Take 1 tablet (500 mg total) by mouth 2 (two) times daily.   sacubitril-valsartan (ENTRESTO) 97-103 MG Take 1 tablet by mouth 2 (two) times daily.   spironolactone (ALDACTONE) 25 MG tablet Take 1 tablet (25 mg total) by mouth every morning.     Allergies:   Patient has no known allergies.   Social History   Socioeconomic History   Marital status: Married    Spouse name: Not on file   Number of children: 3   Years of education: Not on file   Highest education level: Not on file  Occupational History   Not on file  Tobacco Use   Smoking status: Former    Types: Cigarettes    Quit date: 2019     Years since quitting: 5.2   Smokeless tobacco: Never   Tobacco comments:    2-3 weeks  Vaping Use   Vaping Use: Never used  Substance and Sexual Activity   Alcohol use: No   Drug use: Not Currently    Types: Marijuana    Comment: 08-15-22   Sexual activity: Yes    Birth control/protection: Condom  Other Topics Concern   Not on file  Social History Narrative   Not on file   Social Determinants of Health   Financial Resource Strain: Not on file  Food Insecurity: Not on file  Transportation Needs: Not on file  Physical Activity: Not on file  Stress: Not on file  Social Connections: Not on file     Family History: The patient's I did family history includes Diabetes in his mother; Hypertension in his father and mother. There is no history of CAD, Stroke, or Cancer. ROS:   Please see the history of present illness.    All 14 point review of systems negative except as described per history of present illness.  EKGs/Labs/Other Studies Reviewed:    The following studies were reviewed today: Review coronary CT angio from 2019.  EKG:  EKG is  ordered today.  The ekg ordered today demonstrates normal sinus rhythm, normal P interval normal QS complex duration morphology no ST segment changes.  Recent Labs: 06/23/2022: Magnesium 2.3 02/16/2023: ALT 15; BUN 19; Creatinine, Ser 0.93; Hemoglobin 14.7; Platelets 244.0; Potassium 4.4; Sodium 141  Recent Lipid Panel    Component Value Date/Time   CHOL 123 02/16/2023 1033   CHOL 163 12/27/2021 1027   TRIG 161.0 (H) 02/16/2023 1033   HDL 36.50 (L) 02/16/2023 1033   HDL 39 (L) 12/27/2021 1027   CHOLHDL 3 02/16/2023 1033   VLDL 32.2 02/16/2023 1033   LDLCALC 54 02/16/2023 1033   LDLCALC 95 12/27/2021 1027   LDLDIRECT 96 12/27/2021 1027    Physical Exam:    VS:  BP 110/80 (BP Location: Left Arm, Patient Position: Sitting)   Pulse 61   Ht 5\' 7"  (1.702 m)   Wt 234 lb (106.1 kg)   SpO2 95%   BMI 36.65 kg/m     Wt Readings from  Last 3 Encounters:  03/27/23 234 lb (106.1 kg)  02/16/23 232 lb (105.2 kg)  01/06/23 235 lb 12.8 oz (107 kg)     GEN:  Well nourished, well developed in no acute distress HEENT: Normal NECK: No JVD; No carotid bruits LYMPHATICS: No lymphadenopathy CARDIAC: RRR, no murmurs, no rubs, no gallops RESPIRATORY:  Clear to auscultation without rales, wheezing or rhonchi  ABDOMEN: Soft, non-tender, non-distended MUSCULOSKELETAL:  No edema; No deformity  SKIN: Warm and dry NEUROLOGIC:  Alert and  oriented x 3 PSYCHIATRIC:  Normal affect   ASSESSMENT:    1. Chronic combined systolic and diastolic CHF (congestive heart failure)   2. Essential hypertension, benign   3. OSA (obstructive sleep apnea)   4. Type 2 diabetes mellitus with other specified complication, without long-term current use of insulin   5. Obesity (BMI 30-39.9)   6. Snoring    PLAN:    In order of problems listed above:  Chronic combined systolic and diastolic congestive heart failure.  Last echocardiogram from more than 2 years ago showed preserved left ventricle ejection fraction.  Will schedule me to have echocardiogram done to recheck left ventricle ejection fraction.  Otherwise he it look like he is on excellent guideline directed medical therapy which I will not alter it. Essential hypertension blood pressure well-controlled continue present management. Dyslipidemia I did review K PN which show me LDL of 54 HDL 36 this is from February 2024 we will continue that management which is moderate intensity statin Lipitor 20. Diabetes follow-up excellent like always by primary care physician.  His last hemoglobin A1c 6.5. Obesity concerning he understand he is trying to work and lose some weight. Snoring he does have sleep apnea diagnosis use CPAP mask with good results.  Will continue   Medication Adjustments/Labs and Tests Ordered: Current medicines are reviewed at length with the patient today.  Concerns regarding  medicines are outlined above.  No orders of the defined types were placed in this encounter.  No orders of the defined types were placed in this encounter.   Signed, Georgeanna Leaobert J. Sabin Gibeault, MD, [facility]. 03/27/2023 10:47 AM    [facility]

## 2023-03-27 NOTE — Patient Instructions (Signed)

## 2023-04-19 ENCOUNTER — Ambulatory Visit (HOSPITAL_BASED_OUTPATIENT_CLINIC_OR_DEPARTMENT_OTHER)
Admission: RE | Admit: 2023-04-19 | Discharge: 2023-04-19 | Disposition: A | Discharge status: Home / Self Care | Payer: Commercial Managed Care - PPO | Source: Ambulatory Visit | Attending: Cardiology | Admitting: Cardiology

## 2023-04-19 DIAGNOSIS — R0609 Other forms of dyspnea: Secondary | ICD-10-CM

## 2023-04-19 DIAGNOSIS — I5042 Chronic combined systolic (congestive) and diastolic (congestive) heart failure: Secondary | ICD-10-CM | POA: Diagnosis not present

## 2023-04-19 LAB — ECHOCARDIOGRAM COMPLETE
Area-P 1/2: 2.05 cm2
S' Lateral: 2.8 cm

## 2023-04-21 ENCOUNTER — Telehealth: Payer: Self-pay

## 2023-04-21 NOTE — Telephone Encounter (Signed)
Patient notified through my chart.

## 2023-04-21 NOTE — Telephone Encounter (Signed)
-----   Message from Georgeanna Lea, MD sent at 04/20/2023  8:41 AM EDT ----- Echocardiogram showed preserved left ventricle ejection fraction, overall looks good

## 2023-05-02 ENCOUNTER — Other Ambulatory Visit (HOSPITAL_COMMUNITY): Payer: Self-pay

## 2023-05-10 ENCOUNTER — Other Ambulatory Visit (HOSPITAL_COMMUNITY): Payer: Self-pay

## 2023-05-10 MED ORDER — METFORMIN HCL 500 MG PO TABS
500.0000 mg | ORAL_TABLET | Freq: Two times a day (BID) | ORAL | 0 refills | Status: DC
  Filled 2023-05-10: qty 60, 30d supply, fill #0

## 2023-05-29 ENCOUNTER — Other Ambulatory Visit (HOSPITAL_COMMUNITY): Payer: Self-pay

## 2023-06-05 ENCOUNTER — Other Ambulatory Visit (HOSPITAL_COMMUNITY): Payer: Self-pay

## 2023-06-06 ENCOUNTER — Other Ambulatory Visit: Payer: Self-pay

## 2023-06-07 ENCOUNTER — Other Ambulatory Visit (HOSPITAL_COMMUNITY): Payer: Self-pay

## 2023-06-12 ENCOUNTER — Other Ambulatory Visit (HOSPITAL_COMMUNITY): Payer: Self-pay

## 2023-06-12 ENCOUNTER — Other Ambulatory Visit: Payer: Self-pay | Admitting: Cardiology

## 2023-06-12 DIAGNOSIS — I429 Cardiomyopathy, unspecified: Secondary | ICD-10-CM

## 2023-06-12 DIAGNOSIS — G4733 Obstructive sleep apnea (adult) (pediatric): Secondary | ICD-10-CM

## 2023-06-12 DIAGNOSIS — I10 Essential (primary) hypertension: Secondary | ICD-10-CM

## 2023-06-13 ENCOUNTER — Other Ambulatory Visit: Payer: Self-pay

## 2023-06-13 ENCOUNTER — Other Ambulatory Visit (HOSPITAL_COMMUNITY): Payer: Self-pay

## 2023-06-13 MED ORDER — SPIRONOLACTONE 25 MG PO TABS
25.0000 mg | ORAL_TABLET | Freq: Every morning | ORAL | 0 refills | Status: DC
  Filled 2023-06-13: qty 90, 90d supply, fill #0

## 2023-06-13 MED ORDER — ENTRESTO 97-103 MG PO TABS
1.0000 | ORAL_TABLET | Freq: Two times a day (BID) | ORAL | 0 refills | Status: DC
  Filled 2023-06-13: qty 180, 90d supply, fill #0

## 2023-06-13 MED ORDER — CARVEDILOL 25 MG PO TABS
25.0000 mg | ORAL_TABLET | Freq: Two times a day (BID) | ORAL | 0 refills | Status: DC
  Filled 2023-06-13: qty 180, 90d supply, fill #0

## 2023-06-14 ENCOUNTER — Other Ambulatory Visit: Payer: Self-pay

## 2023-07-05 ENCOUNTER — Other Ambulatory Visit (HOSPITAL_COMMUNITY): Payer: Self-pay

## 2023-07-07 ENCOUNTER — Ambulatory Visit: Payer: Commercial Managed Care - PPO | Admitting: Cardiology

## 2023-07-11 ENCOUNTER — Other Ambulatory Visit (HOSPITAL_COMMUNITY): Payer: Self-pay

## 2023-07-26 ENCOUNTER — Ambulatory Visit (HOSPITAL_BASED_OUTPATIENT_CLINIC_OR_DEPARTMENT_OTHER)
Admission: RE | Admit: 2023-07-26 | Discharge: 2023-07-26 | Disposition: A | Discharge status: Home / Self Care | Payer: Commercial Managed Care - PPO | Source: Ambulatory Visit | Attending: Medical | Admitting: Medical

## 2023-07-26 ENCOUNTER — Ambulatory Visit (INDEPENDENT_AMBULATORY_CARE_PROVIDER_SITE_OTHER): Payer: Commercial Managed Care - PPO | Admitting: Medical

## 2023-07-26 ENCOUNTER — Other Ambulatory Visit (HOSPITAL_COMMUNITY): Payer: Self-pay

## 2023-07-26 ENCOUNTER — Encounter: Payer: Self-pay | Admitting: Medical

## 2023-07-26 VITALS — BP 133/80 | HR 58 | Resp 18 | Ht 67.0 in | Wt 238.0 lb

## 2023-07-26 DIAGNOSIS — M1711 Unilateral primary osteoarthritis, right knee: Secondary | ICD-10-CM | POA: Diagnosis not present

## 2023-07-26 DIAGNOSIS — M25561 Pain in right knee: Secondary | ICD-10-CM | POA: Diagnosis not present

## 2023-07-26 DIAGNOSIS — G8929 Other chronic pain: Secondary | ICD-10-CM | POA: Diagnosis present

## 2023-07-26 DIAGNOSIS — Z7984 Long term (current) use of oral hypoglycemic drugs: Secondary | ICD-10-CM | POA: Diagnosis not present

## 2023-07-26 DIAGNOSIS — M85861 Other specified disorders of bone density and structure, right lower leg: Secondary | ICD-10-CM | POA: Diagnosis not present

## 2023-07-26 DIAGNOSIS — I5042 Chronic combined systolic (congestive) and diastolic (congestive) heart failure: Secondary | ICD-10-CM

## 2023-07-26 DIAGNOSIS — E119 Type 2 diabetes mellitus without complications: Secondary | ICD-10-CM | POA: Diagnosis not present

## 2023-07-26 DIAGNOSIS — R350 Frequency of micturition: Secondary | ICD-10-CM | POA: Diagnosis not present

## 2023-07-26 DIAGNOSIS — I10 Essential (primary) hypertension: Secondary | ICD-10-CM

## 2023-07-26 DIAGNOSIS — E785 Hyperlipidemia, unspecified: Secondary | ICD-10-CM

## 2023-07-26 DIAGNOSIS — R972 Elevated prostate specific antigen [PSA]: Secondary | ICD-10-CM | POA: Diagnosis not present

## 2023-07-26 DIAGNOSIS — M25461 Effusion, right knee: Secondary | ICD-10-CM | POA: Diagnosis not present

## 2023-07-26 LAB — COMPREHENSIVE METABOLIC PANEL
ALT: 22 U/L (ref 0–53)
AST: 18 U/L (ref 0–37)
Albumin: 4.2 g/dL (ref 3.5–5.2)
Alkaline Phosphatase: 62 U/L (ref 39–117)
BUN: 16 mg/dL (ref 6–23)
CO2: 29 mEq/L (ref 19–32)
Calcium: 9.8 mg/dL (ref 8.4–10.5)
Chloride: 107 mEq/L (ref 96–112)
Creatinine, Ser: 0.94 mg/dL (ref 0.40–1.50)
GFR: 87.68 mL/min (ref >60.00)
Glucose, Bld: 104 mg/dL — ABNORMAL HIGH (ref 70–99)
Potassium: 4.6 mEq/L (ref 3.5–5.1)
Sodium: 143 mEq/L (ref 135–145)
Total Bilirubin: 0.4 mg/dL (ref 0.2–1.2)
Total Protein: 6.6 g/dL (ref 6.0–8.3)

## 2023-07-26 LAB — POC URINALSYSI DIPSTICK (AUTOMATED)
Bilirubin, UA: NEGATIVE
Blood, UA: NEGATIVE
Clarity, UA: NEGATIVE
Glucose, UA: NEGATIVE
Ketones, UA: NEGATIVE
Leukocytes, UA: NEGATIVE
Nitrite, UA: NEGATIVE
Protein, UA: NEGATIVE
Spec Grav, UA: >=1.03 — AB (ref 1.010–1.025)
Urobilinogen, UA: 0.2 E.U./dL
pH, UA: 6 (ref 5.0–8.0)

## 2023-07-26 LAB — PSA: PSA: 2.17 ng/mL (ref 0.10–4.00)

## 2023-07-26 LAB — HEMOGLOBIN A1C: Hgb A1c MFr Bld: 6.7 % — ABNORMAL HIGH (ref 4.6–6.5)

## 2023-07-26 MED ORDER — EMPAGLIFLOZIN 10 MG PO TABS
10.0000 mg | ORAL_TABLET | Freq: Every day | ORAL | 3 refills | Status: DC
  Filled 2023-07-26: qty 90, 90d supply, fill #0
  Filled 2023-10-22: qty 90, 90d supply, fill #1
  Filled 2024-03-03: qty 90, 90d supply, fill #2
  Filled 2024-06-02: qty 90, 90d supply, fill #3

## 2023-07-26 MED ORDER — ATORVASTATIN CALCIUM 20 MG PO TABS
20.0000 mg | ORAL_TABLET | Freq: Every day | ORAL | 3 refills | Status: DC
  Filled 2023-07-26: qty 90, 90d supply, fill #0
  Filled 2023-10-22: qty 90, 90d supply, fill #1
  Filled 2024-03-21: qty 90, 90d supply, fill #2
  Filled 2024-06-30: qty 90, 90d supply, fill #3

## 2023-07-26 MED ORDER — METFORMIN HCL 500 MG PO TABS
500.0000 mg | ORAL_TABLET | Freq: Two times a day (BID) | ORAL | 3 refills | Status: DC
  Filled 2023-07-26: qty 180, 90d supply, fill #0
  Filled 2023-10-22: qty 180, 90d supply, fill #1
  Filled 2024-05-05: qty 180, 90d supply, fill #2

## 2023-07-26 NOTE — Patient Instructions (Addendum)
1. Chronic pain of right knee After xray review determine med if needed. Also determine if refer to specialist. - DG Knee Complete 4 Views Right; Future  2. Increased prostate specific antigen (PSA) velocity Follow value to see if velocity is increasing.  - PSA  3. Essential hypertension, benign -carvedilol and spirinolaction. Well controlled on bp recheck.  4. Diabetes mellitus without complication ([facility]) - continue jardiance and metformin. Placed refills today. - Hemoglobin A1c - Comp Met (CMET)  5. Hyperlipidemia, unspecified hyperlipidemia type -refilled atorvastatin. - Comp Met (CMET)  6. Chronic combined systolic and diastolic CHF (congestive heart failure) ([facility]) - continue entrestro and follow with cardilogist as planned.  7. Frequent urination Follow results and determine if need antibiotic. May have bph type symptoms. - POCT Urinalysis Dipstick (Automated) - Urine Culture; Future   Follow up in 3 months or sooner if needed.

## 2023-07-26 NOTE — Progress Notes (Unsigned)
Subjective:    Patient ID: Thomas Yoder, male    DOB: 18-Jul-1961, 62 y.o.   MRN: [id number]  HPI  Pt in for follow up.  On review apart from work no exercise. But states this Monday tried to restart walking but rt knee pain. More than year with rt knee pain.  Pt is diabetic- 6 months ago A1c was 6.5. Metformin 500 mg twice a day, jardiance 10 mg daily and metformin 500 mg bid. Get A1c today. Pt states he has been out of metformin for about 2 months.  Htn- pt in carvedilol and spirinolaction.   Hx of chf- Pt cardiologsit did rx pt entrestro on 06-13-2023.  High cholesterol- atovastain 20 mg daily.     Review of Systems  Constitutional:  Negative for chills, fatigue and fever.  Respiratory:  Negative for cough, chest tightness, shortness of breath and wheezing.   Cardiovascular:  Negative for chest pain and palpitations.  Gastrointestinal:  Negative for abdominal pain.  Genitourinary:  Negative for flank pain and frequency.       Does urinate 3 times a night. Some increased prostate velocity over last 5 years but only 2 values.  Musculoskeletal:        Rt knee pain  Skin:  Negative for rash.  Neurological:  Negative for dizziness, speech difficulty, weakness and light-headedness.  Psychiatric/Behavioral:  Negative for behavioral problems, decreased concentration and hallucinations. The patient is not nervous/anxious.     Past Medical History:  Diagnosis Date   CAD (coronary artery disease)    a. Cardiac CT showed calcium score of 83rd percentile, mild stenosis in the proximal LCx and the proximal and mid RCA, negative FFR for hemodynamic significance.   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%)   Hyperlipidemia    Hypertension    Obesity (BMI 30-39.9)    OSA (obstructive sleep apnea) 07/24/2019   uses cpap   Phimosis    Snoring    TYPE 2Diabetes mellitus without complication (HCC)      Social History   Socioeconomic History   Marital status: Married    Spouse name: Not on file   Number of children: 3   Years of education: Not on file   Highest education level: Not on file  Occupational History   Not on file  Tobacco Use   Smoking status: Former    Current packs/day: 0.00    Types: Cigarettes    Quit date: 2019    Years since quitting: 5.6   Smokeless tobacco: Never   Tobacco comments:    2-3 weeks  Vaping Use   Vaping status: Never Used  Substance and Sexual Activity   Alcohol use: No   Drug use: Not Currently    Types: Marijuana    Comment: 08-15-22   Sexual activity: Yes    Birth control/protection: Condom  Other Topics Concern   Not on file  Social History Narrative   Not on file   Social Determinants of Health   Financial Resource Strain: Not on file  Food Insecurity: Not on file  Transportation Needs: Not on file  Physical Activity: Not on file  Stress: Not on file  Social Connections:  Not on file  Intimate Partner Violence: Not on file    Past Surgical History:  Procedure Laterality Date   CIRCUMCISION N/A 08/23/2022   Procedure: CIRCUMCISION ADULT;  Surgeon: Belva Agee, MD;  Location: [facility];  Service: Urology;  Laterality: N/A;  45 MINS   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     aspirin EC 81 MG tablet Take 1 tablet (81 mg total) by mouth daily. 30 tablet    carvedilol (COREG) 25 MG tablet Take 1 tablet (25 mg total) by mouth 2 (two) times daily. 180 tablet 0   fenofibrate (TRICOR) 145 MG tablet Take 1 tablet (145 mg total) by mouth daily. 90 tablet 1   sacubitril-valsartan (ENTRESTO) 97-103 MG Take 1 tablet by mouth 2 (two) times daily. 180 tablet 0   spironolactone (ALDACTONE) 25 MG tablet Take 1 tablet (25 mg total) by mouth every morning. 90 tablet 0   No current facility-administered medications on file prior to visit.    BP 133/80   Pulse (!) 58   Resp 18   Ht 5\' 7"  (1.702 m)   Wt 238 lb (108 kg)   SpO2 100%   BMI 37.28 kg/m        Objective:   Physical Exam  General Mental Status- Alert. General Appearance- Not in acute distress.   Skin General: Color- Normal Color. Moisture- Normal Moisture.  Neck Carotid Arteries- Normal color. Moisture- Normal Moisture. No carotid bruits. No JVD.  Chest and Lung Exam Auscultation: Breath Sounds:-Normal.  Cardiovascular Auscultation:Rythm- Regular. Murmurs & Other Heart Sounds:Auscultation of the heart reveals- No Murmurs.  Abdomen Inspection:-Inspeection Normal. Palpation/Percussion:Note:No mass. Palpation and Percussion of the abdomen reveal- Non Tender, Non Distended + BS, no rebound or guarding.    Neurologic Cranial Nerve exam:- CN III-XII intact(No nystagmus), symmetric smile. Strength:- 5/5 equal and symmetric strength both upper and lower extremities.   Rt knee- mild pain on range of motion. Medial tibial platea mild tender to palpation. No crepitus. Negative homans signs. Calf not swollen.      Assessment & Plan:   Patient Instructions  1. Chronic pain of right knee After xray review determine med if needed. Also determine if refer to specialist. - DG Knee Complete 4  Views Right; Future  2. Increased prostate specific antigen (PSA) velocity Follow value to see if velocity is increasing.  - PSA  3. Essential hypertension, benign -carvedilol and spirinolaction. Well controlled on bp recheck.  4. Diabetes mellitus without complication (HCC) - continue jardiance and metformin. Placed refills today. - Hemoglobin A1c - Comp Met (CMET)  5. Hyperlipidemia, unspecified hyperlipidemia type -refilled atorvastatin. - Comp Met (CMET)  6. Chronic combined systolic and diastolic CHF (congestive heart failure) (HCC) - continue entrestro and follow with cardilogist as planned.  7. Frequent urination Follow results and determine if need antibiotic. May have bph type symptoms. - POCT Urinalysis  Dipstick (Automated) - Urine Culture; Future   Follow up in 3 months or sooner if needed.

## 2023-08-23 ENCOUNTER — Other Ambulatory Visit (HOSPITAL_COMMUNITY): Payer: Self-pay

## 2023-10-22 ENCOUNTER — Other Ambulatory Visit (HOSPITAL_COMMUNITY): Payer: Self-pay

## 2023-10-22 ENCOUNTER — Other Ambulatory Visit: Payer: Self-pay | Admitting: Cardiology

## 2023-10-22 DIAGNOSIS — I429 Cardiomyopathy, unspecified: Secondary | ICD-10-CM

## 2023-10-22 DIAGNOSIS — I10 Essential (primary) hypertension: Secondary | ICD-10-CM

## 2023-10-23 ENCOUNTER — Other Ambulatory Visit (HOSPITAL_COMMUNITY): Payer: Self-pay

## 2023-10-23 ENCOUNTER — Other Ambulatory Visit: Payer: Self-pay

## 2023-10-23 MED ORDER — CARVEDILOL 25 MG PO TABS
25.0000 mg | ORAL_TABLET | Freq: Two times a day (BID) | ORAL | 0 refills | Status: DC
Start: 2023-10-23 — End: 2024-01-19
  Filled 2023-10-23: qty 180, 90d supply, fill #0

## 2023-10-23 MED ORDER — SPIRONOLACTONE 25 MG PO TABS
25.0000 mg | ORAL_TABLET | Freq: Every morning | ORAL | 0 refills | Status: DC
  Filled 2023-10-23: qty 90, 90d supply, fill #0

## 2023-11-06 ENCOUNTER — Other Ambulatory Visit (HOSPITAL_COMMUNITY): Payer: Self-pay

## 2023-11-20 ENCOUNTER — Other Ambulatory Visit (HOSPITAL_COMMUNITY): Payer: Self-pay

## 2023-12-05 ENCOUNTER — Other Ambulatory Visit (HOSPITAL_COMMUNITY): Payer: Self-pay

## 2024-01-19 ENCOUNTER — Other Ambulatory Visit (HOSPITAL_COMMUNITY): Payer: Self-pay

## 2024-01-19 ENCOUNTER — Other Ambulatory Visit: Payer: Self-pay | Admitting: Cardiology

## 2024-01-19 DIAGNOSIS — I429 Cardiomyopathy, unspecified: Secondary | ICD-10-CM

## 2024-01-19 DIAGNOSIS — I10 Essential (primary) hypertension: Secondary | ICD-10-CM

## 2024-01-22 ENCOUNTER — Other Ambulatory Visit (HOSPITAL_COMMUNITY): Payer: Self-pay

## 2024-01-22 MED ORDER — SPIRONOLACTONE 25 MG PO TABS
25.0000 mg | ORAL_TABLET | Freq: Every morning | ORAL | 0 refills | Status: DC
  Filled 2024-01-22: qty 30, 30d supply, fill #0

## 2024-01-22 MED ORDER — CARVEDILOL 25 MG PO TABS
25.0000 mg | ORAL_TABLET | Freq: Two times a day (BID) | ORAL | 0 refills | Status: DC
  Filled 2024-01-22: qty 60, 30d supply, fill #0

## 2024-01-26 ENCOUNTER — Other Ambulatory Visit (HOSPITAL_COMMUNITY): Payer: Self-pay

## 2024-02-05 ENCOUNTER — Other Ambulatory Visit: Payer: Self-pay | Admitting: Cardiology

## 2024-02-05 DIAGNOSIS — I429 Cardiomyopathy, unspecified: Secondary | ICD-10-CM

## 2024-02-05 DIAGNOSIS — G4733 Obstructive sleep apnea (adult) (pediatric): Secondary | ICD-10-CM

## 2024-02-06 ENCOUNTER — Other Ambulatory Visit (HOSPITAL_COMMUNITY): Payer: Self-pay

## 2024-02-06 ENCOUNTER — Other Ambulatory Visit: Payer: Self-pay

## 2024-02-06 MED ORDER — SACUBITRIL-VALSARTAN 97-103 MG PO TABS
1.0000 | ORAL_TABLET | Freq: Two times a day (BID) | ORAL | 0 refills | Status: AC
  Filled 2024-02-06: qty 180, 90d supply, fill #0

## 2024-03-04 ENCOUNTER — Other Ambulatory Visit (HOSPITAL_COMMUNITY): Payer: Self-pay

## 2024-03-07 ENCOUNTER — Other Ambulatory Visit (HOSPITAL_COMMUNITY): Payer: Self-pay

## 2024-03-21 ENCOUNTER — Other Ambulatory Visit: Payer: Self-pay | Admitting: Cardiology

## 2024-03-21 ENCOUNTER — Other Ambulatory Visit: Payer: Self-pay

## 2024-03-21 ENCOUNTER — Other Ambulatory Visit (HOSPITAL_COMMUNITY): Payer: Self-pay

## 2024-03-21 DIAGNOSIS — I10 Essential (primary) hypertension: Secondary | ICD-10-CM

## 2024-03-21 DIAGNOSIS — I429 Cardiomyopathy, unspecified: Secondary | ICD-10-CM

## 2024-03-21 MED ORDER — SPIRONOLACTONE 25 MG PO TABS
25.0000 mg | ORAL_TABLET | Freq: Every morning | ORAL | 0 refills | Status: DC
  Filled 2024-03-21: qty 15, 15d supply, fill #0

## 2024-03-25 ENCOUNTER — Other Ambulatory Visit (HOSPITAL_COMMUNITY): Payer: Self-pay

## 2024-03-27 ENCOUNTER — Other Ambulatory Visit (HOSPITAL_COMMUNITY): Payer: Self-pay

## 2024-03-28 ENCOUNTER — Other Ambulatory Visit (HOSPITAL_COMMUNITY): Payer: Self-pay

## 2024-03-28 MED ORDER — FLUTICASONE PROPIONATE 50 MCG/ACT NA SUSP
2.0000 | Freq: Every day | NASAL | 0 refills | Status: AC
Start: 2024-03-27 — End: ?
  Filled 2024-03-28: qty 16, 30d supply, fill #0

## 2024-03-28 MED ORDER — AMOXICILLIN-POT CLAVULANATE 875-125 MG PO TABS
1.0000 | ORAL_TABLET | Freq: Two times a day (BID) | ORAL | 0 refills | Status: AC
Start: 2024-03-27 — End: 2024-04-04
  Filled 2024-03-28: qty 14, 7d supply, fill #0

## 2024-04-03 ENCOUNTER — Other Ambulatory Visit (HOSPITAL_COMMUNITY): Payer: Self-pay

## 2024-05-05 ENCOUNTER — Other Ambulatory Visit: Payer: Self-pay | Admitting: Cardiology

## 2024-05-05 DIAGNOSIS — I10 Essential (primary) hypertension: Secondary | ICD-10-CM

## 2024-05-05 DIAGNOSIS — I429 Cardiomyopathy, unspecified: Secondary | ICD-10-CM

## 2024-05-06 ENCOUNTER — Other Ambulatory Visit: Payer: Self-pay

## 2024-05-06 ENCOUNTER — Other Ambulatory Visit (HOSPITAL_COMMUNITY): Payer: Self-pay

## 2024-05-07 ENCOUNTER — Other Ambulatory Visit (HOSPITAL_COMMUNITY): Payer: Self-pay

## 2024-05-07 MED ORDER — CARVEDILOL 25 MG PO TABS
25.0000 mg | ORAL_TABLET | Freq: Two times a day (BID) | ORAL | 0 refills | Status: DC
  Filled 2024-05-07: qty 60, 30d supply, fill #0

## 2024-05-07 MED ORDER — SPIRONOLACTONE 25 MG PO TABS
25.0000 mg | ORAL_TABLET | Freq: Every morning | ORAL | 0 refills | Status: DC
  Filled 2024-05-07: qty 15, 15d supply, fill #0

## 2024-05-19 ENCOUNTER — Other Ambulatory Visit: Payer: Self-pay | Admitting: Cardiology

## 2024-05-19 DIAGNOSIS — I429 Cardiomyopathy, unspecified: Secondary | ICD-10-CM

## 2024-05-19 DIAGNOSIS — I10 Essential (primary) hypertension: Secondary | ICD-10-CM

## 2024-05-30 ENCOUNTER — Other Ambulatory Visit: Payer: Self-pay | Admitting: Cardiology

## 2024-05-30 DIAGNOSIS — I429 Cardiomyopathy, unspecified: Secondary | ICD-10-CM

## 2024-05-30 DIAGNOSIS — I10 Essential (primary) hypertension: Secondary | ICD-10-CM

## 2024-06-02 ENCOUNTER — Other Ambulatory Visit: Payer: Self-pay | Admitting: Cardiology

## 2024-06-02 DIAGNOSIS — I10 Essential (primary) hypertension: Secondary | ICD-10-CM

## 2024-06-02 DIAGNOSIS — I429 Cardiomyopathy, unspecified: Secondary | ICD-10-CM

## 2024-06-03 ENCOUNTER — Other Ambulatory Visit: Payer: Self-pay

## 2024-06-06 ENCOUNTER — Other Ambulatory Visit (HOSPITAL_COMMUNITY): Payer: Self-pay

## 2024-06-30 ENCOUNTER — Other Ambulatory Visit: Payer: Self-pay | Admitting: Cardiology

## 2024-06-30 DIAGNOSIS — I10 Essential (primary) hypertension: Secondary | ICD-10-CM

## 2024-06-30 DIAGNOSIS — I429 Cardiomyopathy, unspecified: Secondary | ICD-10-CM

## 2024-07-01 ENCOUNTER — Other Ambulatory Visit: Payer: Self-pay

## 2024-07-01 NOTE — Telephone Encounter (Signed)
 Pt is overdue for an appt with Dr. Krasowski. Pt has had 3 attempts to make an overdue appt and pt has not done so. Would Dr. Krasowski like to refill this medication without pt being seen? Please address

## 2024-07-02 ENCOUNTER — Other Ambulatory Visit (HOSPITAL_COMMUNITY): Payer: Self-pay

## 2024-07-05 ENCOUNTER — Other Ambulatory Visit (HOSPITAL_COMMUNITY): Payer: Self-pay

## 2024-07-06 ENCOUNTER — Other Ambulatory Visit (HOSPITAL_COMMUNITY): Payer: Self-pay

## 2024-07-17 ENCOUNTER — Other Ambulatory Visit: Payer: Self-pay | Admitting: Cardiology

## 2024-07-17 DIAGNOSIS — I429 Cardiomyopathy, unspecified: Secondary | ICD-10-CM

## 2024-07-17 DIAGNOSIS — G4733 Obstructive sleep apnea (adult) (pediatric): Secondary | ICD-10-CM

## 2024-07-17 DIAGNOSIS — I10 Essential (primary) hypertension: Secondary | ICD-10-CM

## 2024-07-19 ENCOUNTER — Encounter (HOSPITAL_COMMUNITY): Payer: Self-pay

## 2024-07-19 ENCOUNTER — Other Ambulatory Visit (HOSPITAL_COMMUNITY): Payer: Self-pay

## 2024-07-31 ENCOUNTER — Ambulatory Visit: Payer: Self-pay

## 2024-07-31 NOTE — Telephone Encounter (Signed)
  FYI Only or Action Required?: Action required by provider: request for appointment.  Patient was last seen in primary care on 07/26/2023 by Dorina Loving, PA-C.  Called Nurse Triage reporting Vomiting.  Symptoms began about a month ago.  Interventions attempted: OTC medications: TUMS.  Symptoms are: gradually worsening.Symptoms are mainly at night after he lays down.  Triage Disposition: See Physician Within 24 Hours  Patient/caregiver understands and will follow disposition?: Yes  Copied from CRM [id number]. Topic: Clinical - Red Word Triage >> Jul 31, 2024  8:51 AM Robinson H wrote: [facility] that prompted transfer to Nurse Triage: Possible indigestion, vomiting and pain in chest Reason for Disposition  [1] MILD or MODERATE vomiting AND [2] present > 48 hours (2 days)  (Exception: Mild vomiting with associated diarrhea.)  Answer Assessment - Initial Assessment Questions 1. VOMITING SEVERITY: How many times have you vomited in the past 24 hours?      1 2. ONSET: When did the vomiting begin?      1 month ago 3. FLUIDS: What fluids or food have you vomited up today? Have you been able to keep any fluids down?     yes 4. ABDOMEN PAIN: Are your having any abdomen pain? If Yes : How bad is it and what does it feel like? (e.g., crampy, dull, intermittent, constant)      no 5. DIARRHEA: Is there any diarrhea? If Yes, ask: How many times today?      Comes and goes 6. CONTACTS: Is there anyone else in the family with the same symptoms?      no 7. CAUSE: What do you think is causing your vomiting?     Maybe indigestion  8. HYDRATION STATUS: Any signs of dehydration? (e.g., dry mouth [not only dry lips], too weak to stand) When did you last urinate?     no 9. OTHER SYMPTOMS: Do you have any other symptoms? (e.g., fever, headache, vertigo, vomiting blood or coffee grounds, recent head injury)     no 10. PREGNANCY: Is there any chance you are pregnant? When  was your last menstrual period?       N/a  Protocols used: Vomiting-A-AH

## 2024-08-01 ENCOUNTER — Ambulatory Visit: Payer: Self-pay | Admitting: Physician Assistant

## 2024-08-01 ENCOUNTER — Other Ambulatory Visit (HOSPITAL_COMMUNITY): Payer: Self-pay

## 2024-08-01 ENCOUNTER — Encounter: Payer: Self-pay | Admitting: Physician Assistant

## 2024-08-01 ENCOUNTER — Encounter (HOSPITAL_COMMUNITY): Payer: Self-pay

## 2024-08-01 VITALS — BP 113/77 | HR 81 | Ht 67.0 in | Wt 252.8 lb

## 2024-08-01 DIAGNOSIS — R351 Nocturia: Secondary | ICD-10-CM | POA: Diagnosis not present

## 2024-08-01 DIAGNOSIS — I5042 Chronic combined systolic (congestive) and diastolic (congestive) heart failure: Secondary | ICD-10-CM | POA: Diagnosis not present

## 2024-08-01 DIAGNOSIS — K219 Gastro-esophageal reflux disease without esophagitis: Secondary | ICD-10-CM

## 2024-08-01 DIAGNOSIS — R1111 Vomiting without nausea: Secondary | ICD-10-CM

## 2024-08-01 LAB — CBC WITH DIFFERENTIAL/PLATELET
Basophils Absolute: 0 K/uL (ref 0.0–0.1)
Basophils Relative: 0.6 % (ref 0.0–3.0)
Eosinophils Absolute: 0.2 K/uL (ref 0.0–0.7)
Eosinophils Relative: 4.2 % (ref 0.0–5.0)
HCT: 45.7 % (ref 39.0–52.0)
Hemoglobin: 14.3 g/dL (ref 13.0–17.0)
Lymphocytes Relative: 44.5 % (ref 12.0–46.0)
Lymphs Abs: 2.3 K/uL (ref 0.7–4.0)
MCHC: 31.3 g/dL (ref 30.0–36.0)
MCV: 79.8 fl (ref 78.0–100.0)
Monocytes Absolute: 0.3 K/uL (ref 0.1–1.0)
Monocytes Relative: 6.4 % (ref 3.0–12.0)
Neutro Abs: 2.3 K/uL (ref 1.4–7.7)
Neutrophils Relative %: 44.3 % (ref 43.0–77.0)
Platelets: 184 K/uL (ref 150.0–400.0)
RBC: 5.73 Mil/uL (ref 4.22–5.81)
RDW: 16.8 % — ABNORMAL HIGH (ref 11.5–15.5)
WBC: 5.1 K/uL (ref 4.0–10.5)

## 2024-08-01 LAB — COMPREHENSIVE METABOLIC PANEL WITH GFR
ALT: 13 U/L (ref 0–53)
AST: 12 U/L (ref 0–37)
Albumin: 4 g/dL (ref 3.5–5.2)
Alkaline Phosphatase: 102 U/L (ref 39–117)
BUN: 14 mg/dL (ref 6–23)
CO2: 30 meq/L (ref 19–32)
Calcium: 9.2 mg/dL (ref 8.4–10.5)
Chloride: 107 meq/L (ref 96–112)
Creatinine, Ser: 0.96 mg/dL (ref 0.40–1.50)
GFR: 84.88 mL/min (ref >60.00)
Glucose, Bld: 109 mg/dL — ABNORMAL HIGH (ref 70–99)
Potassium: 4.7 meq/L (ref 3.5–5.1)
Sodium: 144 meq/L (ref 135–145)
Total Bilirubin: 0.4 mg/dL (ref 0.2–1.2)
Total Protein: 6.3 g/dL (ref 6.0–8.3)

## 2024-08-01 LAB — LIPASE: Lipase: 50 U/L (ref 11.0–59.0)

## 2024-08-01 LAB — BRAIN NATRIURETIC PEPTIDE: Pro B Natriuretic peptide (BNP): 41 pg/mL (ref 0.0–100.0)

## 2024-08-01 LAB — PSA: PSA: 2.24 ng/mL (ref 0.10–4.00)

## 2024-08-01 MED ORDER — SUCRALFATE 1 G PO TABS
1.0000 g | ORAL_TABLET | Freq: Every evening | ORAL | 0 refills | Status: AC | PRN
  Filled 2024-08-01: qty 30, 30d supply, fill #0

## 2024-08-01 MED ORDER — PANTOPRAZOLE SODIUM 40 MG PO TBEC
40.0000 mg | DELAYED_RELEASE_TABLET | Freq: Every day | ORAL | 3 refills | Status: AC
  Filled 2024-08-01: qty 30, 30d supply, fill #0
  Filled 2024-09-08: qty 30, 30d supply, fill #1
  Filled 2024-10-27: qty 30, 30d supply, fill #2
  Filled 2024-12-30: qty 30, 30d supply, fill #3

## 2024-08-01 NOTE — Assessment & Plan Note (Signed)
 Euvolemic today.  Overdue for cardiology f/u and intermittently takes meds Will check bnp

## 2024-08-01 NOTE — Progress Notes (Signed)
 Established patient visit   Patient: Thomas Yoder   DOB: 09/24/1961   63 y.o. Male  MRN: [id number] Visit Date: 08/01/2024  Today's healthcare provider: Manuelita Flatness, PA-C   Cc. Gerd, heartburn, vomiting  Subjective     Discussed the use of AI scribe software for clinical note transcription with the patient, who gave verbal consent to proceed.  History of Present Illness   Thomas Yoder is a 63 year old male who presents with reflux and vomiting.  He has reflux for the past couple of weeks, progressively worsening to include vomiting. He feels a sensation of something getting stuck in his chest, especially when lying down, and a tightening sensation in his chest. Tums provide no significant relief. Symptoms worsen after eating, particularly if he lies down shortly after meals. He sometimes skips meals and eats later, which exacerbates the symptoms. He denies trouble swallowing. Denies abdominal pain.  He has used Prilosec in the past, but its effectiveness is unclear. He is not currently on any specific medication for these symptoms.  He experiences frequent urination at night, disrupting his sleep. He occasionally forgets to take his medications due to being busy or tired.       Medications: Outpatient Medications Prior to Visit  Medication Sig   acetaminophen  (TYLENOL ) 500 MG tablet Take 1,000 mg by mouth every 6 (six) hours as needed for moderate pain.   aspirin  EC 81 MG tablet Take 1 tablet (81 mg total) by mouth daily.   atorvastatin  (LIPITOR) 20 MG tablet Take 1 tablet (20 mg total) by mouth daily.   carvedilol  (COREG ) 25 MG tablet Take 1 tablet (25 mg total) by mouth 2 (two) times daily.   empagliflozin  (JARDIANCE ) 10 MG TABS tablet Take 1 tablet (10 mg total) by mouth daily.   fenofibrate  (TRICOR ) 145 MG tablet Take 1 tablet (145 mg total) by mouth daily.   fluticasone  (FLONASE ) 50 MCG/ACT nasal spray Place 2 sprays into both nostrils daily for 10 days.    metFORMIN  (GLUCOPHAGE ) 500 MG tablet Take 1 tablet (500 mg total) by mouth 2 (two) times daily.   spironolactone  (ALDACTONE ) 25 MG tablet Take 1 tablet (25 mg total) by mouth every morning.   No facility-administered medications prior to visit.    Review of Systems  Constitutional:  Negative for fatigue and fever.  Respiratory:  Negative for cough and shortness of breath.   Cardiovascular:  Negative for chest pain, palpitations and leg swelling.  Gastrointestinal:  Positive for vomiting.  Neurological:  Negative for dizziness and headaches.       Objective    BP 113/77   Pulse 81   Ht 5' 7 (1.702 m)   Wt 252 lb 12.8 oz (114.7 kg)   BMI 39.59 kg/m    Physical Exam Constitutional:      General: He is awake.     Appearance: He is well-developed.  HENT:     Head: Normocephalic.  Eyes:     Conjunctiva/sclera: Conjunctivae normal.  Cardiovascular:     Rate and Rhythm: Normal rate and regular rhythm.     Heart sounds: Normal heart sounds.  Pulmonary:     Effort: Pulmonary effort is normal.     Breath sounds: Normal breath sounds.  Skin:    General: Skin is warm.  Neurological:     Mental Status: He is alert and oriented to person, place, and time.  Psychiatric:        Attention and Perception: Attention normal.  Mood and Affect: Mood normal.        Speech: Speech normal.        Behavior: Behavior is cooperative.      No results found for any visits on 08/01/24.  Assessment & Plan    Vomiting without nausea, unspecified vomiting type Gastroesophageal reflux disease, unspecified whether esophagitis present Recommending pantoprazole  40 mg in AM on empty stomach, rx carafate  at bedtime.  Will check labs  If symptoms persist f/u with pcp vs GI referral.  -     CBC with Differential/Platelet -     Comprehensive metabolic panel with GFR -     Pantoprazole  Sodium; Take 1 tablet (40 mg total) by mouth daily.  Dispense: 30 tablet; Refill: 3 -     Sucralfate ; Take  1 tablet (1 g total) by mouth at bedtime as needed.  Dispense: 30 tablet; Refill: 0 -     Lipase  Nocturia -     PSA  Chronic combined systolic and diastolic CHF (congestive heart failure) (HCC) Assessment & Plan: Euvolemic today.  Overdue for cardiology f/u and intermittently takes meds Will check bnp  Orders: -     Brain natriuretic peptide    Return if symptoms worsen or fail to improve.       Manuelita Flatness, PA-C  [facility] at [facility] [phone] (phone) [phone] (fax)  [facility]

## 2024-08-02 ENCOUNTER — Ambulatory Visit: Payer: Self-pay | Admitting: Physician Assistant

## 2024-08-15 ENCOUNTER — Ambulatory Visit: Attending: Cardiology | Admitting: Cardiology

## 2024-08-15 ENCOUNTER — Encounter: Payer: Self-pay | Admitting: Cardiology

## 2024-08-15 ENCOUNTER — Other Ambulatory Visit (HOSPITAL_COMMUNITY): Payer: Self-pay

## 2024-08-15 VITALS — BP 112/88 | HR 76 | Wt 258.1 lb

## 2024-08-15 DIAGNOSIS — I5042 Chronic combined systolic (congestive) and diastolic (congestive) heart failure: Secondary | ICD-10-CM

## 2024-08-15 DIAGNOSIS — G4733 Obstructive sleep apnea (adult) (pediatric): Secondary | ICD-10-CM

## 2024-08-15 DIAGNOSIS — I429 Cardiomyopathy, unspecified: Secondary | ICD-10-CM | POA: Diagnosis not present

## 2024-08-15 DIAGNOSIS — I10 Essential (primary) hypertension: Secondary | ICD-10-CM

## 2024-08-15 DIAGNOSIS — E1169 Type 2 diabetes mellitus with other specified complication: Secondary | ICD-10-CM

## 2024-08-15 MED ORDER — SACUBITRIL-VALSARTAN 97-103 MG PO TABS
1.0000 | ORAL_TABLET | Freq: Two times a day (BID) | ORAL | 3 refills | Status: AC
  Filled 2024-08-15 – 2024-09-08 (×3): qty 180, 90d supply, fill #0
  Filled 2024-12-30: qty 180, 90d supply, fill #1

## 2024-08-15 MED ORDER — SPIRONOLACTONE 25 MG PO TABS
25.0000 mg | ORAL_TABLET | Freq: Every morning | ORAL | 3 refills | Status: AC
  Filled 2024-08-15: qty 90, 90d supply, fill #0
  Filled 2024-12-30: qty 90, 90d supply, fill #1

## 2024-08-15 NOTE — Patient Instructions (Signed)

## 2024-08-15 NOTE — Progress Notes (Signed)
 Cardiology Office Note:    Date:  08/15/2024   ID:  Thomas Yoder, DOB [birth year], MRN [id number]  PCP:  Dorina Loving, PA-C  Cardiologist:  Lamar Fitch, MD    Referring MD: Saguier, Edward, PA-C   No chief complaint on file.   History of Present Illness:    Thomas Yoder is a 63 y.o. male  who is being seen today for the evaluation of history of cardiomyopathy congestive heart failure at the request of Saguier, [place], [place].  Past medical history significant for cardiomyopathy, initially discovered in 2019 with ejection fraction 40 to 45%.  He tells me that at that time he did not take him himself, he was a smoker he was eating what ever he wanted to eat his blood pressure was sky high.  He was put on guideline directed medical therapy and his ejection fraction normalized latest assessment in 2022 ejection fraction 55 to 60%.  He did have evaluation done for coronary artery disease in form of coronary CT angio that was done in 2019 shows some only mild disease.  Additional problem include diabetes, dyslipidemia, essential hypertension.  Comes today 2 months for follow-up and we have quite interesting conversation today we discovered that we used to be a neighbor in [employer] he lived in [place] [address] and I live very close to his place.  He had a nice conversation about it.  He is doing well he complained of having right knee pain which prevent him from doing much of exercises.  But complain about the fact that he is obese and he is trying to work on losing weight which I strongly recommended.  Denies have any chest pain tightness squeezing pressure burning chest no palpitation dizziness swelling of lower extremities  Past Medical History:  Diagnosis Date   CAD (coronary artery disease)    a. Cardiac CT showed calcium  score of 83rd percentile, mild stenosis in the proximal LCx and the proximal and mid RCA, negative FFR for hemodynamic significance.   Chronic combined systolic and  diastolic CHF (congestive heart failure) (HCC) 12/11/2018   Depression    Former tobacco use    GERD (gastroesophageal reflux disease)    H/O noncompliance with medical treatment, presenting hazards to health    history of NICM (nonischemic cardiomyopathy) (HCC)    Recovered cardiomyopathy (LVEF 45-50%, most recent 55 to 60%)   Hyperlipidemia    Hypertension    Obesity (BMI 30-39.9)    OSA (obstructive sleep apnea) 07/24/2019   uses cpap   Phimosis    Snoring    TYPE 2Diabetes mellitus without complication [facility])     Past Surgical History:  Procedure Laterality Date   CIRCUMCISION N/A 08/23/2022   Procedure: CIRCUMCISION ADULT;  Surgeon: Rosalind Zachary NOVAK, MD;  Location: [facility];  Service: Urology;  Laterality: N/A;  45 MINS   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   aspirin  EC 81 MG tablet Take 1 tablet (81 mg total) by mouth daily.   atorvastatin  (LIPITOR) 20 MG tablet Take 1 tablet (20 mg total) by mouth daily.   carvedilol  (COREG ) 25 MG tablet Take 1 tablet (25 mg total) by mouth 2 (two) times daily.   empagliflozin  (  JARDIANCE ) 10 MG TABS tablet Take 1 tablet (10 mg total) by mouth daily.   fenofibrate  (TRICOR ) 145 MG tablet Take 1 tablet (145 mg total) by mouth daily.   fluticasone  (FLONASE ) 50 MCG/ACT nasal spray Place 2 sprays into both nostrils daily for 10 days.   metFORMIN  (GLUCOPHAGE ) 500 MG tablet Take 1 tablet (500 mg total) by mouth 2 (two) times daily.   pantoprazole  (PROTONIX ) 40 MG tablet Take 1 tablet (40 mg total) by mouth daily.   sacubitril -valsartan  (ENTRESTO ) 97-103 MG Take 1 tablet by mouth 2 (two) times daily.   spironolactone  (ALDACTONE ) 25 MG tablet Take 1 tablet  (25 mg total) by mouth every morning.   sucralfate  (CARAFATE ) 1 g tablet Take 1 tablet (1 g total) by mouth at bedtime as needed.     Allergies:   Patient has no known allergies.   Social History   Socioeconomic History   Marital status: Married    Spouse name: Not on file   Number of children: 3   Years of education: Not on file   Highest education level: Not on file  Occupational History   Not on file  Tobacco Use   Smoking status: Former    Current packs/day: 0.00    Types: Cigarettes    Quit date: 2019    Years since quitting: 6.6   Smokeless tobacco: Never   Tobacco comments:    2-3 weeks  Vaping Use   Vaping status: Never Used  Substance and Sexual Activity   Alcohol use: No   Drug use: Not Currently    Types: Marijuana    Comment: 08-15-22   Sexual activity: Yes    Birth control/protection: Condom  Other Topics Concern   Not on file  Social History Narrative   Not on file   Social Drivers of Health   Financial Resource Strain: Not on file  Food Insecurity: Not on file  Transportation Needs: Not on file  Physical Activity: Not on file  Stress: Not on file  Social Connections: Not on file     Family History: The patient's family history includes Diabetes in his mother; Hypertension in his father and mother. There is no history of CAD, Stroke, or Cancer. ROS:   Please see the history of present illness.    All 14 point review of systems negative except as described per history of present illness  EKGs/Labs/Other Studies Reviewed:    EKG Interpretation Date/Time:  Thursday August 15 2024 15:18:07 EDT Ventricular Rate:  76 PR Interval:  186 QRS Duration:  70 QT Interval:  350 QTC Calculation: 393 R Axis:   -21  Text Interpretation: Normal sinus rhythm Minimal voltage criteria for LVH, may be normal variant ( R in aVL ) When compared with ECG of 04-Feb-2020 14:04, Criteria for Anterior infarct are no longer Present Confirmed by Bernie Charleston  [phone]) on 08/15/2024 3:30:37 PM    Recent Labs: 08/01/2024: ALT 13; BUN 14; Creatinine, Ser 0.96; Hemoglobin 14.3; Platelets 184.0; Potassium 4.7; Pro B Natriuretic peptide (BNP) 41.0; Sodium 144  Recent Lipid Panel    Component Value Date/Time   CHOL 123 02/16/2023 1033   CHOL 163 12/27/2021 1027   TRIG 161.0 (H) 02/16/2023 1033   HDL 36.50 (L) 02/16/2023 1033   HDL 39 (L) 12/27/2021 1027   CHOLHDL 3 02/16/2023 1033   VLDL 32.2 02/16/2023 1033   LDLCALC 54 02/16/2023 1033   LDLCALC 95 12/27/2021 1027   LDLDIRECT 96 12/27/2021 1027    Physical Exam:  VS:  BP 112/88   Pulse 76   Wt 258 lb 1.9 oz (117.1 kg)   SpO2 95%   BMI 40.43 kg/m     Wt Readings from Last 3 Encounters:  08/15/24 258 lb 1.9 oz (117.1 kg)  08/01/24 252 lb 12.8 oz (114.7 kg)  07/26/23 238 lb (108 kg)     GEN:  Well nourished, well developed in no acute distress HEENT: Normal NECK: No JVD; No carotid bruits LYMPHATICS: No lymphadenopathy CARDIAC: RRR, no murmurs, no rubs, no gallops RESPIRATORY:  Clear to auscultation without rales, wheezing or rhonchi  ABDOMEN: Soft, non-tender, non-distended MUSCULOSKELETAL:  No edema; No deformity  SKIN: Warm and dry LOWER EXTREMITIES: no swelling NEUROLOGIC:  Alert and oriented x 3 PSYCHIATRIC:  Normal affect   ASSESSMENT:    1. Cardiomyopathy, unspecified type (HCC)   2. Recovered cardiomyopathy   3. Essential hypertension, benign   4. Chronic combined systolic and diastolic CHF (congestive heart failure) (HCC)   5. OSA (obstructive sleep apnea)   6. Type 2 diabetes mellitus with other specified complication, without long-term current use of insulin  (HCC)    PLAN:    In order of problems listed above:  History of cardiomyopathy last echocardiogram showed preserved ejection fraction we will continue guideline directed medical therapy. Dyslipidemia he is taking Lipitor 20 which I continue I did review K PN which show me LDL of 54 HDL 36 we will  continue present management. Obstructive sleep apnea that being followed by internal medicine team appropriately managed. Diabetes he is on Jardiance  hemoglobin A1c 6.7 this from last year we will have to recheck his fasting lipid profile and hemoglobin A1c.   Medication Adjustments/Labs and Tests Ordered: Current medicines are reviewed at length with the patient today.  Concerns regarding medicines are outlined above.  Orders Placed This Encounter  Procedures   EKG 12-Lead   Medication changes: No orders of the defined types were placed in this encounter.   Signed, Lamar DOROTHA Fitch, MD, [facility] 08/15/2024 3:43 PM    [facility]

## 2024-08-16 ENCOUNTER — Other Ambulatory Visit (HOSPITAL_COMMUNITY): Payer: Self-pay

## 2024-08-22 ENCOUNTER — Other Ambulatory Visit (HOSPITAL_COMMUNITY): Payer: Self-pay

## 2024-08-22 ENCOUNTER — Telehealth: Payer: Self-pay | Admitting: Pharmacy Technician

## 2024-08-22 NOTE — Telephone Encounter (Signed)
 Pharmacy Patient Advocate Encounter  Received notification from [facility] that Prior Authorization for entresto  has been APPROVED from 08/22/24 to 08/22/25. Ran test claim, Copay is $90.00. This test claim was processed through [facility]- copay amounts may vary at other pharmacies due to pharmacy/plan contracts, or as the patient moves through the different stages of their insurance plan.   PA #/Case ID/Reference #: [id number]    Being filled at our pharmacy

## 2024-08-22 NOTE — Telephone Encounter (Signed)
   Pharmacy Patient Advocate Encounter   Received notification from CoverMyMeds that prior authorization for entresto  is required/requested.   Insurance verification completed.   The patient is insured through [facility] .   Per test claim: PA required; PA submitted to above mentioned insurance via Latent Key/confirmation #/EOC [id number] Status is pending

## 2024-09-02 ENCOUNTER — Other Ambulatory Visit (HOSPITAL_COMMUNITY): Payer: Self-pay

## 2024-09-08 ENCOUNTER — Other Ambulatory Visit (HOSPITAL_COMMUNITY): Payer: Self-pay

## 2024-09-09 ENCOUNTER — Other Ambulatory Visit: Payer: Self-pay

## 2024-09-09 ENCOUNTER — Other Ambulatory Visit (HOSPITAL_COMMUNITY): Payer: Self-pay

## 2024-11-17 ENCOUNTER — Other Ambulatory Visit: Payer: Self-pay | Admitting: Cardiology

## 2024-11-17 DIAGNOSIS — I429 Cardiomyopathy, unspecified: Secondary | ICD-10-CM

## 2024-11-17 DIAGNOSIS — I10 Essential (primary) hypertension: Secondary | ICD-10-CM

## 2024-11-18 ENCOUNTER — Other Ambulatory Visit (HOSPITAL_COMMUNITY): Payer: Self-pay

## 2024-11-18 MED ORDER — CARVEDILOL 25 MG PO TABS
25.0000 mg | ORAL_TABLET | Freq: Two times a day (BID) | ORAL | 5 refills | Status: AC
  Filled 2024-11-18: qty 60, 30d supply, fill #0
  Filled 2024-12-30: qty 60, 30d supply, fill #1

## 2024-12-30 ENCOUNTER — Other Ambulatory Visit: Payer: Self-pay | Admitting: Medical

## 2024-12-31 ENCOUNTER — Other Ambulatory Visit (HOSPITAL_COMMUNITY): Payer: Self-pay

## 2024-12-31 ENCOUNTER — Other Ambulatory Visit: Payer: Self-pay

## 2024-12-31 MED ORDER — EMPAGLIFLOZIN 10 MG PO TABS
10.0000 mg | ORAL_TABLET | Freq: Every day | ORAL | 0 refills | Status: AC
  Filled 2024-12-31: qty 30, 30d supply, fill #0

## 2024-12-31 MED ORDER — METFORMIN HCL 500 MG PO TABS
500.0000 mg | ORAL_TABLET | Freq: Two times a day (BID) | ORAL | 0 refills | Status: AC
  Filled 2024-12-31: qty 60, 30d supply, fill #0

## 2024-12-31 MED ORDER — ATORVASTATIN CALCIUM 20 MG PO TABS
20.0000 mg | ORAL_TABLET | Freq: Every day | ORAL | 0 refills | Status: AC
  Filled 2024-12-31: qty 30, 30d supply, fill #0

## 2025-01-08 ENCOUNTER — Encounter: Payer: Self-pay | Admitting: Medical

## 2025-01-08 ENCOUNTER — Ambulatory Visit (INDEPENDENT_AMBULATORY_CARE_PROVIDER_SITE_OTHER): Admitting: Medical

## 2025-01-08 VITALS — BP 140/80 | HR 65 | Temp 97.9°F | Resp 16 | Ht 67.0 in | Wt 279.0 lb

## 2025-01-08 DIAGNOSIS — Z1211 Encounter for screening for malignant neoplasm of colon: Secondary | ICD-10-CM

## 2025-01-08 DIAGNOSIS — L603 Nail dystrophy: Secondary | ICD-10-CM | POA: Diagnosis not present

## 2025-01-08 DIAGNOSIS — F439 Reaction to severe stress, unspecified: Secondary | ICD-10-CM | POA: Diagnosis not present

## 2025-01-08 DIAGNOSIS — I10 Essential (primary) hypertension: Secondary | ICD-10-CM

## 2025-01-08 DIAGNOSIS — Z7985 Long-term (current) use of injectable non-insulin antidiabetic drugs: Secondary | ICD-10-CM

## 2025-01-08 DIAGNOSIS — E785 Hyperlipidemia, unspecified: Secondary | ICD-10-CM | POA: Diagnosis not present

## 2025-01-08 DIAGNOSIS — E119 Type 2 diabetes mellitus without complications: Secondary | ICD-10-CM

## 2025-01-08 DIAGNOSIS — Z7984 Long term (current) use of oral hypoglycemic drugs: Secondary | ICD-10-CM

## 2025-01-08 DIAGNOSIS — Z0000 Encounter for general adult medical examination without abnormal findings: Secondary | ICD-10-CM | POA: Diagnosis not present

## 2025-01-08 DIAGNOSIS — G4733 Obstructive sleep apnea (adult) (pediatric): Secondary | ICD-10-CM | POA: Diagnosis not present

## 2025-01-08 DIAGNOSIS — Z125 Encounter for screening for malignant neoplasm of prostate: Secondary | ICD-10-CM

## 2025-01-08 DIAGNOSIS — L918 Other hypertrophic disorders of the skin: Secondary | ICD-10-CM | POA: Diagnosis not present
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
## 2025-01-09 LAB — COMPREHENSIVE METABOLIC PANEL WITH GFR
ALT: 28 U/L (ref 3–53)
AST: 18 U/L (ref 5–37)
Albumin: 3.9 g/dL (ref 3.5–5.2)
Alkaline Phosphatase: 101 U/L (ref 39–117)
BUN: 12 mg/dL (ref 6–23)
CO2: 31 meq/L (ref 19–32)
Calcium: 9.7 mg/dL (ref 8.4–10.5)
Chloride: 101 meq/L (ref 96–112)
Creatinine, Ser: 0.96 mg/dL (ref 0.40–1.50)
GFR: 84.62 mL/min
Glucose, Bld: 171 mg/dL — ABNORMAL HIGH (ref 70–99)
Potassium: 4.6 meq/L (ref 3.5–5.1)
Sodium: 139 meq/L (ref 135–145)
Total Bilirubin: 0.4 mg/dL (ref 0.2–1.2)
Total Protein: 6.5 g/dL (ref 6.0–8.3)

## 2025-01-09 LAB — CBC WITH DIFFERENTIAL/PLATELET
Basophils Absolute: 0 K/uL (ref 0.0–0.1)
Basophils Relative: 0.4 % (ref 0.0–3.0)
Eosinophils Absolute: 0.1 K/uL (ref 0.0–0.7)
Eosinophils Relative: 3.4 % (ref 0.0–5.0)
HCT: 44.5 % (ref 39.0–52.0)
Hemoglobin: 14.2 g/dL (ref 13.0–17.0)
Lymphocytes Relative: 45.7 % (ref 12.0–46.0)
Lymphs Abs: 2 K/uL (ref 0.7–4.0)
MCHC: 32 g/dL (ref 30.0–36.0)
MCV: 77.5 fl — ABNORMAL LOW (ref 78.0–100.0)
Monocytes Absolute: 0.4 K/uL (ref 0.1–1.0)
Monocytes Relative: 8 % (ref 3.0–12.0)
Neutro Abs: 1.9 K/uL (ref 1.4–7.7)
Neutrophils Relative %: 42.5 % — ABNORMAL LOW (ref 43.0–77.0)
Platelets: 178 K/uL (ref 150.0–400.0)
RBC: 5.74 Mil/uL (ref 4.22–5.81)
RDW: 17.1 % — ABNORMAL HIGH (ref 11.5–15.5)
WBC: 4.4 K/uL (ref 4.0–10.5)

## 2025-01-09 LAB — LIPID PANEL
Cholesterol: 238 mg/dL — ABNORMAL HIGH (ref 28–200)
HDL: 33 mg/dL — ABNORMAL LOW
NonHDL: 205.09
Total CHOL/HDL Ratio: 7
Triglycerides: 659 mg/dL — ABNORMAL HIGH (ref 10.0–149.0)
VLDL: 131.8 mg/dL — ABNORMAL HIGH (ref 0.0–40.0)

## 2025-01-09 LAB — MICROALBUMIN / CREATININE URINE RATIO
Creatinine,U: 53.4 mg/dL
Microalb Creat Ratio: UNDETERMINED mg/g (ref 0.0–30.0)
Microalb, Ur: <0.7 mg/dL

## 2025-01-09 LAB — PSA: PSA: 2.28 ng/mL (ref 0.10–4.00)

## 2025-01-09 LAB — HEMOGLOBIN A1C: Hgb A1c MFr Bld: 9.6 % — ABNORMAL HIGH (ref 4.6–6.5)

## 2025-01-09 LAB — LDL CHOLESTEROL, DIRECT: Direct LDL: 117 mg/dL

## 2025-01-11 ENCOUNTER — Other Ambulatory Visit (HOSPITAL_COMMUNITY): Payer: Self-pay

## 2025-01-11 ENCOUNTER — Ambulatory Visit: Payer: Self-pay | Admitting: Medical

## 2025-01-11 ENCOUNTER — Encounter (HOSPITAL_COMMUNITY): Payer: Self-pay | Admitting: Pharmacist

## 2025-01-11 MED ORDER — OZEMPIC (0.25 OR 0.5 MG/DOSE) 2 MG/3ML ~~LOC~~ SOPN
0.2500 mg | PEN_INJECTOR | SUBCUTANEOUS | 0 refills | Status: AC
  Filled 2025-01-11: qty 3, 30d supply, fill #0

## 2025-01-11 NOTE — Addendum Note (Signed)
 Addended by: DORINA DALLAS DORINA PA-C M on: 01/11/2025 06:14 AM   Modules accepted: Orders

## 2025-01-14 ENCOUNTER — Other Ambulatory Visit (HOSPITAL_COMMUNITY): Payer: Self-pay

## 2025-01-14 ENCOUNTER — Other Ambulatory Visit: Payer: Self-pay

## 2025-01-15 ENCOUNTER — Other Ambulatory Visit (HOSPITAL_COMMUNITY): Payer: Self-pay

## 2025-01-22 ENCOUNTER — Ambulatory Visit: Admitting: Podiatry

## 2025-01-22 DIAGNOSIS — M21961 Unspecified acquired deformity of right lower leg: Secondary | ICD-10-CM

## 2025-01-22 DIAGNOSIS — B351 Tinea unguium: Secondary | ICD-10-CM

## 2025-01-22 NOTE — Progress Notes (Signed)
"  °  Subjective:  Patient ID: Thomas Yoder, male    DOB: 01-08-1961,  MRN: [id number]  Chief Complaint  Patient presents with   Diabetes    Nail trim   64 y.o. male returns for the above complaint.  Patient presents with thickened and [place] dystrophic mycotic toenails x 10 mild pain on palpation arch with ambulation and shoe pressure he would like to have a debride down he also is a diabetic with uncontrolled A1c.  He would like to discuss orthotics does not wear any orthotics denies any other acute complaints  Objective:  There were no vitals filed for this visit. Podiatric Exam: Vascular: dorsalis pedis and posterior tibial pulses are palpable bilateral. Capillary return is immediate. Temperature gradient is WNL. Skin turgor WNL  Sensorium: Normal Semmes Weinstein monofilament test. Normal tactile sensation bilaterally. Nail Exam: Pt has thick disfigured discolored nails with subungual debris noted bilateral entire nail hallux through fifth toenails.  Pain on palpation to the nails. Ulcer Exam: There is no evidence of ulcer or pre-ulcerative changes or infection. Orthopedic Exam: Muscle tone and strength are WNL. No limitations in general ROM. No crepitus or effusions noted.  Pes planovalgus foot structure noted calcaneovalgus to many toe signs partially but recreate the arch with dorsiflexion of the hallux unable to perform single and double heel raise Skin: No Porokeratosis. No infection or ulcers    Assessment & Plan:   1. Foot deformity, bilateral   2. Pain due to onychomycosis of toenails of both feet     Patient was evaluated and treated and all questions answered.  Pes planovalgus/foot deformity -I explained to patient the etiology of pes planovalgus and relationship with heel pain/arch pain and various treatment options were discussed.  Given patient foot structure in the setting of heel pain/arch pain I believe patient will benefit from custom-made orthotics to help control  the hindfoot motion support the arch of the foot and take the stress away from arches.  Patient agrees with the plan like to proceed with orthotics -Patient was casted for orthotics .  Onychomycosis with pain  -Nails palliatively debrided as below. -Educated on self-care  Procedure: Nail Debridement Rationale: pain  Type of Debridement: manual, sharp debridement. Instrumentation: Nail nipper, rotary burr. Number of Nails: 10  Procedures and Treatment: Consent by patient was obtained for treatment procedures. The patient understood the discussion of treatment and procedures well. All questions were answered thoroughly reviewed. Debridement of mycotic and hypertrophic toenails, 1 through 5 bilateral and clearing of subungual debris. No ulceration, no infection noted.  Return Visit-Office Procedure: Patient instructed to return to the office for a follow up visit 3 months for continued evaluation and treatment.  Franky Blanch, DPM    No follow-ups on file.  "

## 2025-02-27 ENCOUNTER — Ambulatory Visit: Payer: Self-pay | Admitting: Primary Care

## 2025-09-29 ENCOUNTER — Ambulatory Visit: Payer: Self-pay | Admitting: Physician Assistant

## 2026-01-09 ENCOUNTER — Encounter: Admitting: Medical
